# Patient Record
Sex: Female | Born: 1970 | Race: White | Hispanic: No | Smoking: Never smoker
Health system: Southern US, Community
[De-identification: ages and names within clinical notes are randomized; demographics above are authoritative.]

## PROBLEM LIST (undated history)

## (undated) DIAGNOSIS — F32A Depression, unspecified: Secondary | ICD-10-CM

## (undated) DIAGNOSIS — M1990 Unspecified osteoarthritis, unspecified site: Secondary | ICD-10-CM

## (undated) DIAGNOSIS — M797 Fibromyalgia: Secondary | ICD-10-CM

## (undated) DIAGNOSIS — K219 Gastro-esophageal reflux disease without esophagitis: Secondary | ICD-10-CM

## (undated) DIAGNOSIS — K589 Irritable bowel syndrome without diarrhea: Secondary | ICD-10-CM

## (undated) DIAGNOSIS — C50919 Malignant neoplasm of unspecified site of unspecified female breast: Secondary | ICD-10-CM

## (undated) DIAGNOSIS — R51 Headache: Secondary | ICD-10-CM

## (undated) DIAGNOSIS — R519 Headache, unspecified: Secondary | ICD-10-CM

## (undated) DIAGNOSIS — T7840XA Allergy, unspecified, initial encounter: Secondary | ICD-10-CM

## (undated) DIAGNOSIS — F329 Major depressive disorder, single episode, unspecified: Secondary | ICD-10-CM

## (undated) DIAGNOSIS — N942 Vaginismus: Secondary | ICD-10-CM

## (undated) DIAGNOSIS — F419 Anxiety disorder, unspecified: Secondary | ICD-10-CM

## (undated) DIAGNOSIS — G8929 Other chronic pain: Secondary | ICD-10-CM

## (undated) HISTORY — PX: ENDOMETRIAL ABLATION: SHX621

## (undated) HISTORY — DX: Irritable bowel syndrome, unspecified: K58.9

## (undated) HISTORY — DX: Depression, unspecified: F32.A

## (undated) HISTORY — DX: Major depressive disorder, single episode, unspecified: F32.9

## (undated) HISTORY — DX: Headache, unspecified: R51.9

## (undated) HISTORY — PX: TONSILLECTOMY: SHX5217

## (undated) HISTORY — DX: Fibromyalgia: M79.7

## (undated) HISTORY — DX: Unspecified osteoarthritis, unspecified site: M19.90

## (undated) HISTORY — PX: TUBAL LIGATION: SHX77

## (undated) HISTORY — PX: UPPER GASTROINTESTINAL ENDOSCOPY: SHX188

## (undated) HISTORY — PX: TONSILLECTOMY: SUR1361

## (undated) HISTORY — DX: Vaginismus: N94.2

## (undated) HISTORY — PX: ESOPHAGOGASTRODUODENOSCOPY: SHX1529

## (undated) HISTORY — DX: Anxiety disorder, unspecified: F41.9

## (undated) HISTORY — DX: Gastro-esophageal reflux disease without esophagitis: K21.9

## (undated) HISTORY — DX: Allergy, unspecified, initial encounter: T78.40XA

## (undated) HISTORY — DX: Headache: R51

## (undated) HISTORY — PX: ANKLE SURGERY: SHX546

## (undated) HISTORY — DX: Malignant neoplasm of unspecified site of unspecified female breast: C50.919

## (undated) HISTORY — DX: Other chronic pain: G89.29

---

## 2004-07-25 DIAGNOSIS — M797 Fibromyalgia: Secondary | ICD-10-CM

## 2004-07-25 HISTORY — DX: Fibromyalgia: M79.7

## 2005-11-01 ENCOUNTER — Ambulatory Visit (HOSPITAL_COMMUNITY): Admission: RE | Admit: 2005-11-01 | Discharge: 2005-11-01 | Payer: Self-pay | Admitting: Orthopedic Surgery

## 2009-04-03 ENCOUNTER — Encounter: Payer: Self-pay | Admitting: Family Medicine

## 2009-10-05 ENCOUNTER — Encounter: Payer: Self-pay | Admitting: Family Medicine

## 2009-12-11 ENCOUNTER — Encounter: Payer: Self-pay | Admitting: Family Medicine

## 2010-01-13 ENCOUNTER — Ambulatory Visit: Payer: Self-pay | Admitting: Family Medicine

## 2010-01-13 DIAGNOSIS — M797 Fibromyalgia: Secondary | ICD-10-CM

## 2010-01-13 DIAGNOSIS — K219 Gastro-esophageal reflux disease without esophagitis: Secondary | ICD-10-CM

## 2010-01-20 ENCOUNTER — Telehealth: Payer: Self-pay | Admitting: Family Medicine

## 2010-01-20 DIAGNOSIS — K3189 Other diseases of stomach and duodenum: Secondary | ICD-10-CM

## 2010-01-20 DIAGNOSIS — R1013 Epigastric pain: Secondary | ICD-10-CM

## 2010-01-21 ENCOUNTER — Encounter: Payer: Self-pay | Admitting: Family Medicine

## 2010-01-22 ENCOUNTER — Telehealth: Payer: Self-pay | Admitting: Family Medicine

## 2010-01-22 LAB — CONVERTED CEMR LAB
AST: 16 units/L (ref 0–37)
Alkaline Phosphatase: 41 units/L (ref 39–117)
BUN: 10 mg/dL (ref 6–23)
Basophils Absolute: 0 10*3/uL (ref 0.0–0.1)
Basophils Relative: 1 % (ref 0–1)
CO2: 25 meq/L (ref 19–32)
Chloride: 103 meq/L (ref 96–112)
Eosinophils Absolute: 0.1 10*3/uL (ref 0.0–0.7)
HCT: 41 % (ref 36.0–46.0)
Lipase: 32 units/L (ref 0–75)
Lymphocytes Relative: 29 % (ref 12–46)
Lymphs Abs: 1.4 10*3/uL (ref 0.7–4.0)
Monocytes Absolute: 0.6 10*3/uL (ref 0.1–1.0)
Monocytes Relative: 11 % (ref 3–12)
Sodium: 138 meq/L (ref 135–145)
Total Bilirubin: 0.4 mg/dL (ref 0.3–1.2)
WBC: 4.9 10*3/uL (ref 4.0–10.5)

## 2010-02-03 ENCOUNTER — Encounter: Payer: Self-pay | Admitting: Family Medicine

## 2010-02-16 ENCOUNTER — Telehealth: Payer: Self-pay | Admitting: Family Medicine

## 2010-04-12 ENCOUNTER — Ambulatory Visit: Payer: Self-pay | Admitting: Family Medicine

## 2010-04-28 ENCOUNTER — Telehealth (INDEPENDENT_AMBULATORY_CARE_PROVIDER_SITE_OTHER): Payer: Self-pay | Admitting: *Deleted

## 2010-05-21 ENCOUNTER — Ambulatory Visit: Payer: Self-pay | Admitting: Family Medicine

## 2010-05-21 DIAGNOSIS — J45909 Unspecified asthma, uncomplicated: Secondary | ICD-10-CM | POA: Insufficient documentation

## 2010-05-21 DIAGNOSIS — J209 Acute bronchitis, unspecified: Secondary | ICD-10-CM

## 2010-05-28 ENCOUNTER — Ambulatory Visit: Payer: Self-pay | Admitting: Family Medicine

## 2010-05-28 DIAGNOSIS — J0100 Acute maxillary sinusitis, unspecified: Secondary | ICD-10-CM

## 2010-06-29 ENCOUNTER — Ambulatory Visit: Payer: Self-pay | Admitting: Family Medicine

## 2010-06-29 DIAGNOSIS — J45909 Unspecified asthma, uncomplicated: Secondary | ICD-10-CM | POA: Insufficient documentation

## 2010-08-23 ENCOUNTER — Telehealth (INDEPENDENT_AMBULATORY_CARE_PROVIDER_SITE_OTHER): Payer: Self-pay | Admitting: *Deleted

## 2010-08-24 NOTE — Letter (Signed)
Summary: Page 3 & 4 of 5/High [facility]  Page 3 & 4 of 5/High [facility]   Imported By: Lanelle Bal 01/27/2010 10:23:46  _____________________________________________________________________  External Attachment:    Type:   Image     Comment:   External Document

## 2010-08-24 NOTE — Assessment & Plan Note (Signed)
Summary: NOV fibromyalgia   Vital Signs:  Patient profile:   40 year old female Height:      64 inches Weight:      127 pounds BMI:     21.88 O2 Sat:      100 % on Room air Pulse rate:   91 / minute BP sitting:   102 / 65  (left arm) Cuff size:   regular  Vitals Entered By: Payton Spark CMA (January 13, 2010 1:48 PM)  O2 Flow:  Room air CC: New to est. Discuss fibromyalgia and acid reflux.    Primary Care Provider:  Seymour Bars DO  CC:  New to est. Discuss fibromyalgia and acid reflux. Marland Kitchen  History of Present Illness: 40 yo WF presents for NOV.  She has a hx of fibromyalga since 2006.  She is G3P2-- sees Dr Rito Ehrlich.  She had an enodmetrial ablation and a BTL.    She is still having muscle pain and fatigue.  Cymbalta 90 mg has helped her depression.  She has muscle pain from head to toe.  She is getting some sleep disruption with young kids.  She has tried Tylenol and Ibuprofen but it does not help.  In the past, she has seen a chiropractor, it has helped.  She tries to walk but has no time due to kids's schedules.    Current Medications (verified): 1)  Trazodone Hcl 100 Mg Tabs (Trazodone Hcl) .... Take 1 Tab By Mouth Once Daily 2)  Clonazepam 1 Mg Tabs (Clonazepam) .... Take 1/2 To 1 Tab By Mouth Once Daily 3)  Zyrtec-D Allergy & Congestion 5-120 Mg Xr12h-Tab (Cetirizine-Pseudoephedrine) .... Take 1 Tab By Mouth Once Daily 4)  Lansoprazole 15 Mg Cpdr (Lansoprazole) .... Take 2 Tabs By Mouth Once Daily 5)  Cymbalta 30 Mg Cpep (Duloxetine Hcl) .... Take 3 Caps By Mouth Once Daily 6)  Excedrin Tension Headache 500-65 Mg Tabs (Acetaminophen-Caffeine) .... Take 2 Tabs By Mouth Once Daily 7)  Vitamin D3 5000 Unit/ml Liqd (Cholecalciferol) .... Take 1 Tab By Mouth Once Daily 8)  Calcium 600 Mg Tabs (Calcium) .... Take 3 Tab By Mouth Once Daily 9)  Pulmicort Flexhaler 180 Mcg/act Aepb (Budesonide) .... Take 4 Puffs Daily 10)  Xopenex 0.63 Mg/60ml Nebu (Levalbuterol Hcl) 11)   Fluticasone Propionate 50 Mcg/act Susp (Fluticasone Propionate) .Marland Kitchen.. 1 Spray Per Nostril Two Times A Day 12)  Vitamin B-12 100 Mcg Tabs (Cyanocobalamin)  Allergies (verified): 1)  ! Zithromax 2)  ! Ambien (Zolpidem Tartrate)  Past History:  Past Medical History: fibromyalgia G3P2  Past Surgical History: BTL endometrial ablation Dr Val EagleNathaneil Canary tonsillectomy LTCS x 2  Family History: mother depression, HTN father healthy 1 brother, 3 sisters healthy  Social History: Married.  2 daughters. [employer]- education. Never smoked. Exercises some. Denies ETOH.  Review of Systems       no fevers/sweats/weakness, unexplained wt loss/gain, no change in vision, no difficulty hearing, ringing in ears, + hay fever/allergies, no CP/discomfort, no palpitations, no breast lump/nipple discharge, no cough/wheeze, no blood in stool, no N/V/D, no nocturia, no leaking urine, no unusual vag bleeding, no vaginal/penile discharge, + muscle/joint pain, no rash, no new/changing mole, no HA, no memory loss, no anxiety, no sleep problem, no depression, no unexplained lumps, no easy bruising/bleeding, no concern with sexual function   Physical Exam  General:  alert, well-developed, well-nourished, and well-hydrated.   Head:  normocephalic and atraumatic.   Mouth:  good dentition and pharynx pink and moist.  Neck:  no masses.   Lungs:  Normal respiratory effort, chest expands symmetrically. Lungs are clear to auscultation, no crackles or wheezes. Heart:  Normal rate and regular rhythm. S1 and S2 normal without gallop, murmur, click, rub or other extra sounds. Msk:  no joint tenderness, no joint swelling, no joint warmth, and no redness over joints.   Extremities:  no E/C/C Skin:  color normal.   Psych:  good eye contact, not anxious appearing, and flat affect.     Impression & Recommendations:  Problem # 1:  FIBROMYALGIA (ICD-729.1) Continue Cymbalta 90 mg/ day.  Work on improving regular exercise.   Continue PT/chiropractic treatments as needed.  Add Skelaxin to Tylenol as needed for myalgias.  Discussed healthy diet and improving sleep as part of the treatment.  RTC in 3 mos for f/u Her updated medication list for this problem includes:    Excedrin Tension Headache 500-65 Mg Tabs (Acetaminophen-caffeine) .Marland Kitchen... Take 2 tabs by mouth once daily    Skelaxin 800 Mg Tabs (Metaxalone) .Marland Kitchen... 1 tab by mouth three times a day as needed muscle pain  Problem # 2:  GERD (ICD-530.81)  Her updated medication list for this problem includes:    Dexilant 60 Mg Cpdr (Dexlansoprazole) .Marland Kitchen... 1 capsule by mouth daily  Complete Medication List: 1)  Trazodone Hcl 100 Mg Tabs (Trazodone hcl) .... Take 1 tab by mouth once daily 2)  Clonazepam 1 Mg Tabs (Clonazepam) .... Take 1/2 to 1 tab by mouth once daily 3)  Zyrtec-d Allergy & Congestion 5-120 Mg Xr12h-tab (Cetirizine-pseudoephedrine) .... Take 1 tab by mouth once daily 4)  Dexilant 60 Mg Cpdr (Dexlansoprazole) .Marland Kitchen.. 1 capsule by mouth daily 5)  Cymbalta 30 Mg Cpep (Duloxetine hcl) .... Take 3 caps by mouth once daily 6)  Excedrin Tension Headache 500-65 Mg Tabs (Acetaminophen-caffeine) .... Take 2 tabs by mouth once daily 7)  Vitamin D3 5000 Unit/ml Liqd (Cholecalciferol) .... Take 1 tab by mouth once daily 8)  Calcium 600 Mg Tabs (Calcium) .... Take 3 tab by mouth once daily 9)  Pulmicort Flexhaler 180 Mcg/act Aepb (Budesonide) .... Take 4 puffs daily 10)  Xopenex 0.63 Mg/38ml Nebu (Levalbuterol hcl) 11)  Fluticasone Propionate 50 Mcg/act Susp (Fluticasone propionate) .Marland Kitchen.. 1 spray per nostril two times a day 12)  Vitamin B-12 100 Mcg Tabs (Cyanocobalamin) 13)  Skelaxin 800 Mg Tabs (Metaxalone) .Marland Kitchen.. 1 tab by mouth three times a day as needed muscle pain  Patient Instructions: 1)  For fibromyalgia, 2)  Stay on 90 mg of Cymbalta daily. 3)  Add Skelaxin ( non - sedating Muscle relaxer) up to 3 x a day for muscle pain. 4)  Use Tylenol Extra Strength up to  1000 mg 3 x a day as needed. 5)  Increase regular exercise to 30+ min most days/ wk. 6)  Change Lansoprazole to Dexilant for acid reflux. 7)  Return for f/u fibromyalgia in 3 mos. Prescriptions: DEXILANT 60 MG CPDR (DEXLANSOPRAZOLE) 1 capsule by mouth daily  #30 x 3   Entered and Authorized by:   Seymour Bars DO   Signed by:   Seymour Bars DO on 01/13/2010   Method used:   Electronically to        [facility] [phone]* (retail)       [address] [place], [place]  [address]       Ph: [phone] or [phone]       Fax: [phone]   RxID:  [id number] SKELAXIN 800 MG TABS (METAXALONE) 1 tab by mouth three times a day as needed muscle pain  #90 x 2   Entered and Authorized by:   Seymour Bars DO   Signed by:   Seymour Bars DO on 01/13/2010   Method used:   Electronically to        [facility] [phone]* (retail)       [address] [place], [place]  [address]       Ph: [phone] or [phone]       Fax: [phone]   RxID:   [id number]

## 2010-08-24 NOTE — Letter (Signed)
Summary: [facility]   Imported By: Lanelle Bal 01/27/2010 10:22:37  _____________________________________________________________________  External Attachment:    Type:   Image     Comment:   External Document

## 2010-08-24 NOTE — Assessment & Plan Note (Signed)
Summary: allergic asthma   Vital Signs:  Patient profile:   40 year old female Height:      64 inches Weight:      126 pounds BMI:     21.71 O2 Sat:      100 % on Room air Temp:     98.4 degrees F oral Pulse rate:   79 / minute BP sitting:   109 / 71  (left arm) Cuff size:   regular  Vitals Entered By: Payton Spark CMA (June 29, 2010 3:47 PM)  O2 Flow:  Room air  Serial Vital Signs/Assessments:                                PEF    PreRx  PostRx Time      O2 Sat  O2 Type     L/min  L/min  L/min   By 4:04 PM                       600    590    600     Payton Spark CMA  Comments: 4:04 PM Pt in green zone By: Payton Spark CMA   CC: ? congestion and cough.    Primary Care Provider:  Seymour Bars DO  CC:  ? congestion and cough. .  History of Present Illness: 40 yo WF presents for cough that has lingered after she was seen and treated for bronchitis a month ago.  She finished a full 10 day course of antibiotics and felt like she improved but her cough never did resolve.  She has a long hx of allergies - on Allegra D daily and asthma - on Pulmicort and using Albuterol HFA 4 x day with short term relief.  She is a Manufacturing systems engineer.  Her cough is mostly dry and not keeping her up at night.  Denies chest tightness, wheezing or SOB.  Denies any more rhinorrhea or sinus congestion.  She sees an allergist in [place].      Current Medications (verified): 1)  Trazodone Hcl 100 Mg Tabs (Trazodone Hcl) .... 2 Tabs By Mouth Qhs 2)  Allegra-D Allergy & Congestion 60-120 Mg Xr12h-Tab (Fexofenadine-Pseudoephedrine) .... Take One Tablet By Mouth Once A Day 3)  Lansoprazole 30 Mg Cpdr (Lansoprazole) .Marland Kitchen.. 1 Tab By Mouth Bid 4)  Cymbalta 30 Mg Cpep (Duloxetine Hcl) .... Take 3 Caps By Mouth Once Daily 5)  Vitamin D3 5000 Unit/ml Liqd (Cholecalciferol) .... Take 1 Tab By Mouth Once Daily 6)  Calcium 600 Mg Tabs (Calcium) .... Take 3 Tab By Mouth Once Daily 7)  Pulmicort Flexhaler 180  Mcg/act Aepb (Budesonide) .... Take 4 Puffs Daily 8)  Xopenex 0.63 Mg/44ml Nebu (Levalbuterol Hcl) 9)  Fluticasone Propionate 50 Mcg/act Susp (Fluticasone Propionate) .Marland Kitchen.. 1 Spray Per Nostril Two Times A Day 10)  Vitamin B-12 100 Mcg Tabs (Cyanocobalamin) 11)  Miralax  Powd (Polyethylene Glycol 3350) .... Use As Directed Per Bottle 12)  Promethazine-Codeine 6.25-10 Mg/106ml Syrp (Promethazine-Codeine) .... 5 Ml By Mouth Q 6 Hrs As Needed Cough 13)  Amoxicillin 875 Mg Tabs (Amoxicillin) .Marland Kitchen.. 1 Tab By Mouth Two Times A Day X 10 Days; Take With Food  Allergies (verified): 1)  ! Zithromax 2)  ! Ambien (Zolpidem Tartrate)  Past History:  Past Medical History: fibromyalgia G3P2 allergies/ asthma  allergist in [place]  Past Surgical History: Reviewed history from 01/13/2010 and no  changes required. BTL endometrial ablation Dr Val Eagle' Nathaneil Canary tonsillectomy LTCS x 2  Social History: Reviewed history from 04/12/2010 and no changes required. Married.  2 daughters. [employer]- education- teaching.  Never smoked. Exercises some. Denies ETOH.  Review of Systems      See HPI  Physical Exam  General:  alert, well-developed, well-nourished, and well-hydrated.   Head:  normocephalic and atraumatic.  sinuses NTTP Eyes:  conjunctiva clear, slightly watery Ears:  EACs patent; TMs translucent and gray with good cone of light and bony landmarks.  Nose:  scant clear rhinorrhea Mouth:  o/p clear with postnasal drainage Neck:  no masses.   Lungs:  Normal respiratory effort, chest expands symmetrically. Lungs are clear to auscultation, no crackles or wheezes.  dry cough Heart:  Normal rate and regular rhythm. S1 and S2 normal without gallop, murmur, click, rub or other extra sounds. Skin:  color normal.   Cervical Nodes:  No lymphadenopathy noted   Impression & Recommendations:  Problem # 1:  ALLERGIC ASTHMA (ICD-493.00)  Continued cough after roiund of abx last month along with URI/ bronchitis.  She is  DUE to go back to her allergist -- she will call to schedule this.  Her lung exam is clear and her PFs are normal but she has a lot of postnasal drip even with use of Allegra D.  She is to stay on her current asthma meds but will add 5 days of Prednsione burst to see if this helps w/ her cough.  She can use her RX cough syrup at night. Her updated medication list for this problem includes:    Pulmicort Flexhaler 180 Mcg/act Aepb (Budesonide) .Marland Kitchen... Take 4 puffs daily    Xopenex 0.63 Mg/20ml Nebu (Levalbuterol hcl)    Prednisone 20 Mg Tabs (Prednisone) .Marland Kitchen... 2 tabs by mouth once daily x 5 days  Orders: Peak Flow Rate (94150)  Complete Medication List: 1)  Trazodone Hcl 100 Mg Tabs (Trazodone hcl) .... 2 tabs by mouth qhs 2)  Allegra-d Allergy & Congestion 60-120 Mg Xr12h-tab (Fexofenadine-pseudoephedrine) .... Take one tablet by mouth once a day 3)  Lansoprazole 30 Mg Cpdr (Lansoprazole) .Marland Kitchen.. 1 tab by mouth bid 4)  Cymbalta 30 Mg Cpep (Duloxetine hcl) .... Take 3 caps by mouth once daily 5)  Vitamin D3 5000 Unit/ml Liqd (Cholecalciferol) .... Take 1 tab by mouth once daily 6)  Calcium 600 Mg Tabs (Calcium) .... Take 3 tab by mouth once daily 7)  Pulmicort Flexhaler 180 Mcg/act Aepb (Budesonide) .... Take 4 puffs daily 8)  Xopenex 0.63 Mg/40ml Nebu (Levalbuterol hcl) 9)  Fluticasone Propionate 50 Mcg/act Susp (Fluticasone propionate) .Marland Kitchen.. 1 spray per nostril two times a day 10)  Vitamin B-12 100 Mcg Tabs (Cyanocobalamin) 11)  Miralax Powd (Polyethylene glycol 3350) .... Use as directed per bottle 12)  Promethazine-codeine 6.25-10 Mg/59ml Syrp (Promethazine-codeine) .... 5 ml by mouth q 6 hrs as needed cough 13)  Prednisone 20 Mg Tabs (Prednisone) .... 2 tabs by mouth once daily x 5 days  Patient Instructions: 1)  Will treat allergic asthma with 5 days of Prednisone 2 tabs once daily. 2)  Stay on Pulmicort + rescue inhaler. 3)  Will get you back in with your allergist in the next  week. Prescriptions: PREDNISONE 20 MG TABS (PREDNISONE) 2 tabs by mouth once daily x 5 days  #10 x 0   Entered and Authorized by:   Seymour Bars DO   Signed by:   Seymour Bars DO on 06/29/2010   Method  used:   Electronically to        [facility]* (retail)       [address]       [place], [place]  [address]       Ph: [phone]       Fax: [phone]   RxID:   [id number] PROMETHAZINE-CODEINE 6.25-10 MG/5ML SYRP (PROMETHAZINE-CODEINE) 5 ml by mouth q 6 hrs as needed cough  #120 ml x 0   Entered and Authorized by:   Seymour Bars DO   Signed by:   Seymour Bars DO on 06/29/2010   Method used:   Printed then faxed to ...       [facility]* (retail)       [address]       [place], [place]  [address]       Ph: [phone]       Fax: [phone]   RxID:   [id number]    Orders Added: 1)  Est. Patient Level III [32355] 2)  Peak Flow Rate [94150]

## 2010-08-24 NOTE — Assessment & Plan Note (Signed)
Summary: BRONCHITIS   Vital Signs:  Patient profile:   40 year old female Height:      64 inches Weight:      126 pounds Temp:     98.2 degrees F oral Pulse rate:   75 / minute BP sitting:   110 / 74  (right arm) Cuff size:   regular  Vitals Entered By: Avon Gully CMA, Duncan Dull) (May 21, 2010 10:47 AM)  Serial Vital Signs/Assessments:  Comments: 10:50 AM pre med peak flows 500, 450, 480 By: Avon Gully CMA, (AAMA)  10:50 AM pt is in the green By: Avon Gully CMA, (AAMA)   CC: chest feels tight,coughing until ribs hurt, getting worse over the past week   Primary Care Provider:  Seymour Bars DO  CC:  chest feels tight, coughing until ribs hurt, and getting worse over the past week.  History of Present Illness: chest feels tight,coughing until ribs hurt, getting worse over the past week. . Started a week a fever.  Started with ST.  No fever. Nasal drainage only for 2 days.  Hurts to breath. Chest feels itchy and sore.  No sig nasal congestion.  Using mucinex and robitussin for cough supresssion. Hx of asthma. Using her rescue inhaler 2-3 x a day.    Current Medications (verified): 1)  Trazodone Hcl 100 Mg Tabs (Trazodone Hcl) .... 2 Tabs By Mouth Qhs 2)  Allegra-D Allergy & Congestion 60-120 Mg Xr12h-Tab (Fexofenadine-Pseudoephedrine) .... Take One Tablet By Mouth Once A Day 3)  Lansoprazole 30 Mg Cpdr (Lansoprazole) .Marland Kitchen.. 1 Tab By Mouth Bid 4)  Cymbalta 30 Mg Cpep (Duloxetine Hcl) .... Take 3 Caps By Mouth Once Daily 5)  Vitamin D3 5000 Unit/ml Liqd (Cholecalciferol) .... Take 1 Tab By Mouth Once Daily 6)  Calcium 600 Mg Tabs (Calcium) .... Take 3 Tab By Mouth Once Daily 7)  Pulmicort Flexhaler 180 Mcg/act Aepb (Budesonide) .... Take 4 Puffs Daily 8)  Xopenex 0.63 Mg/83ml Nebu (Levalbuterol Hcl) 9)  Fluticasone Propionate 50 Mcg/act Susp (Fluticasone Propionate) .Marland Kitchen.. 1 Spray Per Nostril Two Times A Day 10)  Vitamin B-12 100 Mcg Tabs (Cyanocobalamin) 11)   Miralax  Powd (Polyethylene Glycol 3350) .... Use As Directed Per Bottle  Allergies (verified): 1)  ! Zithromax 2)  ! Ambien (Zolpidem Tartrate)  Comments:  Nurse/Medical Assistant: The patient's medications and allergies were reviewed with the patient and were updated in the Medication and Allergy Lists. Avon Gully CMA, Duncan Dull) (May 21, 2010 10:49 AM)  Physical Exam  General:  Well-developed,well-nourished,in no acute distress; alert,appropriate and cooperative throughout examination Head:  Normocephalic and atraumatic without obvious abnormalities. No apparent alopecia or balding. Eyes:  No corneal or conjunctival inflammation noted. EOMI. Perrla.  Ears:  External ear exam shows no significant lesions or deformities.  Otoscopic examination reveals clear canals, tympanic membranes are intact bilaterally without bulging, retraction, inflammation or discharge. Hearing is grossly normal bilaterally. Nose:  External nasal examination shows no deformity or inflammation. Nasal mucosa are pink and moist without lesions or exudates. Mouth:  Oral mucosa and oropharynx without lesions or exudates.  Teeth in good repair. Neck:  No deformities, masses, or tenderness noted.  Lungs:  Normal respiratory effort, chest expands symmetrically. Lungs are clear to auscultation, no crackles or wheezes. Cough is dry.   Heart:  Normal rate and regular rhythm. S1 and S2 normal without gallop, murmur, click, rub or other extra sounds. Pulses:  Radial 2+  Neurologic:  alert & oriented X3.   Skin:  no  rashes.   Cervical Nodes:  No lymphadenopathy noted   Impression & Recommendations:  Problem # 1:  ACUTE BRONCHITIS (ICD-466.0)  Her updated medication list for this problem includes:    Allegra-d Allergy & Congestion 60-120 Mg Xr12h-tab (Fexofenadine-pseudoephedrine) .Marland Kitchen... Take one tablet by mouth once a day    Pulmicort Flexhaler 180 Mcg/act Aepb (Budesonide) .Marland Kitchen... Take 4 puffs daily    Xopenex 0.63  Mg/78ml Nebu (Levalbuterol hcl)    Hydrocodone-homatropine 5-1.5 Mg/70ml Syrp (Hydrocodone-homatropine) .Marland KitchenMarland KitchenMarland KitchenMarland Kitchen 5ml by mouth at bedtime as needed cough  Discussed likely viral.  Tx is inhalers and time. call if gtting worse. Will add the steroids.  Encouraged to push clear liquids, get enough rest, and take acetaminophen as needed. To be seen in 5-7 days if no improvement, sooner if worse. Cough med to treat the cough at bedtime.   Problem # 2:  ASTHMA (ICD-493.90)  Trial of the dulera. If makes her too shakey like the Advair and symbicort do then can restart he pulmicort and start with the xopenex qid or three times a day and wean down as feeling better.  Her updated medication list for this problem includes:    Pulmicort Flexhaler 180 Mcg/act Aepb (Budesonide) .Marland Kitchen... Take 4 puffs daily    Xopenex 0.63 Mg/29ml Nebu (Levalbuterol hcl)    Prednisone 20 Mg Tabs (Prednisone) .Marland Kitchen... 2 tabs by mouth daily for 5 days then once daily for 5 days.  Orders: Peak Flow Rate (94150) Peak Flow Meter (Z6109)  Complete Medication List: 1)  Trazodone Hcl 100 Mg Tabs (Trazodone hcl) .... 2 tabs by mouth qhs 2)  Allegra-d Allergy & Congestion 60-120 Mg Xr12h-tab (Fexofenadine-pseudoephedrine) .... Take one tablet by mouth once a day 3)  Lansoprazole 30 Mg Cpdr (Lansoprazole) .Marland Kitchen.. 1 tab by mouth bid 4)  Cymbalta 30 Mg Cpep (Duloxetine hcl) .... Take 3 caps by mouth once daily 5)  Vitamin D3 5000 Unit/ml Liqd (Cholecalciferol) .... Take 1 tab by mouth once daily 6)  Calcium 600 Mg Tabs (Calcium) .... Take 3 tab by mouth once daily 7)  Pulmicort Flexhaler 180 Mcg/act Aepb (Budesonide) .... Take 4 puffs daily 8)  Xopenex 0.63 Mg/70ml Nebu (Levalbuterol hcl) 9)  Fluticasone Propionate 50 Mcg/act Susp (Fluticasone propionate) .Marland Kitchen.. 1 spray per nostril two times a day 10)  Vitamin B-12 100 Mcg Tabs (Cyanocobalamin) 11)  Miralax Powd (Polyethylene glycol 3350) .... Use as directed per bottle 12)  Hydrocodone-homatropine  5-1.5 Mg/35ml Syrp (Hydrocodone-homatropine) .... 5ml by mouth at bedtime as needed cough 13)  Prednisone 20 Mg Tabs (Prednisone) .... 2 tabs by mouth daily for 5 days then once daily for 5 days.  Patient Instructions: 1)  Increase albutreol inhaler to three times a day with your pulmicort or try the dulera. Will add steroids as well 2)  Call if not getting better in 5-7 days.  Prescriptions: PREDNISONE 20 MG TABS (PREDNISONE) 2 tabs by mouth daily for 5 days then once daily for 5 days.  #15 x 0   Entered and Authorized by:   Nani Gasser MD   Signed by:   Nani Gasser MD on 05/21/2010   Method used:   Printed then faxed to ...       [facility]* (retail)       [address]       [place], [place]  [address]       Ph: [phone]       Fax: [phone]   RxID:   [id number] HYDROCODONE-HOMATROPINE 5-1.5  MG/5ML SYRP (HYDROCODONE-HOMATROPINE) 5ml by mouth at bedtime as needed cough  #175ml x 0   Entered and Authorized by:   Nani Gasser MD   Signed by:   Nani Gasser MD on 05/21/2010   Method used:   Printed then faxed to ...       [facility]* (retail)       [address]       [place], [place]  [address]       Ph: [phone]       Fax: [phone]   RxID:   [id number]    Orders Added: 1)  Est. Patient Level IV [84132] 2)  Peak Flow Rate [94150] 3)  Peak Flow Meter [A4614]  Appended Document: BRONCHITIS PE: mild anterior tender cerv LN.

## 2010-08-24 NOTE — Assessment & Plan Note (Signed)
Summary: sinusitis/ asthma   Vital Signs:  Patient profile:   40 year old female Height:      64 inches Weight:      126 pounds BMI:     21.71 O2 Sat:      99 % on Room air Temp:     98.5 degrees F oral Pulse rate:   83 / minute BP sitting:   116 / 66  (left arm) Cuff size:   regular  Vitals Entered By: Payton Spark CMA (May 28, 2010 1:56 PM)  O2 Flow:  Room air CC: Bronchitis and sinusitis.    Primary Care Provider:  Seymour Bars DO  CC:  Bronchitis and sinusitis. Marland Kitchen  History of Present Illness: 40 yo WF presents for post viral cough with hx of asthma.  She completed her steroids and was too shakey on Dulera so is back on Pulmicort with use of Xopenex 3 x a day.  She has yearlong allergies, on Allegra D everyday, year- long.  Her cough is still dry and hacking.  Has chest pain with cough.  Has more head congestion.  No fevers or chills.  energy level is improving.  She has used robitussin during the day and Hycodan at night -- not really working well for her es/p during the day.    She denies chest tightness or SOB other than with cough.  Has started to feel a little better but has sinus pressure esp when bending over and has increased PND.   Current Medications (verified): 1)  Trazodone Hcl 100 Mg Tabs (Trazodone Hcl) .... 2 Tabs By Mouth Qhs 2)  Allegra-D Allergy & Congestion 60-120 Mg Xr12h-Tab (Fexofenadine-Pseudoephedrine) .... Take One Tablet By Mouth Once A Day 3)  Lansoprazole 30 Mg Cpdr (Lansoprazole) .Marland Kitchen.. 1 Tab By Mouth Bid 4)  Cymbalta 30 Mg Cpep (Duloxetine Hcl) .... Take 3 Caps By Mouth Once Daily 5)  Vitamin D3 5000 Unit/ml Liqd (Cholecalciferol) .... Take 1 Tab By Mouth Once Daily 6)  Calcium 600 Mg Tabs (Calcium) .... Take 3 Tab By Mouth Once Daily 7)  Pulmicort Flexhaler 180 Mcg/act Aepb (Budesonide) .... Take 4 Puffs Daily 8)  Xopenex 0.63 Mg/32ml Nebu (Levalbuterol Hcl) 9)  Fluticasone Propionate 50 Mcg/act Susp (Fluticasone Propionate) .Marland Kitchen.. 1 Spray  Per Nostril Two Times A Day 10)  Vitamin B-12 100 Mcg Tabs (Cyanocobalamin) 11)  Miralax  Powd (Polyethylene Glycol 3350) .... Use As Directed Per Bottle 12)  Hydrocodone-Homatropine 5-1.5 Mg/20ml Syrp (Hydrocodone-Homatropine) .... 5ml By Mouth At Bedtime As Needed Cough 13)  Prednisone 20 Mg Tabs (Prednisone) .... 2 Tabs By Mouth Daily For 5 Days Then Once Daily For 5 Days.  Allergies (verified): 1)  ! Zithromax 2)  ! Ambien (Zolpidem Tartrate)  Past History:  Past Medical History: Reviewed history from 01/13/2010 and no changes required. fibromyalgia G3P2  Past Surgical History: Reviewed history from 01/13/2010 and no changes required. BTL endometrial ablation Dr Val Eagle' Nathaneil Canary tonsillectomy LTCS x 2  Social History: Reviewed history from 04/12/2010 and no changes required. Married.  2 daughters. [employer]- education- teaching.  Never smoked. Exercises some. Denies ETOH.  Review of Systems      See HPI  Physical Exam  General:  alert, well-developed, well-nourished, and well-hydrated.   Head:  normocephalic and atraumatic.  maxillary sinuses TTP Eyes:  conjunctiva clear Ears:  EACs patent; TMs translucent and gray with good cone of light and bony landmarks.  Nose:  nasal congestion with boggy turbinates clear  Mouth:  o/p  mildly injected Neck:  no masses.   Lungs:  Normal respiratory effort, chest expands symmetrically. Lungs are clear to auscultation, no crackles or wheezes.  dry hacking cough Heart:  Normal rate and regular rhythm. S1 and S2 normal without gallop, murmur, click, rub or other extra sounds. Skin:  color normal.   Cervical Nodes:  shotty tender submandibular LA   Impression & Recommendations:  Problem # 1:  ACUTE MAXILLARY SINUSITIS (ICD-461.0) Treat with 10 days of Amoxicillin + supportve care.  The following medications were removed from the medication list:    Hydrocodone-homatropine 5-1.5 Mg/35ml Syrp (Hydrocodone-homatropine) .Marland KitchenMarland KitchenMarland KitchenMarland Kitchen 5ml by mouth at  bedtime as needed cough Her updated medication list for this problem includes:    Allegra-d Allergy & Congestion 60-120 Mg Xr12h-tab (Fexofenadine-pseudoephedrine) .Marland Kitchen... Take one tablet by mouth once a day    Fluticasone Propionate 50 Mcg/act Susp (Fluticasone propionate) .Marland Kitchen... 1 spray per nostril two times a day    Promethazine-codeine 6.25-10 Mg/82ml Syrp (Promethazine-codeine) .Marland KitchenMarland KitchenMarland KitchenMarland Kitchen 5 ml by mouth q 6 hrs as needed cough    Amoxicillin 875 Mg Tabs (Amoxicillin) .Marland Kitchen... 1 tab by mouth two times a day x 10 days; take with food  Problem # 2:  ASTHMA (ICD-493.90) Still having dry hacking cough secondary to inital viral infection. Will continue her on her routine asthma meds, adding RX cough syrup day and night, cautioned about sedation. Call if not improving/ or getting worse. The following medications were removed from the medication list:    Prednisone 20 Mg Tabs (Prednisone) .Marland Kitchen... 2 tabs by mouth daily for 5 days then once daily for 5 days. Her updated medication list for this problem includes:    Pulmicort Flexhaler 180 Mcg/act Aepb (Budesonide) .Marland Kitchen... Take 4 puffs daily    Xopenex 0.63 Mg/74ml Nebu (Levalbuterol hcl)  Complete Medication List: 1)  Trazodone Hcl 100 Mg Tabs (Trazodone hcl) .... 2 tabs by mouth qhs 2)  Allegra-d Allergy & Congestion 60-120 Mg Xr12h-tab (Fexofenadine-pseudoephedrine) .... Take one tablet by mouth once a day 3)  Lansoprazole 30 Mg Cpdr (Lansoprazole) .Marland Kitchen.. 1 tab by mouth bid 4)  Cymbalta 30 Mg Cpep (Duloxetine hcl) .... Take 3 caps by mouth once daily 5)  Vitamin D3 5000 Unit/ml Liqd (Cholecalciferol) .... Take 1 tab by mouth once daily 6)  Calcium 600 Mg Tabs (Calcium) .... Take 3 tab by mouth once daily 7)  Pulmicort Flexhaler 180 Mcg/act Aepb (Budesonide) .... Take 4 puffs daily 8)  Xopenex 0.63 Mg/20ml Nebu (Levalbuterol hcl) 9)  Fluticasone Propionate 50 Mcg/act Susp (Fluticasone propionate) .Marland Kitchen.. 1 spray per nostril two times a day 10)  Vitamin B-12 100 Mcg  Tabs (Cyanocobalamin) 11)  Miralax Powd (Polyethylene glycol 3350) .... Use as directed per bottle 12)  Promethazine-codeine 6.25-10 Mg/62ml Syrp (Promethazine-codeine) .... 5 ml by mouth q 6 hrs as needed cough 13)  Amoxicillin 875 Mg Tabs (Amoxicillin) .Marland Kitchen.. 1 tab by mouth two times a day x 10 days; take with food  Patient Instructions: 1)  Take Amoxicillin x 10 days for sinusitis. 2)  Stay on asthma meds and allergy meds. 3)  Use new cough syrup day and night but caution about sedation. 4)  Call if not resolved after 10 days. Prescriptions: AMOXICILLIN 875 MG TABS (AMOXICILLIN) 1 tab by mouth two times a day x 10 days; take with food  #20 x 0   Entered and Authorized by:   Seymour Bars DO   Signed by:   Seymour Bars DO on 05/28/2010   Method used:  Electronically to        [facility]* (retail)       [address]       [place], [place]  [address]       Ph: [phone]       Fax: [phone]   RxID:   [id number] AMOXICILLIN 875 MG TABS (AMOXICILLIN) 1 tab by mouth two times a day x 10 days; take with food  #20 x 0   Entered and Authorized by:   Seymour Bars DO   Signed by:   Seymour Bars DO on 05/28/2010   Method used:   [facility]ronically to        [facility] [phone]* (retail)       [address] [place], [place]  [address]       Ph: [phone] or [phone]       Fax: [phone]   RxID:   [id number] PROMETHAZINE-CODEINE 6.25-10 MG/5ML SYRP (PROMETHAZINE-CODEINE) 5 ml by mouth q 6 hrs as needed cough  #120 ml x 0   Entered and Authorized by:   Seymour Bars DO   Signed by:   Seymour Bars DO on 05/28/2010   Method used:   Printed then faxed to ...       [facility]  [place] [phone]* (retail)       [address] [place], [place]  [address]       Ph: [phone] or [phone]       Fax: [phone]   RxID:   [id number]    Orders Added: 1)  Est. Patient Level III [69678]

## 2010-08-24 NOTE — Progress Notes (Signed)
Summary: pt wants test results  Phone Note Call from Patient   Caller: Patient Summary of Call: pt. called and wants to know her test results and would like a call back...  Thanks,.Jennifer Rich  January 22, 2010 11:56 AM  Initial call taken by: Michaelle Copas,  January 22, 2010 11:56 AM  Follow-up for Phone Call        Pt notified of lab results Follow-up by: Kathlene November,  January 22, 2010 12:01 PM

## 2010-08-24 NOTE — Assessment & Plan Note (Signed)
Summary: f/u fibromyalgia   Vital Signs:  Patient profile:   40 year old female Height:      64 inches Weight:      129 pounds BMI:     22.22 O2 Sat:      100 % on Room air Pulse rate:   89 / minute BP sitting:   108 / 68  (left arm) Cuff size:   regular  Vitals Entered By: Payton Spark CMA (April 12, 2010 3:52 PM)  O2 Flow:  Room air CC: F/U fibromyalgia. Med refills   Primary Care Provider:  Seymour Bars DO  CC:  F/U fibromyalgia. Med refills.  History of Present Illness: 40 yo WF presents for f/u fibromyalgia.  She is back to work and now has a free [employer] so plans to start exercise.  She is still on Cymbalta 90 mg/ day.  Her mood has been pretty normal.  Her sleep has improved with trazadone.  She plans to start going back to the chiropractor for the pain in her back.  She is currently off NSAIDS and skelaxin.    Current Medications (verified): 1)  Trazodone Hcl 100 Mg Tabs (Trazodone Hcl) .... Take 1 Tab By Mouth Once Daily 2)  Zyrtec-D Allergy & Congestion 5-120 Mg Xr12h-Tab (Cetirizine-Pseudoephedrine) .... Take 1 Tab By Mouth Once Daily 3)  Lansoprazole 30 Mg Cpdr (Lansoprazole) .Marland Kitchen.. 1 Tab By Mouth Bid 4)  Cymbalta 30 Mg Cpep (Duloxetine Hcl) .... Take 3 Caps By Mouth Once Daily 5)  Vitamin D3 5000 Unit/ml Liqd (Cholecalciferol) .... Take 1 Tab By Mouth Once Daily 6)  Calcium 600 Mg Tabs (Calcium) .... Take 3 Tab By Mouth Once Daily 7)  Pulmicort Flexhaler 180 Mcg/act Aepb (Budesonide) .... Take 4 Puffs Daily 8)  Xopenex 0.63 Mg/84ml Nebu (Levalbuterol Hcl) 9)  Fluticasone Propionate 50 Mcg/act Susp (Fluticasone Propionate) .Marland Kitchen.. 1 Spray Per Nostril Two Times A Day 10)  Vitamin B-12 100 Mcg Tabs (Cyanocobalamin)  Allergies (verified): 1)  ! Zithromax 2)  ! Ambien (Zolpidem Tartrate)  Past History:  Past Medical History: Reviewed history from 01/13/2010 and no changes required. fibromyalgia G3P2  Past Surgical History: Reviewed history from  01/13/2010 and no changes required. BTL endometrial ablation Dr Val EagleNathaneil Canary tonsillectomy LTCS x 2  Family History: Reviewed history from 01/13/2010 and no changes required. mother depression, HTN father healthy 1 brother, 3 sisters healthy  Social History: Reviewed history from 01/13/2010 and no changes required. Married.  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  Never smoked. Exercises some. Denies ETOH.  Review of Systems      See HPI  Physical Exam  General:  alert, well-developed, well-nourished, and well-hydrated.   Head:  normocephalic and atraumatic.   Mouth:  pharynx pink and moist.   Neck:  no masses.   Lungs:  Normal respiratory effort, chest expands symmetrically. Lungs are clear to auscultation, no crackles or wheezes. Msk:  tender across posterior traps Extremities:  no LE edema Skin:  color normal.   Psych:  good eye contact, not anxious appearing, and not depressed appearing.     Impression & Recommendations:  Problem # 1:  FIBROMYALGIA (ICD-729.1) Assessment Improved Improved without need for as needed meds.  Stable with cymbalta day and trazadone at night to improve sleep which has helped. Plan to add more exercise and add chiropractic manipulation for back pain.  RTC in 6 mos. The following medications were removed from the medication list:    Excedrin Tension Headache 500-65 Mg Tabs (Acetaminophen-caffeine) .Marland KitchenMarland KitchenMarland KitchenMarland Kitchen  Take 2 tabs by mouth once daily    Skelaxin 800 Mg Tabs (Metaxalone) .Marland Kitchen... 1 tab by mouth three times a day as needed muscle pain  Complete Medication List: 1)  Trazodone Hcl 100 Mg Tabs (Trazodone hcl) .... 2 tabs by mouth qhs 2)  Zyrtec-d Allergy & Congestion 5-120 Mg Xr12h-tab (Cetirizine-pseudoephedrine) .... Take 1 tab by mouth once daily 3)  Lansoprazole 30 Mg Cpdr (Lansoprazole) .Marland Kitchen.. 1 tab by mouth bid 4)  Cymbalta 30 Mg Cpep (Duloxetine hcl) .... Take 3 caps by mouth once daily 5)  Vitamin D3 5000 Unit/ml Liqd (Cholecalciferol)  .... Take 1 tab by mouth once daily 6)  Calcium 600 Mg Tabs (Calcium) .... Take 3 tab by mouth once daily 7)  Pulmicort Flexhaler 180 Mcg/act Aepb (Budesonide) .... Take 4 puffs daily 8)  Xopenex 0.63 Mg/58ml Nebu (Levalbuterol hcl) 9)  Fluticasone Propionate 50 Mcg/act Susp (Fluticasone propionate) .Marland Kitchen.. 1 spray per nostril two times a day 10)  Vitamin B-12 100 Mcg Tabs (Cyanocobalamin)  Other Orders: Admin 1st Vaccine (62130) Flu Vaccine 59yrs + (86578)  Patient Instructions: 1)  Keep up the good work. 2)  Add regular exercise and chiropractic manipulation. 3)  Stay on current meds. 4)  Call if any problems. 5)  F/U in 6 mos. Prescriptions: FLUTICASONE PROPIONATE 50 MCG/ACT SUSP (FLUTICASONE PROPIONATE) 1 spray per nostril two times a day  #1 bottle x 6   Entered and Authorized by:   Seymour Bars DO   Signed by:   Seymour Bars DO on 04/12/2010   Method used:   [facility]        [facility] [phone]* (retail)       [address] [place], [place]  [address]       Ph: [phone] or [phone]       Fax: [phone]   RxID:   [id number] TRAZODONE HCL 100 MG TABS (TRAZODONE HCL) 2 tabs by mouth qhs  #60 x 6   Entered and Authorized by:   Seymour Bars DO   Signed by:   Seymour Bars DO on 04/12/2010   Method used:   [facility]ron[facility] to        [facility] [phone]* (retail)       [address] [place], [place]  [address]       Ph: [phone] or [phone]       Fax: [phone]   RxID:   [id number]  Flu Vaccine Consent Questions     Do you have a history of severe allergic reactions to this vaccine? no    Any prior history of allergic reactions to egg and/or gelatin? no    Do you have a sensitivity to the preservative Thimersol? no    Do you have a past history of Guillan-Barre Syndrome? no    Do you currently have an acute febrile illness? no    Have you ever had a severe reaction to latex? no    Vaccine information given and explained to patient?  yes    Are you currently pregnant? no    Lot Number:[id number]   Exp Date:01/22/2011   Site Given  Left Deltoid [place], [place]  [address]       Ph: [phone] or [phone]       Fax: [phone]   RxID:   [id number]    .lbflu

## 2010-08-24 NOTE — Progress Notes (Signed)
Summary: changed PPI  Phone Note Call from Patient   Caller: Patient Summary of Call: Pt would like to know if she can have a RX for lansoprazole 30mg  two times a day since dexilant is not working. Please advise. Initial call taken by: Payton Spark CMA,  February 16, 2010 1:25 PM    New/Updated Medications: LANSOPRAZOLE 30 MG CPDR (LANSOPRAZOLE) 1 tab by mouth bid Prescriptions: LANSOPRAZOLE 30 MG CPDR (LANSOPRAZOLE) 1 tab by mouth bid  #60 x 3   Entered and Authorized by:   Seymour Bars DO   Signed by:   Seymour Bars DO on 02/16/2010   Method used:   Electronically to        [facility] [phone]* (retail)       [address] [place], [place]  [address]       Ph: [phone] or [phone]       Fax: [phone]   RxID:   [id number]   Appended Document: changed PPI Pt aware of the above

## 2010-08-24 NOTE — Progress Notes (Signed)
       New/Updated Medications: MIRALAX  POWD (POLYETHYLENE GLYCOL 3350) Use as directed per bottle Prescriptions: MIRALAX  POWD (POLYETHYLENE GLYCOL 3350) Use as directed per bottle  #1 bottle x 6   Entered by:   Payton Spark CMA   Authorized by:   Seymour Bars DO   Signed by:   Payton Spark CMA on 04/28/2010   Method used:   [facility]ically to        [facility] [phone]* (retail)       [address] [place], [place]  [address]       Ph: [phone] or [phone]       Fax: [phone]   RxID:   [id number]

## 2010-08-24 NOTE — Letter (Signed)
Summary: Patient Cancellation/[facility]  Patient Cancellation/[facility]   Imported By: Lanelle Bal 02/15/2010 08:21:55  _____________________________________________________________________  External Attachment:    Type:   Image     Comment:   External Document

## 2010-08-24 NOTE — Progress Notes (Signed)
Summary: Dexilant ?  Phone Note Call from Patient   Caller: Patient Summary of Call: Pt states she has been taking Dexilant for 5 days and has not gotten any relief from it. Pt states she was previously taking lansoprazole 30mg  two times a day. Pt would like to know what else she can try that will be stronger and offer more relief.  Initial call taken by: Payton Spark CMA,  January 20, 2010 1:38 PM  Follow-up for Phone Call        if not responding to high potency Dexilant, let's get labs to check gall bladder -- other causes for dyspepsia.    Pls print out lab order. Follow-up by: Seymour Bars DO,  January 20, 2010 4:26 PM  New Problems: DYSPEPSIA (ICD-536.8)   New Problems: DYSPEPSIA (ICD-536.8)  Appended Document: Dexilant ? Pt aware of the above

## 2010-09-01 NOTE — Progress Notes (Signed)
  Phone Note Call from Patient Call back at [phone]   Caller: Patient Call For: Tara Bars DO Reason for Call: Referral Summary of Call: pt was unable to go to last GI appt.  Needs new referral. Initial call taken by: Francee Piccolo CMA Duncan Dull),  August 23, 2010 2:26 PM  Follow-up for Phone Call        [facility] stating Pt can call and reschedule [facility] apt [facility].  Follow-up by: Payton Spark CMA,  August 23, 2010 2:33 PM

## 2010-09-08 ENCOUNTER — Encounter: Payer: Self-pay | Admitting: Family Medicine

## 2010-10-01 ENCOUNTER — Ambulatory Visit (INDEPENDENT_AMBULATORY_CARE_PROVIDER_SITE_OTHER): Payer: BC Managed Care – PPO | Admitting: Family Medicine

## 2010-10-01 ENCOUNTER — Encounter: Payer: Self-pay | Admitting: Family Medicine

## 2010-10-01 DIAGNOSIS — K589 Irritable bowel syndrome without diarrhea: Secondary | ICD-10-CM | POA: Insufficient documentation

## 2010-10-01 DIAGNOSIS — IMO0001 Reserved for inherently not codable concepts without codable children: Secondary | ICD-10-CM

## 2010-10-12 ENCOUNTER — Ambulatory Visit: Payer: Self-pay | Admitting: Family Medicine

## 2010-10-12 NOTE — Assessment & Plan Note (Signed)
Summary: fibromyalgia/ IBS   Vital Signs:  Patient profile:   40 year old female Height:      64 inches Weight:      124.25 pounds BMI:     21.40 O2 Sat:      100 % on Room air Pulse rate:   92 / minute BP sitting:   114 / 68  (right arm) Cuff size:   regular  Vitals Entered By: Francee Piccolo CMA Duncan Dull) (October 01, 2010 1:32 PM)  O2 Flow:  Room air CC: 6 month med follow up, pt is also having excessive gas, also states Miralax is not working as well as it used to....SP Is Patient Diabetic? No   Primary Care Provider:  Seymour Bars DO  CC:  6 month med follow up, pt is also having excessive gas, and also states Miralax is not working as well as it used to....SP.  History of Present Illness: 40 yo WF presents for f/u visit.  She reports that her fibromyalgia and IBS have worsened.  She tried a gluten free diet but it did not help her and she thinks this worsened her IBS with bloating and constipation with alternating diarrhea and gas.  She is back to eating a regular diet.  She is using Miralax 1-2 x a day but it does not seem to be helping.  She has been taking a stool softener on and off.  Not on a fiber supplement.    She is on 90 mg of Cymbalta daily and Trazadone but they do not seem to be helping.  She is not sleeping as well.  She is still seeing the chiropractor weekly which is helping some but she is needing to take Tylenol for pain.    No acute stressors.  She was doing pilates before her had sinusitis, diagnosed 3 wks ago by [facility].  She is on Amoxicillin and it ishelping.       Current Medications (verified): 1)  Trazodone Hcl 100 Mg Tabs (Trazodone Hcl) .... 2 Tabs By Mouth Qhs 2)  Allegra-D Allergy & Congestion 60-120 Mg Xr12h-Tab (Fexofenadine-Pseudoephedrine) .... Take One Tablet By Mouth Once A Day 3)  Lansoprazole 30 Mg Cpdr (Lansoprazole) .Marland Kitchen.. 1 Tab By Mouth Bid 4)  Cymbalta 30 Mg Cpep (Duloxetine Hcl) .... Take 3 Caps By Mouth Once Daily 5)   Vitamin D3 5000 Unit/ml Liqd (Cholecalciferol) .... Take 1 Tab By Mouth Once Daily 6)  Calcium 600 Mg Tabs (Calcium) .... Take 3 Tab By Mouth Once Daily 7)  Asmanex 60 Metered Doses 220 Mcg/inh Aepb (Mometasone Furoate) .... Take 2 Puffs Two Times A Day 8)  Xopenex 0.63 Mg/38ml Nebu (Levalbuterol Hcl) 9)  Fluticasone Propionate 50 Mcg/act Susp (Fluticasone Propionate) .Marland Kitchen.. 1 Spray Per Nostril Two Times A Day 10)  Vitamin B-12 100 Mcg Tabs (Cyanocobalamin) 11)  Miralax  Powd (Polyethylene Glycol 3350) .... Use As Directed Per Bottle 12)  Amoxicillin 875 Mg Tabs (Amoxicillin) .... Take 1 Tablet By Mouth Two Times A Day For 10 Days 13)  Stress and Adrenal Support .... Take 1 Tablet By Mouth Once A Day 14)  Malic B6 700-50 Mg Caps (Nutritional Supplements) .... Take 3 Tablets Daily 15)  Vitamin C 500 Mg Tabs (Ascorbic Acid) .... Take 1 Tablet By Mouth Once A Day  Allergies (verified): 1)  ! Zithromax 2)  ! Ambien (Zolpidem Tartrate)  Past History:  Past Medical History: fibromyalgia G3P2 allergies/ asthma IBS allergist in [place]  Past Surgical History: Reviewed  history from 01/13/2010 and no changes required. BTL endometrial ablation Dr Val Eagle' Nathaneil Canary tonsillectomy LTCS x 2  Social History: Reviewed history from 04/12/2010 and no changes required. Married.  2 daughters. [employer]- education- teaching.  Never smoked. Exercises some. Denies ETOH.  Review of Systems      See HPI  Physical Exam  General:  alert, well-developed, well-nourished, and well-hydrated.   Head:  normocephalic and atraumatic.   Mouth:  pharynx pink and moist.   Neck:  no masses.   Lungs:  Normal respiratory effort, chest expands symmetrically. Lungs are clear to auscultation, no crackles or wheezes. Heart:  Normal rate and regular rhythm. S1 and S2 normal without gallop, murmur, click, rub or other extra sounds. Abdomen:  soft, non-tender, normal bowel sounds, no distention, no masses, no guarding, no rigidity, no  hepatomegaly, and no splenomegaly.   Msk:  no joint tenderness, no joint swelling, no joint warmth, and no redness over joints.  tight trapezious muscles with diffuse tenderness over thoracic region.  gait normal  Extremities:  no UE or LE edema Neurologic:  strength normal in all extremities and gait normal.   Skin:  color normal.   Cervical Nodes:  No lymphadenopathy noted Psych:  good eye contact, not anxious appearing, and flat affect.     Impression & Recommendations:  Problem # 1:  FIBROMYALGIA (ICD-729.1) Assessment Deteriorated Recent minor illnesses thru the Winter seems to have flared her fibromyalgia.  Her sleep has worseneded even with Trazadone.  will trade out for Gabapentin.  Stay on Cymbalta, using Mobic once daily for pain and inflammation.  Work on regular exercise and stress reduction.  OK to continue chiropractic treatments.   Her updated medication list for this problem includes:    Meloxicam 7.5 Mg Tabs (Meloxicam) .Marland Kitchen... 1-2 tabs by mouth once daily as needed for fibromyalgia pain  Problem # 2:  IRRITABLE BOWEL SYNDROME (ICD-564.1) Assessment: Deteriorated Worsened thru Winter with added strssors of mild illness, work stressors and dietary changes. She is back on a regular healthy diet and agrees to work on stress reduction and regular exercise.  Add a fiber supplement once daily for both constipation and diarrhea and OK to stay on Miralax for the constipation.  Can add [facility] probiotics also.    Complete Medication List: 1)  Allegra-d Allergy & Congestion 60-120 Mg Xr12h-tab (Fexofenadine-pseudoephedrine) .... Take one tablet by mouth once a day 2)  Lansoprazole 30 Mg Cpdr (Lansoprazole) .Marland Kitchen.. 1 tab by mouth bid 3)  Cymbalta 30 Mg Cpep (Duloxetine hcl) .... Take 3 caps by mouth once daily 4)  Vitamin D3 5000 Unit/ml Liqd (Cholecalciferol) .... Take 1 tab by mouth once daily 5)  Calcium 600 Mg Tabs (Calcium) .... Take 3 tab by mouth once daily 6)   Asmanex 60 Metered Doses 220 Mcg/inh Aepb (Mometasone furoate) .... Take 2 puffs two times a day 7)  Xopenex 0.63 Mg/47ml Nebu (Levalbuterol hcl) 8)  Fluticasone Propionate 50 Mcg/act Susp (Fluticasone propionate) .Marland Kitchen.. 1 spray per nostril two times a day 9)  Vitamin B-12 100 Mcg Tabs (Cyanocobalamin) 10)  Miralax Powd (Polyethylene glycol 3350) .... Use as directed per bottle 11)  Stress and Adrenal Support  .... Take 1 tablet by mouth once a day 12)  Malic B6 700-50 Mg Caps (Nutritional supplements) .... Take 3 tablets daily 13)  Vitamin C 500 Mg Tabs (Ascorbic acid) .... Take 1 tablet by mouth once a day 14)  Meloxicam 7.5 Mg Tabs (Meloxicam) .Marland Kitchen.. 1-2 tabs by  mouth once daily as needed for fibromyalgia pain 15)  Gabapentin 300 Mg Caps (Gabapentin) .Marland Kitchen.. 1 capsule by mouth qhs  Patient Instructions: 1)  For Fibromyalgia: 2)  Add once daily Meloxicam for pain and inflammation as needed. 3)  Trade Trazadone for Gabapentin at night. 4)  Stay on Cymbalta.   5)  Continue regular exercise and chiropractic treatments. 6)  For IBS: 7)  Stay on Miralax once daily. 8)  Add Benefiber once daily. 9)  Stay on high fiber diet.  Call if not improved in 3 wks. 10)  Finish out Amoxicillin. 11)  REturn for f/u in 2 mos. Prescriptions: MIRALAX  POWD (POLYETHYLENE GLYCOL 3350) Use as directed per bottle  #1 bottle x 6   Entered and Authorized by:   Seymour Bars DO   Signed by:   Seymour Bars DO on 10/01/2010   Method used:   Electronically to        [facility]* (retail)       [address]       [place], [place]  [address]       Ph: [phone]       Fax: [phone]   RxID:   [id number] CYMBALTA 30 MG CPEP (DULOXETINE HCL) Take 3 caps by mouth once daily  #90 x 6   Entered and Authorized by:   Seymour Bars DO   Signed by:   Seymour Bars DO on 10/01/2010   Method used:   Electronically to        [facility]* (retail)       [address]        [place], [place]  [address]       Ph: [phone]       Fax: [phone]   RxID:   [id number] LANSOPRAZOLE 30 MG CPDR (LANSOPRAZOLE) 1 tab by mouth bid  #60 Capsule x 6   Entered and Authorized by:   Seymour Bars DO   Signed by:   Seymour Bars DO on 10/01/2010   Method used:   Electronically to        [facility]* (retail)       [address]       [place], [place]  [address]       Ph: [phone]       Fax: [phone]   RxID:   [id number] GABAPENTIN 300 MG CAPS (GABAPENTIN) 1 capsule by mouth qhs  #30 x 2   Entered and Authorized by:   Seymour Bars DO   Signed by:   Seymour Bars DO on 10/01/2010   Method used:   Electronically to        [facility]* (retail)       [address]       [place], [place]  [address]       Ph: [phone]       Fax: [phone]   RxID:   [id number] MELOXICAM 7.5 MG TABS (MELOXICAM) 1-2 tabs by mouth once daily as needed for fibromyalgia pain  #40 x 2   Entered and Authorized by:   Seymour Bars DO   Signed by:   Seymour Bars DO on 10/01/2010   Method used:   Electronically to        [facility]* (retail)       [address]       [place], [place]  [address]       Ph: [phone]  Fax: [phone]   RxID:   [id number]    Orders Added: 1)  Est. Patient Level IV [[address]]

## 2010-10-17 ENCOUNTER — Encounter: Payer: Self-pay | Admitting: Family Medicine

## 2010-10-18 ENCOUNTER — Ambulatory Visit: Payer: BC Managed Care – PPO | Admitting: Family Medicine

## 2010-10-20 ENCOUNTER — Other Ambulatory Visit: Payer: Self-pay | Admitting: Family Medicine

## 2010-10-20 ENCOUNTER — Encounter: Payer: Self-pay | Admitting: Family Medicine

## 2010-10-20 ENCOUNTER — Ambulatory Visit (INDEPENDENT_AMBULATORY_CARE_PROVIDER_SITE_OTHER): Payer: BC Managed Care – PPO | Admitting: Family Medicine

## 2010-10-20 DIAGNOSIS — K2970 Gastritis, unspecified, without bleeding: Secondary | ICD-10-CM

## 2010-10-20 DIAGNOSIS — R5381 Other malaise: Secondary | ICD-10-CM

## 2010-10-20 DIAGNOSIS — R5383 Other fatigue: Secondary | ICD-10-CM

## 2010-10-20 NOTE — Progress Notes (Signed)
  Subjective:    Patient ID: Tara Howard, female    DOB: Oct 04, 1970, 40 y.o.   MRN: [id number]  HPI 40 yo WF presents for ED f/u for gastritis.  She was seen there on 3-22 and her labs were normal other than a K+ of 3.2.  An abd u/s was normal.  She went to GI in [place] and was diagnosed with gastritis on EGD.  Meloxicam was removed from her list.  She was added Protonix and Carafate and her Miralax was increased to bid  And she is taking the fiber supplement.  She is starting to feel better.  She was still constipated on the Miralax once daily.  She still has nausea but her epigastric pain is improving.  Denies melena or hematochezia.  She would like to get off some of her meds esp for sleep.  Review of Systems  Constitutional: Positive for unexpected weight change (weight loss). Negative for fatigue.  Respiratory: Negative for chest tightness and shortness of breath.   Cardiovascular: Negative for chest pain.       Objective:   Physical Exam  Constitutional: She appears well-developed.  HENT:  Head: Normocephalic and atraumatic.  Eyes:       + conjunctival pallor  Neck: Normal range of motion. Neck supple. No thyromegaly present.  Cardiovascular: Normal rate and normal heart sounds.   Pulmonary/Chest: She is in respiratory distress. She has no wheezes.  Abdominal: Soft. There is tenderness (epigastric).  Skin: Skin is warm and dry. There is pallor.  Psychiatric: She has a normal mood and affect.          Assessment & Plan:

## 2010-10-20 NOTE — Patient Instructions (Signed)
Labs downstairs today. Will call you w/ results tomorrow.  F/U with GI for gastritis.  F/U for fibromyalgia in 2 months.

## 2010-10-20 NOTE — Assessment & Plan Note (Signed)
Will try to get results from her recent EGD which apparently showed gastritis.  She is now off Meloxicam, likely a cause and her path is pending for H. Pylori.  She is to stay OFF NSAIDs and void ETOH.  She is to stay on Pantoprazole and Carafate and has f/u with GI in June.

## 2010-10-20 NOTE — Assessment & Plan Note (Signed)
She is c/o fatigue but she was not anemic in the ED.  She appears to be iron def on exam.  Will screen for this, B12 def and Vit D def today and f/u results tomorrow.

## 2010-10-21 LAB — VITAMIN B12: Vitamin B-12: >2000 pg/mL — ABNORMAL HIGH (ref 211–911)

## 2010-10-21 LAB — IRON AND TIBC
Iron: 83 ug/dL (ref 42–145)
TIBC: 282 ug/dL (ref 250–470)
UIBC: 199 ug/dL

## 2010-10-21 LAB — FERRITIN: Ferritin: 75 ng/mL (ref 10–291)

## 2010-11-01 ENCOUNTER — Other Ambulatory Visit: Payer: Self-pay | Admitting: *Deleted

## 2010-11-01 MED ORDER — SUCRALFATE 1 G PO TABS: 1.0000 g | ORAL_TABLET | Freq: Four times a day (QID) | ORAL | Status: DC

## 2010-11-04 ENCOUNTER — Telehealth: Payer: Self-pay | Admitting: Family Medicine

## 2010-11-04 MED ORDER — HYDROCODONE-ACETAMINOPHEN 5-500 MG PO TABS: 1.0000 | ORAL_TABLET | Freq: Three times a day (TID) | ORAL | Status: DC | PRN

## 2010-11-04 NOTE — Telephone Encounter (Signed)
I received RX request for Vicodin.  I will fill this for her for severe pain only, use sparingly with limited quantities only.

## 2010-12-07 ENCOUNTER — Encounter: Payer: Self-pay | Admitting: Family Medicine

## 2010-12-07 ENCOUNTER — Ambulatory Visit (INDEPENDENT_AMBULATORY_CARE_PROVIDER_SITE_OTHER): Payer: BC Managed Care – PPO | Admitting: Family Medicine

## 2010-12-07 DIAGNOSIS — IMO0001 Reserved for inherently not codable concepts without codable children: Secondary | ICD-10-CM

## 2010-12-07 DIAGNOSIS — K589 Irritable bowel syndrome without diarrhea: Secondary | ICD-10-CM

## 2010-12-07 DIAGNOSIS — G43909 Migraine, unspecified, not intractable, without status migrainosus: Secondary | ICD-10-CM

## 2010-12-07 MED ORDER — ACETAMINOPHEN-CODEINE 300-30 MG PO TABS: 1.0000 | ORAL_TABLET | Freq: Four times a day (QID) | ORAL | Status: DC | PRN

## 2010-12-07 NOTE — Assessment & Plan Note (Signed)
Stable.  On Cymbalta 90 mg/ day, Trazadone at night and Tylenol prn aches and pains.  We discussed more regular exercise and to continue chiropractor/ massage on a regular basis.

## 2010-12-07 NOTE — Patient Instructions (Signed)
For asthma, use Xopenex 2 puffs 15 min before start of exercise. Use during and after if needed.  For migraines/ neck pain - continue chiropractic treatments and massage. Try to get 45 min of exercise 4-5 days/ wk for fibromyalgia and migraine prevention.  Use Tylenol or Tylenol #3 (for more severe headaches) up to 3 x a wk for migraines.  REturn for f/u in 4 mos.

## 2010-12-07 NOTE — Assessment & Plan Note (Signed)
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  Unable to take triptans given SNRI use.  Will add Tylenol #3 sparingly for severe pain PRN.  Continue regular exercise, good sleep hygeine, healthy diet, chiropractic therapy and massage therapy.

## 2010-12-07 NOTE — Progress Notes (Signed)
  Subjective:    Patient ID: Tara Howard, female    DOB: 07/29/1970, 40 y.o.   MRN: [id number]  HPI 40 yo WF presents for f/u visit.  She has IBS, Fibromyalgia and migraines.  She has been having more migraines.  She had a massage which helped her neck and shoulder pain.  She was taking a lot of tylenol and is off meloxicam due to gastritits.  She is weaning off trazadone but is still on 1/2 tab.  We tried her on gabapentin but she didn't notice a difference.   She is still taking Miralax bid and fiber daily which has really helped.  She is eating healthy and exercising.  She is still seeing the chiropractor and cannot take triptans due to use of high dose Cymbalta and trazodone.  She plans to start exercising more.  BP 117/78  Pulse 75  Ht 5\' 4"  (1.626 m)  Wt 120 lb (54.432 kg)  BMI 20.60 kg/m2  SpO2 100%  Patient Active Problem List  Diagnoses  . ALLERGIC ASTHMA  . ASTHMA  . GERD  . DYSPEPSIA  . FIBROMYALGIA  . IRRITABLE BOWEL SYNDROME  . Gastritis without bleeding  . Fatigue      Review of Systems  Constitutional: Negative for fever, appetite change, fatigue and unexpected weight change.  Respiratory: Negative for shortness of breath.   Cardiovascular: Negative for chest pain, palpitations and leg swelling.  Gastrointestinal: Negative for nausea, diarrhea and constipation.  Musculoskeletal: Positive for myalgias.  Neurological: Positive for headaches. Negative for weakness.  Psychiatric/Behavioral: Positive for sleep disturbance. Negative for dysphoric mood. The patient is not nervous/anxious.        Objective:   Physical Exam  Constitutional: She appears well-developed and well-nourished. No distress.  HENT:  Head: Normocephalic and atraumatic.  Eyes: Conjunctivae are normal.  Neck: Neck supple. No thyromegaly present.  Cardiovascular: Normal rate, regular rhythm and normal heart sounds.   No murmur heard. Pulmonary/Chest: Effort normal and breath sounds normal.  No respiratory distress.  Abdominal: Soft. She exhibits no distension. There is no tenderness. There is no guarding.  Musculoskeletal: She exhibits no edema.       Tender R trap muscles with full c spine ROM  Lymphadenopathy:    She has no cervical adenopathy.  Skin: Skin is warm and dry.  Psychiatric: She has a normal mood and affect.          Assessment & Plan:

## 2010-12-07 NOTE — Assessment & Plan Note (Signed)
IBS - c.  Much improved on miralax, fiber supplement and probiotic.  Continue.

## 2010-12-21 ENCOUNTER — Ambulatory Visit: Payer: BC Managed Care – PPO | Admitting: Family Medicine

## 2010-12-27 ENCOUNTER — Telehealth: Payer: Self-pay | Admitting: Family Medicine

## 2010-12-27 ENCOUNTER — Ambulatory Visit (INDEPENDENT_AMBULATORY_CARE_PROVIDER_SITE_OTHER): Payer: BC Managed Care – PPO | Admitting: Family Medicine

## 2010-12-27 ENCOUNTER — Encounter: Payer: Self-pay | Admitting: Family Medicine

## 2010-12-27 VITALS — BP 116/77 | HR 85 | Temp 98.7°F | Ht 64.0 in | Wt 120.0 lb

## 2010-12-27 DIAGNOSIS — J45909 Unspecified asthma, uncomplicated: Secondary | ICD-10-CM

## 2010-12-27 DIAGNOSIS — J45901 Unspecified asthma with (acute) exacerbation: Secondary | ICD-10-CM

## 2010-12-27 MED ORDER — METHYLPREDNISOLONE ACETATE 40 MG/ML IJ SUSP
40.0000 mg | Freq: Once | INTRAMUSCULAR | Status: AC
  Administered 2010-12-27: 40 mg via INTRAMUSCULAR

## 2010-12-27 NOTE — Progress Notes (Signed)
  Subjective:    Patient ID: Tara Howard, female    DOB: 1971/02/03, 40 y.o.   MRN: [id number]  HPI 40 yo WF presents for a dry chest cough x 2 wks.  Has a hx of allergic asthma- on Asmanex, Xopenex HFA and allegra.  Denies sore throat, rhinorrhea.  No fevers or chills.  Her cough is worse at night when she lays down.  She is taking Mucinex OTC which helped a little bit.  She has chest tightness and some SOB.    BP 116/77  Pulse 85  Temp(Src) 98.7 F (37.1 C) (Oral)  Ht 5\' 4"  (1.626 m)  Wt 120 lb (54.432 kg)  BMI 20.60 kg/m2  SpO2 100%  PF 580 L/min     Review of Systems  Constitutional: Negative for fever, chills, appetite change and fatigue.  HENT: Positive for sneezing and postnasal drip. Negative for ear pain, congestion and rhinorrhea.   Eyes: Negative for itching.  Respiratory: Positive for cough, chest tightness, shortness of breath and wheezing.   Cardiovascular: Negative for chest pain, palpitations and leg swelling.  Gastrointestinal: Negative for nausea, abdominal pain and diarrhea.  Skin: Negative for rash.  Neurological: Negative for headaches.       Objective:   Physical Exam  Constitutional: She appears well-developed and well-nourished. No distress.  HENT:  Head: Normocephalic and atraumatic.  Right Ear: External ear normal.  Left Ear: External ear normal.  Nose: Nose normal.  Mouth/Throat: Oropharynx is clear and moist.       Clear postnasal drip with cobblestoning  Eyes: Conjunctivae are normal.  Neck: Neck supple.  Cardiovascular: Normal rate, regular rhythm and normal heart sounds.   Pulmonary/Chest: Effort normal and breath sounds normal. No respiratory distress. She has no wheezes.       Dry hacking cough  Lymphadenopathy:    She has no cervical adenopathy.  Skin: Skin is warm and dry.  Psychiatric: She has a normal mood and affect.          Assessment & Plan:

## 2010-12-27 NOTE — Telephone Encounter (Signed)
Pt called and going out of town tomorrow and her asthma has flared and her allergies are bothering her.  Pt C/O  Productive cough with thick, green mucus,  Constantly clearing throat, chest hurts with itchy feeling.  Using inhaler the max in 24 hour period 5-6 times. Plan:  Appt scheduled today with Dr. Cathey Endow for 3:30pm.  Pt told to arrive at 3:20pm to check in front desk.  Told to do symptomatic treatment and take it easy and increase the fluids.  Cont the use of asthma/allergy medications as prescribed. Jarvis Newcomer, LPN [facility]

## 2010-12-27 NOTE — Patient Instructions (Signed)
Depo Medrol injection given today.  Hold Asmanex and change to [facility] sample 2 puffs twice a day for the next 2 wks. Rinse mouth out after each use.  Use OTC Delsym for cough and stay on Allegra D for allergies/ postnasal drip.  Use Xopenex as needed.  Call if any problems.  Return for f/u in 1 month.

## 2010-12-27 NOTE — Assessment & Plan Note (Signed)
Flare of allergic asthma.  resssured by excellent pulse ox and lack of rhonchi or wheezing on lung exam.  No fevers or constitutional symptoms to suggest a bacterial processs.  Will treat with changing her asmanex to dulera 100 sample given to use 2 puffs bid x 2-3 wks and use Xopenex prn.  Added Depo Medrol 40 mg IM injection today for asthma flare.  PFs in green zone today.  If not improving or symptoms worsen, please call.

## 2010-12-29 ENCOUNTER — Telehealth: Payer: Self-pay | Admitting: Family Medicine

## 2010-12-29 NOTE — Telephone Encounter (Signed)
Pt called and is out of town and was seen this past Monday for flare of her asthma.  She was given an MDi adn steroid meds and the last two days feels worse.  Cough is worse.  Pt is requesting an Ab Tx to be called for her since [place]. Plan:  Routed to Dr. Arlice Colt, LPN [facility]

## 2010-12-29 NOTE — Telephone Encounter (Signed)
OK to call her in Amoxicillin 500 mg 1 capsule po tid x 10 days #30 [place].

## 2010-12-30 ENCOUNTER — Telehealth: Payer: Self-pay | Admitting: Family Medicine

## 2010-12-30 NOTE — Telephone Encounter (Signed)
Pt  Called and is waiting on a call and she said someone returned her call today and she missed it.  Pt is worse today than before. Jarvis Newcomer, LPN [facility]

## 2010-12-30 NOTE — Telephone Encounter (Signed)
LMOM requesting Pt to CB w/ pharm info

## 2010-12-30 NOTE — Telephone Encounter (Signed)
Called Rx in 

## 2010-12-31 NOTE — Telephone Encounter (Signed)
I think Tara Howard took care of this yesterday.  I will send the message back to both of you,.

## 2010-12-31 NOTE — Telephone Encounter (Signed)
Rx called in 

## 2011-02-21 ENCOUNTER — Other Ambulatory Visit: Payer: Self-pay | Admitting: Family Medicine

## 2011-02-21 MED ORDER — MOMETASONE FURO-FORMOTEROL FUM 100-5 MCG/ACT IN AERO: 2.0000 | INHALATION_SPRAY | Freq: Two times a day (BID) | RESPIRATORY_TRACT | Status: DC

## 2011-02-21 NOTE — Telephone Encounter (Signed)
Pt called and said she had been given a sample med for dulera MDI.  Medication worked well and she was told to call back to get a RX sent if done well on med.  Would like a Rx. Plan:  Reviewed pt chart and the dulera 100 instructions read to use BID X 2-3 weeks.  Do you want this medication refilled??? Routed to Dr. Arlice Colt, LPN [facility]

## 2011-02-21 NOTE — Telephone Encounter (Signed)
RX sent

## 2011-02-21 NOTE — Telephone Encounter (Signed)
Pt informed that the script for dulera was sent to her pharm. Jarvis Newcomer, LPN [facility]

## 2011-03-04 ENCOUNTER — Other Ambulatory Visit: Payer: Self-pay | Admitting: Family Medicine

## 2011-03-04 ENCOUNTER — Telehealth: Payer: Self-pay | Admitting: Family Medicine

## 2011-03-04 ENCOUNTER — Encounter: Payer: Self-pay | Admitting: Family Medicine

## 2011-03-04 ENCOUNTER — Ambulatory Visit
Admission: RE | Admit: 2011-03-04 | Discharge: 2011-03-04 | Disposition: A | Discharge status: Home / Self Care | Payer: BC Managed Care – PPO | Source: Ambulatory Visit | Attending: Family Medicine | Admitting: Family Medicine

## 2011-03-04 ENCOUNTER — Ambulatory Visit (INDEPENDENT_AMBULATORY_CARE_PROVIDER_SITE_OTHER): Payer: BC Managed Care – PPO | Admitting: Family Medicine

## 2011-03-04 VITALS — BP 115/74 | HR 99 | Ht 64.0 in | Wt 123.0 lb

## 2011-03-04 DIAGNOSIS — M25569 Pain in unspecified knee: Secondary | ICD-10-CM

## 2011-03-04 DIAGNOSIS — M25562 Pain in left knee: Secondary | ICD-10-CM

## 2011-03-04 MED ORDER — TRAZODONE HCL 100 MG PO TABS: ORAL_TABLET | ORAL | Status: DC

## 2011-03-04 MED ORDER — MONTELUKAST SODIUM 10 MG PO TABS: 10.0000 mg | ORAL_TABLET | Freq: Every day | ORAL | Status: DC

## 2011-03-04 MED ORDER — ACETAMINOPHEN-CODEINE 300-30 MG PO TABS: 1.0000 | ORAL_TABLET | Freq: Four times a day (QID) | ORAL | Status: DC | PRN

## 2011-03-04 MED ORDER — PANTOPRAZOLE SODIUM 40 MG PO TBEC: 40.0000 mg | DELAYED_RELEASE_TABLET | Freq: Every day | ORAL | Status: DC

## 2011-03-04 NOTE — Patient Instructions (Signed)
Xray L knee today. Will call you w/ results Monday.  meds RFd.  Ortho referral made.

## 2011-03-04 NOTE — Assessment & Plan Note (Signed)
L knee pain w/o trauma.  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  Xray is normal today.  She has used a brace, ice and pain meds and cannot take NSAIDs due to recent gastritis.  Will get her in with ortho for further eval.

## 2011-03-04 NOTE — Telephone Encounter (Signed)
Pls let pt know that her knee xray came back normal (as expected).  Fax copy to ortho for upcoming appt.

## 2011-03-04 NOTE — Progress Notes (Signed)
  Subjective:    Patient ID: Tara Howard, female    DOB: [birth year], 40 y.o.   MRN: [id number]  HPI  39 yo WF presents for knee pain that started from an injury in college.  She saw an orthopedist years ago and did PT and wore a knee brace.  She now hsa L knee pain but did not have any trauma.  Has pain with squatting.  Hurts with standing.  Icing it and wearing brace.  She is taking tylenol #3 for pain.  No giving way.  No swelling.  BP 115/74  Pulse 99  Ht 5\' 4"  (1.626 m)  Wt 123 lb (55.792 kg)  BMI 21.11 kg/m2  SpO2 100%   Review of Systems  Musculoskeletal: Negative for joint swelling and gait problem.       Objective:   Physical Exam  Constitutional: She appears well-developed and well-nourished.  Musculoskeletal:       Left knee: She exhibits normal range of motion, no swelling, no effusion, no ecchymosis, no deformity, no erythema, normal alignment, no LCL laxity and normal patellar mobility. no tenderness found. No medial joint line, no lateral joint line and no patellar tendon tenderness noted.          Assessment & Plan:

## 2011-03-07 NOTE — Telephone Encounter (Signed)
Pt aware.

## 2011-03-08 ENCOUNTER — Telehealth: Payer: Self-pay | Admitting: Family Medicine

## 2011-03-08 NOTE — Telephone Encounter (Signed)
Pt called and wants to inquire about her ortho surgeon referral.  She states she has not heard anything about her referral appt.  Wants our office to sched by the end of today and let her know when appt is for. Plan:  Looked at the referral notes.  Referral coordinator not available to talk to today.  Called the specialty clinics downstairs and spoke with [facility]  They had not set up the appt yet.  Inocencio Homes will call the pt today at the pt request and get that scheduled before the end of today.  Simonne Martinet the current number to reach the pt.  Also, called myself and LMOM for the pt telling her someone would call her by the end of the day. Jarvis Newcomer, LPN [facility]

## 2011-03-24 ENCOUNTER — Encounter: Payer: Self-pay | Admitting: Family Medicine

## 2011-04-05 ENCOUNTER — Ambulatory Visit: Payer: BC Managed Care – PPO | Admitting: Family Medicine

## 2011-06-03 ENCOUNTER — Other Ambulatory Visit: Payer: Self-pay | Admitting: Family Medicine

## 2011-06-03 ENCOUNTER — Other Ambulatory Visit: Payer: Self-pay | Admitting: *Deleted

## 2011-06-03 MED ORDER — POLYETHYLENE GLYCOL 3350 17 GM/SCOOP PO POWD: 17.0000 g | ORAL | Status: DC

## 2011-06-03 MED ORDER — ACETAMINOPHEN-CODEINE 300-30 MG PO TABS: 1.0000 | ORAL_TABLET | Freq: Four times a day (QID) | ORAL | Status: DC | PRN

## 2011-06-06 ENCOUNTER — Other Ambulatory Visit: Payer: Self-pay | Admitting: Family Medicine

## 2011-06-08 ENCOUNTER — Other Ambulatory Visit: Payer: Self-pay | Admitting: Family Medicine

## 2011-06-24 ENCOUNTER — Encounter: Payer: Self-pay | Admitting: Family Medicine

## 2011-06-24 ENCOUNTER — Ambulatory Visit (INDEPENDENT_AMBULATORY_CARE_PROVIDER_SITE_OTHER): Payer: BC Managed Care – PPO | Admitting: Family Medicine

## 2011-06-24 VITALS — BP 91/54 | HR 56 | Wt 127.0 lb

## 2011-06-24 DIAGNOSIS — R202 Paresthesia of skin: Secondary | ICD-10-CM

## 2011-06-24 DIAGNOSIS — G43909 Migraine, unspecified, not intractable, without status migrainosus: Secondary | ICD-10-CM

## 2011-06-24 DIAGNOSIS — F419 Anxiety disorder, unspecified: Secondary | ICD-10-CM

## 2011-06-24 DIAGNOSIS — F411 Generalized anxiety disorder: Secondary | ICD-10-CM

## 2011-06-24 DIAGNOSIS — R209 Unspecified disturbances of skin sensation: Secondary | ICD-10-CM

## 2011-06-24 MED ORDER — SUMATRIPTAN SUCCINATE 100 MG PO TABS: 100.0000 mg | ORAL_TABLET | ORAL | Status: DC | PRN

## 2011-06-24 MED ORDER — PAROXETINE HCL 20 MG PO TABS: 20.0000 mg | ORAL_TABLET | ORAL | Status: DC

## 2011-06-24 NOTE — Progress Notes (Signed)
Subjective:    Patient ID: Tara Howard, female    DOB: 1970/12/03, 39 y.o.   MRN: [id number]  HPI Long hx of depression or anxiety- Says has been on the cymblata for years. Takes 90 mg daily. Feels really stressed and tense.  Using trazodone every night. No hx of bipolar. Sleep is ok if takes her trazodone.  She feels the med doesn't her her pain as well. Thinks has taken paxil in the past and didn't well. Has had more frequent migraines.    Thinks has RLS and hands and feet get numb and tingling. Don't feel cold at the same  Time.  Right arm is the worse. Does have a history of carpel tunnel. She is right handed. Hx of neck and low back pain. Usually neck and shoulder are worse. She is going to massage therapy regularly. WAs working out at one time and helped some.  Says she hasn't taken muscle relaxer because of her reflux.   Review of Systems BP 91/54  Pulse 56  Wt 127 lb (57.607 kg)    Allergies  Allergen Reactions  . Azithromycin   . Zolpidem Tartrate     Past Medical History  Diagnosis Date  . Fibromyalgia   . Asthma   . Allergy     allergist in [place]  . IBS (irritable bowel syndrome)     Past Surgical History  Procedure Date  . Btl   . Endometrial ablation   . Tonsillectomy   . Ltcs     X 2    History   Social History  . Marital Status: Married    Spouse Name: N/A    Number of Children: N/A  . Years of Education: N/A   Occupational History  . Not on file.   Social History Main Topics  . Smoking status: Never Smoker   . Smokeless tobacco: Not on file  . Alcohol Use: No  . Drug Use:   . Sexually Active:    Other Topics Concern  . Not on file   Social History Narrative  . No narrative on file    Family History  Problem Relation Age of Onset  . Depression Mother   . Hypertension Mother       Objective:   Physical Exam  Constitutional: She appears well-developed and well-nourished.  HENT:  Head: Normocephalic and atraumatic.    Musculoskeletal:       Normal flexion of the neck.  Dec extension. Normal rotation right and left. Normal side bending. He does have a knot over the mid cervical spine. She also has a lot of tension in her right trapezius. Shoulders and lower extremities with normal range of motion strength 5 out of 5. Patellar reflexes and adduction reflexes 2+ bilaterally. Negative straight leg raise. Nontender over the lumbar spine.          Assessment & Plan:  Anxiety/depression - discussed different options. will wean the cymbalta and start paxil.  Fu in 5 weeks. PHQ 9 score 16.  Migraines -she wanted to know what she can take besides Tylenol #3. I explained we really try to avoid using narcotics for migraines and rely on other medications. She wanted something stronger than Tylenol #3 that she could use for her migraines. She has never really he tryptans. We will start with Imitrex, since this comes generic. I explained to watch out for potential side effects and we will see how effective it is for her migraines. There is a limited quantity that  can be used in a month time period and no more than 2 tablets in one day. Followup in 5 weeks. Call if any concerns.  Paresthesias in the hands and feet. Unclear etiology. Certainly this could be related to her anxiety but could be related from her neck and back pain. She is young to have spinal stenosis but certainly that a consideration. Also consider that she could have a deficiency such as B12. She does report she takes over-the-counter B12 daily. She does have chronic neck and back pain. Her paresthesias in the right seem to be worse. She is right-handed and has a history of carpal tunnel syndrome. We discussed starting to wear her wrist brace at night over the next couple weeks and see if this helps resolve her symptoms. We will also check a B12, folate and CBC and thyroid level. We also discussed that she does carry a lot of tension in her neck and that physical  therapy may be very helpful for her. The massage therapy does help her we could certainly add to this by starting her in PT.

## 2011-06-24 NOTE — Patient Instructions (Addendum)
Decrease your cymbalta to 60mg  daily for one week, then decrease to 30 mg daily for one week. Then start your paxil once a day. Then we will adjust your dose when you return.

## 2011-06-25 LAB — CBC
HCT: 39.7 % (ref 36.0–46.0)
MCHC: 32.2 g/dL (ref 30.0–36.0)
Platelets: 237 10*3/uL (ref 150–400)
RDW: 13.5 % (ref 11.5–15.5)
WBC: 5.3 10*3/uL (ref 4.0–10.5)

## 2011-06-25 LAB — TSH: TSH: 0.723 u[IU]/mL (ref 0.350–4.500)

## 2011-06-25 LAB — VITAMIN B12: Vitamin B-12: 1590 pg/mL — ABNORMAL HIGH (ref 211–911)

## 2011-06-25 LAB — FOLATE: Folate: 19 ng/mL

## 2011-06-30 ENCOUNTER — Other Ambulatory Visit: Payer: Self-pay | Admitting: Family Medicine

## 2011-06-30 MED ORDER — RIZATRIPTAN BENZOATE 10 MG PO TBDP: 10.0000 mg | ORAL_TABLET | ORAL | Status: DC | PRN

## 2011-07-05 ENCOUNTER — Ambulatory Visit: Payer: BC Managed Care – PPO | Admitting: Family Medicine

## 2011-07-05 DIAGNOSIS — Z0289 Encounter for other administrative examinations: Secondary | ICD-10-CM

## 2011-07-29 ENCOUNTER — Ambulatory Visit (INDEPENDENT_AMBULATORY_CARE_PROVIDER_SITE_OTHER): Payer: BC Managed Care – PPO | Admitting: Family Medicine

## 2011-07-29 ENCOUNTER — Encounter: Payer: Self-pay | Admitting: Family Medicine

## 2011-07-29 VITALS — BP 102/59 | HR 88 | Wt 131.0 lb

## 2011-07-29 DIAGNOSIS — Z23 Encounter for immunization: Secondary | ICD-10-CM

## 2011-07-29 DIAGNOSIS — M542 Cervicalgia: Secondary | ICD-10-CM

## 2011-07-29 DIAGNOSIS — F411 Generalized anxiety disorder: Secondary | ICD-10-CM

## 2011-07-29 DIAGNOSIS — F419 Anxiety disorder, unspecified: Secondary | ICD-10-CM

## 2011-07-29 DIAGNOSIS — F329 Major depressive disorder, single episode, unspecified: Secondary | ICD-10-CM

## 2011-07-29 DIAGNOSIS — G43909 Migraine, unspecified, not intractable, without status migrainosus: Secondary | ICD-10-CM

## 2011-07-29 MED ORDER — ELETRIPTAN HYDROBROMIDE 40 MG PO TABS: 40.0000 mg | ORAL_TABLET | ORAL | Status: DC | PRN

## 2011-07-29 MED ORDER — PAROXETINE HCL 40 MG PO TABS: 40.0000 mg | ORAL_TABLET | ORAL | Status: DC

## 2011-07-29 NOTE — Progress Notes (Signed)
  Subjective:    Patient ID: Tara Howard, female    DOB: Mar 30, 1971, 41 y.o.   MRN: [id number]  HPI Here to f/u mood. Doing well. Some impreovment. No side effects. Weight up a couple of pounds.  Still feels a little irritable. Feels it has helped her be more calm.   She has decided she would like to try PT for her neck.    Migraines - Tired the maxalt and it wasn't helpful. Says the Tylenol #3 is what works for [employer]. She also says imitrex didn't work.  Ok with trying another tryptan.  Also hopes PT will help her.    Review of Systems     Objective:   Physical Exam  Constitutional: She is oriented to person, place, and time. She appears well-developed and well-nourished.  Neurological: She is alert and oriented to person, place, and time.  Psychiatric: She has a normal mood and affect. Her behavior is normal.          Assessment & Plan:  Depressoin/Anxiety - GAD- 7 score of 6, PHQ-9 score of 13,  Improved. Inc paxil to 40mg . F.U in 6 weeks.   Neck Pain - Wants referral to PT to see if also helps her migraines  Migraines- Will try relpax. Failed maxalat and imitrex. Also gave sampls of orbivan and Bupap. If not improving with PT or responding to meds besides narcotics then refer HA specialist.   Given Tdap today.

## 2011-08-04 ENCOUNTER — Ambulatory Visit: Payer: BC Managed Care – PPO | Admitting: Physical Therapy

## 2011-08-26 ENCOUNTER — Encounter: Payer: Self-pay | Admitting: Family Medicine

## 2011-08-26 ENCOUNTER — Ambulatory Visit (INDEPENDENT_AMBULATORY_CARE_PROVIDER_SITE_OTHER): Payer: BC Managed Care – PPO | Admitting: Family Medicine

## 2011-08-26 VITALS — BP 113/70 | HR 94 | Temp 97.7°F | Wt 130.0 lb

## 2011-08-26 DIAGNOSIS — N6489 Other specified disorders of breast: Secondary | ICD-10-CM

## 2011-08-26 DIAGNOSIS — Z0000 Encounter for general adult medical examination without abnormal findings: Secondary | ICD-10-CM

## 2011-08-26 DIAGNOSIS — Z021 Encounter for pre-employment examination: Secondary | ICD-10-CM

## 2011-08-26 DIAGNOSIS — Z111 Encounter for screening for respiratory tuberculosis: Secondary | ICD-10-CM

## 2011-08-26 DIAGNOSIS — F329 Major depressive disorder, single episode, unspecified: Secondary | ICD-10-CM

## 2011-08-26 DIAGNOSIS — N644 Mastodynia: Secondary | ICD-10-CM

## 2011-08-26 NOTE — Patient Instructions (Signed)
Start a regular exercise program and make sure you are eating a healthy diet Try to eat 4 servings of dairy a day or take a calcium supplement (500mg twice a day). Your vaccines are up to date.   

## 2011-08-26 NOTE — Progress Notes (Signed)
Subjective:    Patient ID: Tara Howard, female    DOB: [birth year], 41 y.o.   MRN: [id number]  HPI Has been feeling sick for a couple of weeks.  Says still really nauseated and lightheaded.  Has been using miralax TID and Benefiber at night.  Has been working with her GI on a bowel obstruciton ( constipation).  Some cramping.  No BM today.  She needs a TB skin test for a new job she starts next week.    Review of Systems     Objective:   Physical Exam        Assessment & Plan:   Subjective:     Tara Howard is a 41 y.o. female and is here for a comprehensive physical exam. The patient reports no problems. She has had bilat breast tenderness for over a month, maybe 2 months. She avoid caffeine and soda.    She also stopped her antidepressant. She wants to see how dose off of it. Didn't feel well on the higher dose so weaned herself off. Has been off for 5 days. She is concerned because she says her sister felt that she, the patient, is normally a very high strung person and that the medication makes her much more relaxed. Thus she thinks it is making her different than her she really is. She did do well on the lower dosage as was a completely therapeutic.  History   Social History  . Marital Status: Married    Spouse Name: N/A    Number of Children: N/A  . Years of Education: N/A   Occupational History  . Not on file.   Social History Main Topics  . Smoking status: Never Smoker   . Smokeless tobacco: Not on file  . Alcohol Use: No  . Drug Use:   . Sexually Active:    Other Topics Concern  . Not on file   Social History Narrative  . No narrative on file   Health Maintenance  Topic Date Due  . Pap Smear  07/26/2011  . Influenza Vaccine  04/24/2012  . Tetanus/tdap  07/28/2021    The following portions of the patient's history were reviewed and updated as appropriate: allergies, current medications, past family history, past medical history, past social history,  past surgical history and problem list.  Review of Systems A comprehensive review of systems was negative.   Objective:    BP 113/70  Pulse 94  Temp(Src) 97.7 F (36.5 C) (Oral)  Wt 130 lb (58.968 kg)  SpO2 98% General appearance: alert, cooperative and appears stated age Head: Normocephalic, without obvious abnormality, atraumatic Eyes: conj clear, EOMi, PEERLA Ears: normal TM's and external ear canals both ears Nose: Nares normal. Septum midline. Mucosa normal. No drainage or sinus tenderness. Throat: lips, mucosa, and tongue normal; teeth and gums normal Neck: no adenopathy, no carotid bruit, no JVD, supple, symmetrical, trachea midline and thyroid not enlarged, symmetric, no tenderness/mass/nodules Back: symmetric, no curvature. ROM normal. No CVA tenderness. Lungs: clear to auscultation bilaterally Breasts: normal appearance, no masses or tenderness. This is some ridge of tissues to the outer edge of the areola tha is symmetric bilaterally but is also very tender.  Heart: regular rate and rhythm, S1, S2 normal, no murmur, click, rub or gallop Abdomen: soft, non-tender; bowel sounds normal; no masses,  no organomegaly Extremities: extremities normal, atraumatic, no cyanosis or edema Pulses: 2+ and symmetric Skin: Skin color, texture, turgor normal. No rashes or lesions Lymph nodes: Cervical, supraclavicular, and  axillary nodes normal. Neurologic: Grossly normal    Assessment:    Healthy female exam.      Plan:     See After Visit Summary for Counseling Recommendations  Start a regular exercise program and make sure you are eating a healthy diet Try to eat 4 servings of dairy a day or take a calcium supplement (500mg  twice a day). Your vaccines are up to date.   Breast tenderness - likely benign cystic breast tissue but symptoms have persisted for 2 months I'll order a diagnostic mammogram. Continue to avoid caffeine products.  Depression - Off meds. She will monitor.  Plans on starting counseling. hink counseling would be fantastic. I encouraged her to do this. Asked to please let me know she would like to go back on medication or try something different at some point in time. That is completely up to her. But I still think that she would benefit from medication.

## 2011-08-29 LAB — TB SKIN TEST: Induration: 0

## 2011-09-01 ENCOUNTER — Telehealth: Payer: Self-pay | Admitting: *Deleted

## 2011-09-01 NOTE — Telephone Encounter (Signed)
Pt.notified

## 2011-09-01 NOTE — Telephone Encounter (Signed)
Pt had a bowel obstruction before christmas. Feeling better, and working on getting more regular with BM. Pt sttaes that her vision if off, some dizziness, nausea. ? If lacking electrolytes and has been drinking gatorade. Any suggestions. Pt has appt in the morning at 1:00pm

## 2011-09-01 NOTE — Telephone Encounter (Signed)
Needs to be re-evaluated with exam and vitals check. I don't have any  further advice over the phone.

## 2011-09-02 ENCOUNTER — Encounter: Payer: Self-pay | Admitting: Family Medicine

## 2011-09-02 ENCOUNTER — Ambulatory Visit (INDEPENDENT_AMBULATORY_CARE_PROVIDER_SITE_OTHER): Payer: BC Managed Care – PPO | Admitting: Family Medicine

## 2011-09-02 VITALS — BP 114/72 | HR 89 | Wt 126.0 lb

## 2011-09-02 DIAGNOSIS — F329 Major depressive disorder, single episode, unspecified: Secondary | ICD-10-CM

## 2011-09-02 DIAGNOSIS — R42 Dizziness and giddiness: Secondary | ICD-10-CM

## 2011-09-02 DIAGNOSIS — R110 Nausea: Secondary | ICD-10-CM

## 2011-09-02 DIAGNOSIS — R1012 Left upper quadrant pain: Secondary | ICD-10-CM

## 2011-09-02 MED ORDER — FLUOXETINE HCL 10 MG PO CAPS: ORAL_CAPSULE | ORAL | Status: DC

## 2011-09-02 NOTE — Patient Instructions (Signed)
We will call you with your lab results. If you don't here from us in about a week then please give us a call at [phone].  

## 2011-09-02 NOTE — Progress Notes (Signed)
  Subjective:    Patient ID: Tara Howard, female    DOB: 1971/02/02, 41 y.o.   MRN: [id number]  HPI  Depression - has been more quiet lately. Feels more depressed.  Says has been off the paxil for 2 weeks. Would like to restart something but maybe wants to try fluoxetine instead. Has been on in the past.   Feeling nauseated and dizzy. Had a bowel obstruction last month but says she has not followed up with Gi, She is still having nauea and dizziness is worse.  Occ reflux.  Using ranitidine prn.  Has been trying to drink gatorade.  Had had tubal ligation.  No cramping or vaginla bleeding. She says her bowels are getting back to normal.  No bloodin teh stool. No fever or HA.     Review of Systems     Objective:   Physical Exam  Constitutional: She is oriented to person, place, and time. She appears well-developed and well-nourished.  HENT:  Head: Normocephalic and atraumatic.  Right Ear: External ear normal.  Left Ear: External ear normal.  Nose: Nose normal.  Mouth/Throat: Oropharynx is clear and moist.       TMs and canals are clear.   Eyes: Conjunctivae and EOM are normal. Pupils are equal, round, and reactive to light.  Neck: Neck supple. No thyromegaly present.  Cardiovascular: Normal rate, regular rhythm and normal heart sounds.   Pulmonary/Chest: Effort normal and breath sounds normal. She has no wheezes.  Abdominal: Soft. Bowel sounds are normal. She exhibits no distension and no mass. There is tenderness. There is no rebound and no guarding.       LLQ and LUQ tenderness.   Lymphadenopathy:    She has no cervical adenopathy.  Neurological: She is alert and oriented to person, place, and time.  Skin: Skin is warm and dry.  Psychiatric: She has a normal mood and affect.          Assessment & Plan:  Depression - PHQ-9 score of 14. Will start fluoxetine. F/U in 3 weeks.    Nausea/Dizzness - BP looks great. Check CMp to rule out electrolyte abnormality and cCBC to rule  out anemia and infection. Hasn't followed up yet with gi for her bowel obstructoin. Tender in the LUQ and LLQ. Will rule out pancreatitis. She needs to follow up with GI.  Continue bland diet.

## 2011-09-03 LAB — CBC WITH DIFFERENTIAL/PLATELET
Basophils Absolute: 0 10*3/uL (ref 0.0–0.1)
Basophils Relative: 1 % (ref 0–1)
Eosinophils Absolute: 0.1 10*3/uL (ref 0.0–0.7)
Lymphs Abs: 1.7 10*3/uL (ref 0.7–4.0)
MCH: 32.9 pg (ref 26.0–34.0)
Neutrophils Relative %: 57 % (ref 43–77)
Platelets: 216 10*3/uL (ref 150–400)
RBC: 4.25 MIL/uL (ref 3.87–5.11)

## 2011-09-03 LAB — COMPLETE METABOLIC PANEL WITH GFR
ALT: 15 U/L (ref 0–35)
BUN: 12 mg/dL (ref 6–23)
CO2: 26 mEq/L (ref 19–32)
Calcium: 9.5 mg/dL (ref 8.4–10.5)
Chloride: 102 mEq/L (ref 96–112)
Creat: 0.77 mg/dL (ref 0.50–1.10)
GFR, Est African American: >89 mL/min

## 2011-09-03 LAB — AMYLASE: Amylase: 64 U/L (ref 0–105)

## 2011-09-03 LAB — LIPASE: Lipase: 34 U/L (ref 0–75)

## 2011-09-05 ENCOUNTER — Ambulatory Visit
Admission: RE | Admit: 2011-09-05 | Discharge: 2011-09-05 | Disposition: A | Discharge status: Home / Self Care | Payer: BC Managed Care – PPO | Source: Ambulatory Visit | Attending: Family Medicine | Admitting: Family Medicine

## 2011-09-05 ENCOUNTER — Telehealth: Payer: Self-pay | Admitting: *Deleted

## 2011-09-05 NOTE — Telephone Encounter (Signed)
Pt is requesting to have xrays done just to make sure there no other issues going on. States that she has had bowel obstructions in the past. States she has noticed some symptoms of bowel obstruction in December. Symptoms are nausea and dizziness. Please advise.

## 2011-09-05 NOTE — Telephone Encounter (Signed)
Pt informed

## 2011-09-05 NOTE — Telephone Encounter (Signed)
Will order KUB. She can go today. But if normal then needs to see GI.

## 2011-09-07 ENCOUNTER — Encounter: Payer: Self-pay | Admitting: *Deleted

## 2011-09-12 ENCOUNTER — Ambulatory Visit: Payer: BC Managed Care – PPO | Admitting: Family Medicine

## 2011-09-19 ENCOUNTER — Telehealth: Payer: Self-pay | Admitting: Family Medicine

## 2011-09-19 NOTE — Telephone Encounter (Signed)
No malignancy seen on mammogram. No other explanation for her breast tenderness that. Next avoiding caffeine and wearing a very supportive bra. Patient is status change she might want to consider being refitted.

## 2011-09-20 NOTE — Telephone Encounter (Signed)
Spoke with spouse; pt wasn't there,will call again

## 2011-09-20 NOTE — Telephone Encounter (Signed)
Pt notified of results and MD instructions. KJ LPN 

## 2011-09-27 ENCOUNTER — Other Ambulatory Visit: Payer: Self-pay | Admitting: *Deleted

## 2011-10-03 ENCOUNTER — Telehealth: Payer: Self-pay | Admitting: *Deleted

## 2011-10-03 ENCOUNTER — Other Ambulatory Visit: Payer: Self-pay | Admitting: Family Medicine

## 2011-10-03 MED ORDER — TRAZODONE HCL 100 MG PO TABS: ORAL_TABLET | ORAL | Status: DC

## 2011-10-03 MED ORDER — FLUOXETINE HCL 20 MG PO CAPS: 20.0000 mg | ORAL_CAPSULE | Freq: Every day | ORAL | Status: DC

## 2011-10-03 NOTE — Telephone Encounter (Signed)
Pt states that she needs letter for Health insurance coverage. States she was denied for insurance due to her fibromyalgia and is appealing it. States she need a letter from you stating that things are better with her fibromyalgia and that you don't foresee as many visits pertaining to the fibro. Please advise.

## 2011-10-03 NOTE — Telephone Encounter (Signed)
Letter printed. She can pick up when she likes.

## 2011-10-04 NOTE — Telephone Encounter (Signed)
Pt informed. Will send her husband to pickup letter.

## 2011-10-14 ENCOUNTER — Telehealth: Payer: Self-pay | Admitting: *Deleted

## 2011-10-14 NOTE — Telephone Encounter (Signed)
PT states that she has been out of work all week with a stomach bug and wants a doctors note to go back to work on Monday. Please advise.

## 2011-10-14 NOTE — Telephone Encounter (Signed)
No documentation for [place] to give her a work note.

## 2011-10-14 NOTE — Telephone Encounter (Signed)
Pt informed

## 2011-10-19 ENCOUNTER — Encounter: Payer: Self-pay | Admitting: Family Medicine

## 2011-10-29 ENCOUNTER — Other Ambulatory Visit: Payer: Self-pay | Admitting: Family Medicine

## 2011-12-09 ENCOUNTER — Encounter: Payer: Self-pay | Admitting: Family Medicine

## 2011-12-09 ENCOUNTER — Ambulatory Visit (INDEPENDENT_AMBULATORY_CARE_PROVIDER_SITE_OTHER): Payer: BC Managed Care – PPO | Admitting: Family Medicine

## 2011-12-09 VITALS — BP 110/72 | HR 94 | Ht 64.0 in | Wt 130.0 lb

## 2011-12-09 DIAGNOSIS — M549 Dorsalgia, unspecified: Secondary | ICD-10-CM

## 2011-12-09 MED ORDER — KETOROLAC TROMETHAMINE 60 MG/2ML IM SOLN
60.0000 mg | Freq: Once | INTRAMUSCULAR | Status: AC
  Administered 2011-12-09: 60 mg via INTRAMUSCULAR

## 2011-12-09 MED ORDER — CYCLOBENZAPRINE HCL 10 MG PO TABS: 10.0000 mg | ORAL_TABLET | Freq: Every evening | ORAL | Status: DC | PRN

## 2011-12-09 NOTE — Progress Notes (Signed)
  Subjective:    Patient ID: Tara Howard, female    DOB: 1971-03-30, 41 y.o.   MRN: [id number]  HPI Mid back pain x 2 weeks.  Says went to see massage therapist and that helped.  Then went to work out on the elliptical and got worse.  Taking EX Tylenol. Then tried naproxen. Helps some.  Started out as muscle spasms. It has gradually eased.   Worse in the AM. Has been using heat at night.  No radiation of pain.  Hx of GERD. So far naproxen not upsetting her stomach.   Review of Systems     Objective:   Physical Exam  Constitutional: She appears well-developed and well-nourished.  Musculoskeletal:       Normal lumbar flexion and extension. Normal rotation right and left. She is nontender over the thoracic spine but she is tender over the thoracic paraspinous muscles. Lower extremity reflexes 2+ bilaterally. The muscle still felt very tight and spasms in the thoracic area.          Assessment & Plan:  Mid Back Pain - givne H.O on stretches. Will add muscle relaxer at bedtime.  Would like to try the tramadol injection.  She will try to see her massage therapist as well. If she is not continuing to get better in the next week then please give me a call and consider further workup with x-ray. We also discussed possibility of physical therapy. I do not feel patient is interested at this time. Continue heat as needed. I did give her samples of Vimovo to take twice a day for the next few days to avoid any GI irritation with her history of reflux.

## 2011-12-09 NOTE — Patient Instructions (Signed)
Mid-Back Strain with Rehab   A strain is an injury in which a tendon or muscle is torn. The muscles and tendons of the mid-back are vulnverable to strains. However, these muscles and tendons are very strong and require a great force to be injured. The muscles of the mid-back are responsible for stabilizing the spinal column, as well as spinal twisting (rotation). Strains are classified into three categories. Grade 1 strains cause pain, but the tendon is not lengthened. Grade 2 strains include a lengthened ligament, due to the ligament being stretched or partially ruptured. With grade 2 strains there is still function, although the function may be decreased. Grade 3 strains involve a complete tear of the tendon or muscle, and function is usually impaired. SYMPTOMS    Pain in the middle of the back.   Pain that may affect only one side, and is worse with movement.   Muscle spasms, and often swelling in the back.   Loss of strength of the back muscles.   Crackling sound (crepitation) when the muscles are touched.  CAUSES   Mid-back strains occur when a force is placed on the muscles or tendons that is greater than they can handle. Common causes of injury include:  Ongoing overuse of the muscle-tendon units in the middle back, usually from incorrect body posture.   A single violent injury or force applied to the back.  RISK INCREASES WITH:  Sports that involve twisting forces on the spine or a lot of bending at the waist (football, rugby, weightlifting, bowling, golf, tennis, speed skating, racquetball, swimming, running, gymnastics, diving).   Poor strength and flexibility.   Failure to warm up properly before activity.   Family history of low back pain or disk disorders.   Previous back injury or surgery (especially fusion).  PREVENTION  Learn and use proper sports technique.   Warm up and stretch properly before activity.   Allow for adequate recovery between workouts.    Maintain physical fitness:   Strength, flexibility, and endurance.   Cardiovascular fitness.  PROGNOSIS   If treated properly, mid-back strains usually heal within 6 weeks. RELATED COMPLICATIONS    Frequently recurring symptoms, resulting in a chronic problem. Properly treating the problem the first time decreases frequency of recurrence.   Chronic inflammation, scarring, and partial muscle-tendon tear.   Delayed healing or resolution of symptoms, especially if activity is resumed too soon.   Prolonged disability.  TREATMENT Treatment first involves the use of ice and medicine, to reduce pain and inflammation. As the pain begins to subside, you may begin strengthening and stretching exercises to improve body posture and sport technique. These exercises may be performed at home or with a therapist. Severe injuries may require referral to a therapist for further evaluation and treatment, such as ultrasound. Corticosteroid injections may be given to help reduce inflammation. Biofeedback (watching monitors of your body processes) and psychotherapy may also be prescribed. Prolonged bed rest is felt to do more harm than good. Massage may help break the muscle spasms. Sometimes, an injection of cortisone, with or without local anesthetics, may be given to help relieve the pain and spasms. MEDICATION    If pain medicine is needed, nonsteroidal anti-inflammatory medicines (aspirin and ibuprofen), or other minor pain relievers (acetaminophen), are often advised.   Do not take pain medicine for 7 days before surgery.   Prescription pain relievers may be given, if your caregiver thinks they are needed. Use only as directed and only as much as you   need.   Ointments applied to the skin may be helpful.   Corticosteroid injections may be given by your caregiver. These injections should be reserved for the most serious cases, because they may only be given a certain number of times.  HEAT AND COLD:     Cold treatment (icing) should be applied for 10 to 15 minutes every 2 to 3 hours for inflammation and pain, and immediately after activity that aggravates your symptoms. Use ice packs or an ice massage.   Heat treatment may be used before performing stretching and strengthening activities prescribed by your caregiver, physical therapist, or athletic trainer. Use a heat pack or a warm water soak.  SEEK IMMEDIATE MEDICAL CARE IF:  Symptoms get worse or do not improve in 2 to 4 weeks, despite treatment.   You develop numbness, weakness, or loss of bowel or bladder function.   New, unexplained symptoms develop. (Drugs used in treatment may produce side effects.)  EXERCISES RANGE OF MOTION (ROM) AND STRETCHING EXERCISES - Mid-Back Strain These exercises may help you when beginning to rehabilitate your injury. In order to successfully resolve your symptoms, you must improve your posture. These exercises are designed to help reduce the forward-head and rounded-shoulder posture which contributes to this condition. Your symptoms may resolve with or without further involvement from your physician, physical therapist or athletic trainer. While completing these exercises, remember:    Restoring tissue flexibility helps normal motion to return to the joints. This allows healthier, less painful movement and activity.   An effective stretch should be held for at least 30 seconds.   A stretch should never be painful. You should only feel a gentle lengthening or release in the stretched tissue.  STRETECH - Axial Extension  Stand or sit on a firm surface. Assume a good posture: chest up, shoulders drawn back, stomach muscles slightly tense, knees unlocked (if standing) and feet hip width apart.   Slowly retract your chin, so your head slides back and your chin slightly lowers. Continue to look straight ahead.   You should feel a gentle stretch in the back of your head. Be certain not to feel an aggressive  stretch since this can cause headaches later.   Hold for __________ seconds.  Repeat __________ times. Complete this exercise __________ times per day. RANGE OF MOTION- Upper Thoracic Extension  Sit on a firm chair with a high back. Assume a good posture: chest up, shoulders drawn back, abdominal muscles slightly tense, and feet hip width apart. Place a small pillow or folded towel in the curve of your lower back, if you are having difficulty maintaining good posture.   Gently brace your neck with your hands, allowing your arms to rest on your chest.   Continue to support your neck and slowly extend your back over the chair. You will feel a stretch across your upper back.   Hold __________ seconds. Slowly return to the starting position.  Repeat __________ times. Complete this exercise __________ times per day. RANGE OF MOTION- Mid-Thoracic Extension  Roll a towel so that it is about 4 inches in diameter.   Position the towel lengthwise. Lay on the towel so that your spine, but not your shoulder blades, are supported.   You should feel your mid-back arching toward the floor. To increase the stretch, extend your arms away from your body.   Hold for __________ seconds.  Repeat exercise __________ times, __________ times per day. STRENGTHENING EXERCISES - Mid-Back Strain These exercises may help   you when beginning to rehabilitate your injury. They may resolve your symptoms with or without further involvement from your physician, physical therapist or athletic trainer. While completing these exercises, remember:    Muscles can gain both the endurance and the strength needed for everyday activities through controlled exercises.   Complete these exercises as instructed by your physician, physical therapist or athletic trainer. Increase the resistance and repetitions only as guided by your caregiver.   You may experience muscle soreness or fatigue, but the pain or discomfort you are trying to  eliminate should never worsen during these exercises. If this pain does worsen, stop and make certain you are following the directions exactly. If the pain is still present after adjustments, discontinue the exercise until you can discuss the trouble with your caregiver.  STRENGTHENING - Quadruped, Opposite UE/LE Lift  Assume a hands and knees position on a firm surface. Keep your hands under your shoulders and your knees under your hips. You may place padding under your knees for comfort.   Find your neutral spine and gently tense your abdominal muscles so that you can maintain this position. Your shoulders and hips should form a rectangle that is parallel with the floor and is not twisted.   Keeping your trunk steady, lift your right hand no higher than your shoulder and then your left leg no higher than your hip. Make sure you are not holding your breath. Hold this position __________ seconds.   Continuing to keep your abdominal muscles tense and your back steady, slowly return to your starting position. Repeat with the opposite arm and leg.  Repeat __________ times. Complete this exercise __________ times per day.   STRENGTH - Shoulder Extensors  Secure a rubber exercise band or tubing to a fixed object (table, pole) so that it is at the height of your shoulders when you are either standing, or sitting on a firm armless chair.   With a thumbs-up grip, grasp an end of the band in each hand. Straighten your elbows and lift your hands straight in front of you at shoulder height. Step back away from the secured end of band, until it becomes tense.   Squeezing your shoulder blades together, pull your hands down to the sides of your thighs. Do not allow your hands to go behind you.   Hold for __________ seconds. Slowly ease the tension on the band, as you reverse the directions and return to the starting position.  Repeat __________ times. Complete this exercise __________ times per day.     STRENGTH - Horizontal Abductors Choose one of the two positions to complete this exercise. Prone: lying on stomach:  Lie on your stomach on a firm surface so that your right / left arm overhangs the edge. Rest your forehead on your opposite forearm. With your palm facing the floor and your elbow straight, hold a __________ weight in your hand.   Squeeze your right / left shoulder blade to your mid-back spine and then slowly raise your arm to the height of the bed.   Hold for __________ seconds. Slowly reverse the directions and return to the starting position, controlling the weight as you lower your arm.  Repeat __________ times. Complete this exercise __________ times per day. Standing:   Secure a rubber exercise band or tubing, so that it is at the height of your shoulders when you are either standing, or sitting on a firm armless chair.   Grasp an end of the band in each   hand and have your palms face each other. Straighten your elbows and lift your hands straight in front of you at shoulder height. Step back away from the secured end of band, until it becomes tense.   Squeeze your shoulder blades together. Keeping your elbows locked and your hands at shoulder height, spread your arms apart, forming a "T" shape with your body. Hold __________ seconds. Slowly ease the tension on the band, as you reverse the directions and return to the starting position.  Repeat __________ times. Complete this exercise __________ times per day. STRENGTH - Scapular Retractors and External Rotators, Rowing  Secure a rubber exercise band or tubing, so that it is at the height of your shoulders when you are either standing, or sitting on a firm armless chair.   With a palm-down grip, grasp an end of the band in each hand. Straighten your elbows and lift your hands straight in front of you at shoulder height. Step back away from the secured end of band, until it becomes tense.   Step 1: Squeeze your shoulder  blades together. Bending your elbows, draw your hands to your chest as if you are rowing a boat. At the end of this motion, your hands and elbow should be at shoulder height and your elbows should be out to your sides.   Step 2: Rotate your shoulder to raise your hands above your head. Your forearms should be vertical and your upper arms should be horizontal.   Hold for __________ seconds. Slowly ease the tension on the band, as you reverse the directions and return to the starting position.  Repeat __________ times. Complete this exercise __________ times per day.   POSTURE AND BODY MECHANICS CONSIDERATIONS - Mid-Back Strain Keeping correct posture when sitting, standing or completing your activities will reduce the stress put on different body tissues, allowing injured tissues a chance to heal and limiting painful experiences. The following are general guidelines for improved posture. Your physician or physical therapist will provide you with any instructions specific to your needs. While reading these guidelines, remember:  The exercises prescribed by your provider will help you have the flexibility and strength to maintain correct postures.   The correct posture provides the best environment for your joints to work. All of your joints have less wear and tear when properly supported by a spine with good posture. This means you will experience a healthier, less painful body.   Correct posture must be practiced with all of your activities, especially prolonged sitting and standing. Correct posture is as important when doing repetitive low-stress activities (typing) as it is when doing a single heavy-load activity (lifting).  PROPER SITTING POSTURE In order to minimize stress and discomfort on your spine, you must sit with correct posture. Sitting with good posture should be effortless for a healthy body. Returning to good posture is a gradual process. Many people can work toward this most comfortably  by using various supports until they have the flexibility and strength to maintain this posture on their own. When sitting with proper posture, your ears will fall over your shoulders and your shoulders will fall over your hips. You should use the back of the chair to support your upper back. Your lower back will be in a neutral position, just slightly arched. You may place a small pillow or folded towel at the base of your low back for   support.   When working at a desk, create an environment that supports good, upright   posture. Without extra support, muscles fatigue and lead to excessive strain on joints and other tissues. Keep these recommendations in mind: CHAIR:  A chair should be able to slide under your desk when your back makes contact with the back of the chair. This allows you to work closely.   The chair's height should allow your eyes to be level with the upper part of your monitor and your hands to be slightly lower than your elbows.  BODY POSITION  Your feet should make contact with the floor. If this is not possible, use a foot rest.   Keep your ears over your shoulders. This will reduce stress on your neck and lower back.  INCORRECT SITTING POSTURES If you are feeling tired and unable to assume a healthy sitting posture, do not slouch or slump. This puts excessive strain on your back tissues, causing more damage and pain. Healthier options include:  Using more support, like a lumbar pillow.   Switching tasks to something that requires you to be upright or walking.   Talking a brief walk.   Lying down to rest in a neutral-spine position.  CORRECT STANDING POSTURES Proper standing posture should be assumed with all daily activities, even if they only take a few moments, like when brushing your teeth. As in sitting, your ears should fall over your shoulders and your shoulders should fall over your hips. You should keep a slight tension in your abdominal muscles to brace your  spine. Your tailbone should point down to the ground, not behind your body, resulting in an over-extended swayback posture.   INCORRECT STANDING POSTURES Common incorrect standing postures include a forward head, locked knees, and an excessive swayback. WALKING Walk with an upright posture. Your ears, shoulders and hips should all line-up. CORRECT LIFTING TECHNIQUES DO :   Assume a wide stance. This will provide you more stability and the opportunity to get as close as possible to the object which you are lifting.   Tense your abdominals to brace your spine. Bend at the knees and hips. Keeping your back locked in a neutral-spine position, lift using your leg muscles. Lift with your legs, keeping your back straight.   Test the weight of unknown objects before attempting to lift them.   Try to keep your elbows locked down at your sides in order get the best strength from your shoulders when carrying an object.   Always ask for help when lifting heavy or awkward objects.  INCORRECT LIFTING TECHNIQUES DO NOT:   Lock your knees when lifting, even if it is a small object.   Bend and twist. Pivot at your feet or move your feet when needing to change directions.   Assume that you can safely pick up even a paperclip without proper posture.  Document Released: 07/11/2005 Document Revised: 06/30/2011 Document Reviewed: 10/23/2008 [employer] Patient Information 2012 [employer]. 

## 2011-12-29 ENCOUNTER — Other Ambulatory Visit: Payer: Self-pay | Admitting: Family Medicine

## 2012-01-28 ENCOUNTER — Other Ambulatory Visit: Payer: Self-pay | Admitting: Family Medicine

## 2012-02-28 ENCOUNTER — Other Ambulatory Visit: Payer: Self-pay | Admitting: Family Medicine

## 2012-03-07 ENCOUNTER — Other Ambulatory Visit: Payer: Self-pay | Admitting: Family Medicine

## 2012-03-08 ENCOUNTER — Ambulatory Visit: Payer: BC Managed Care – PPO | Admitting: Family Medicine

## 2012-04-26 ENCOUNTER — Other Ambulatory Visit: Payer: Self-pay | Admitting: Family Medicine

## 2012-05-07 ENCOUNTER — Ambulatory Visit: Payer: BC Managed Care – PPO | Admitting: Family Medicine

## 2012-05-07 DIAGNOSIS — Z0289 Encounter for other administrative examinations: Secondary | ICD-10-CM

## 2012-05-25 ENCOUNTER — Ambulatory Visit (INDEPENDENT_AMBULATORY_CARE_PROVIDER_SITE_OTHER): Payer: BC Managed Care – PPO | Admitting: Family Medicine

## 2012-05-25 ENCOUNTER — Encounter: Payer: Self-pay | Admitting: Family Medicine

## 2012-05-25 VITALS — BP 111/69 | HR 70 | Wt 133.0 lb

## 2012-05-25 DIAGNOSIS — M546 Pain in thoracic spine: Secondary | ICD-10-CM

## 2012-05-25 DIAGNOSIS — F39 Unspecified mood [affective] disorder: Secondary | ICD-10-CM

## 2012-05-25 DIAGNOSIS — M549 Dorsalgia, unspecified: Secondary | ICD-10-CM

## 2012-05-25 DIAGNOSIS — Z23 Encounter for immunization: Secondary | ICD-10-CM

## 2012-05-25 DIAGNOSIS — J309 Allergic rhinitis, unspecified: Secondary | ICD-10-CM

## 2012-05-25 DIAGNOSIS — Z298 Encounter for other specified prophylactic measures: Secondary | ICD-10-CM

## 2012-05-25 DIAGNOSIS — J45909 Unspecified asthma, uncomplicated: Secondary | ICD-10-CM

## 2012-05-25 MED ORDER — CYCLOBENZAPRINE HCL 10 MG PO TABS: 5.0000 mg | ORAL_TABLET | Freq: Every evening | ORAL | Status: DC | PRN

## 2012-05-25 MED ORDER — BECLOMETHASONE DIPROPIONATE 40 MCG/ACT IN AERS: 2.0000 | INHALATION_SPRAY | Freq: Two times a day (BID) | RESPIRATORY_TRACT | Status: DC

## 2012-05-25 MED ORDER — METAXALONE 800 MG PO TABS: 800.0000 mg | ORAL_TABLET | Freq: Every day | ORAL | Status: DC | PRN

## 2012-05-25 MED ORDER — FLUOXETINE HCL 20 MG PO CAPS: 20.0000 mg | ORAL_CAPSULE | Freq: Every day | ORAL | Status: DC

## 2012-05-25 NOTE — Progress Notes (Signed)
  Subjective:    Patient ID: Tara Howard, female    DOB: 25-May-1971, 41 y.o.   MRN: [id number]  HPI Asthma/Allergies - doing well overall . Had a cold recently and had to use her rescue inhaler once.  Trying to get back into running.   Mood disorder-overall she's doing well on the fluoxetine. She did try to come off of it about a year ago and that did not do well. She's not interested in stopping it. She's happy with her current dose. She does need refills.  She's had some upper back pain over the last couple weeks. She quit working out about a month or so ago after she was on vacation. She says she just hasn't started running again. She also has a new job is very stressful. She's had a lot of tension in her upper back. She has been applying warm moist heat. She can go for massage once but was not helpful. She tries not to take NSAIDs because it really bothers her stomach. She wants and if she can have a refill on the Flexeril. She also wants and if there's a different muscle relaxer that she can take during the daytime.   Review of Systems     Objective:   Physical Exam  Constitutional: She is oriented to person, place, and time. She appears well-developed and well-nourished.  HENT:  Head: Normocephalic and atraumatic.  Neck: Neck supple. No thyromegaly present.  Cardiovascular: Normal rate, regular rhythm and normal heart sounds.   Pulmonary/Chest: Effort normal and breath sounds normal.  Lymphadenopathy:    She has no cervical adenopathy.  Neurological: She is alert and oriented to person, place, and time.  Skin: Skin is warm and dry.  Psychiatric: She has a normal mood and affect. Her behavior is normal.          Assessment & Plan:  Asthma/Allergies - overall she's been doing well. She only recently had a flare about 2 weeks ago when she had a cold. Of depression she had to use her rescue inhaler in about 2 months. We will try dropping her down to just an inhaled  corticosteroid. Will change to Qvar. If she starts to get more flares especially if she starts to exercise please let me know and we can always put her back up to [facility]. Continue her allergy medications through the fall. Followup in 6 months.  Mood disorder-she wants to stay on the fluoxetine. Continue current regimen. Followup in 6 months.  Upper back pain-most likely muscle spasm based on her description. Continue with warm moist heat. Since NSAIDs 2 bothersome to recommend that we avoid these. We could consider Celebrex though likely her insurance will cover it. Will refill the Flexeril for bedtime. I did give her prescription for Skelaxin which she can use during the daytime. Still warned about potential for sedation. If after couple weeks with gentle stretches and warm moist heat and a muscle relaxer she's not significantly better then please call the office back and let us know. Could consider physical therapy. The she says she checked into it and it will likely be cost prohibitive with her insurance.

## 2012-07-17 ENCOUNTER — Other Ambulatory Visit: Payer: Self-pay | Admitting: Family Medicine

## 2012-08-07 IMAGING — CR DG ABDOMEN 1V
1 series · 1 of 1 positions shown · non-contrast
Comparison: None.

CLINICAL DATA: Abdominal pain, nausea, constipation

ABDOMEN - 1 VIEW

[view not recorded]
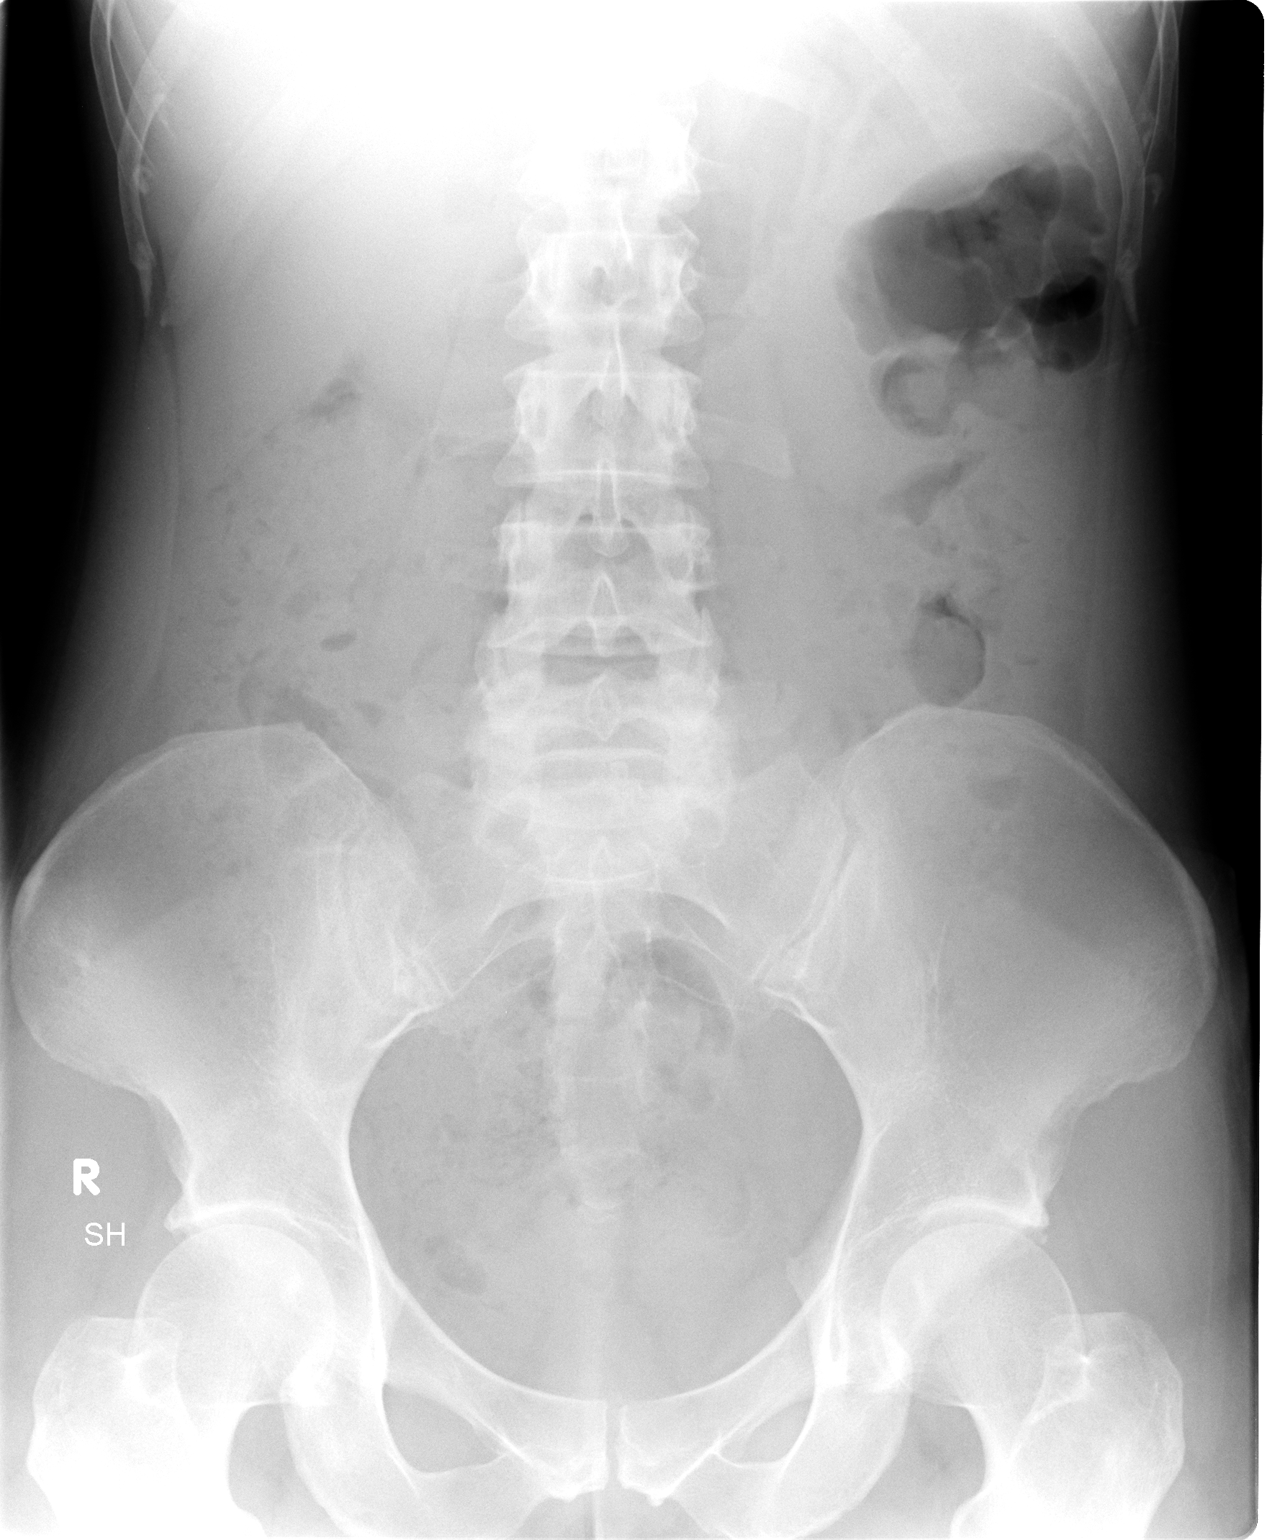

[1 of 1 positions shown; findings below may reference images not displayed]

FINDINGS: A supine film of the abdomen shows only a moderate amount
of feces throughout the colon.  No bowel obstruction is seen.  No
opaque calculi are noted.  The bones are unremarkable.
IMPRESSION: Nonspecific bowel gas pattern.

## 2012-08-28 ENCOUNTER — Other Ambulatory Visit: Payer: Self-pay | Admitting: Family Medicine

## 2012-09-13 ENCOUNTER — Other Ambulatory Visit: Payer: Self-pay | Admitting: Family Medicine

## 2012-10-14 ENCOUNTER — Other Ambulatory Visit: Payer: Self-pay | Admitting: Family Medicine

## 2012-11-27 ENCOUNTER — Other Ambulatory Visit: Payer: Self-pay | Admitting: *Deleted

## 2012-11-27 MED ORDER — TRAZODONE HCL 100 MG PO TABS: ORAL_TABLET | ORAL | Status: DC

## 2012-11-27 NOTE — Progress Notes (Signed)
Pt called in wanting refill of trazadone given 10 tablets until she comes in on 11-30-2012 .Loralee Pacas Earling

## 2012-11-30 ENCOUNTER — Encounter: Payer: Self-pay | Admitting: Family Medicine

## 2012-11-30 ENCOUNTER — Ambulatory Visit (INDEPENDENT_AMBULATORY_CARE_PROVIDER_SITE_OTHER): Payer: BC Managed Care – PPO | Admitting: Family Medicine

## 2012-11-30 VITALS — BP 118/77 | HR 95 | Wt 140.0 lb

## 2012-11-30 DIAGNOSIS — K219 Gastro-esophageal reflux disease without esophagitis: Secondary | ICD-10-CM

## 2012-11-30 DIAGNOSIS — J45909 Unspecified asthma, uncomplicated: Secondary | ICD-10-CM

## 2012-11-30 DIAGNOSIS — G4700 Insomnia, unspecified: Secondary | ICD-10-CM | POA: Insufficient documentation

## 2012-11-30 DIAGNOSIS — IMO0001 Reserved for inherently not codable concepts without codable children: Secondary | ICD-10-CM

## 2012-11-30 MED ORDER — LEVALBUTEROL TARTRATE 45 MCG/ACT IN AERO: 2.0000 | INHALATION_SPRAY | Freq: Four times a day (QID) | RESPIRATORY_TRACT | Status: DC | PRN

## 2012-11-30 MED ORDER — BECLOMETHASONE DIPROPIONATE 40 MCG/ACT IN AERS: 1.0000 | INHALATION_SPRAY | Freq: Two times a day (BID) | RESPIRATORY_TRACT | Status: DC

## 2012-11-30 MED ORDER — TRAZODONE HCL 100 MG PO TABS: 150.0000 mg | ORAL_TABLET | Freq: Every day | ORAL | Status: DC

## 2012-11-30 MED ORDER — FLUOXETINE HCL 20 MG PO CAPS: 20.0000 mg | ORAL_CAPSULE | Freq: Every day | ORAL | Status: DC

## 2012-11-30 NOTE — Progress Notes (Signed)
  Subjective:    Patient ID: Tara Howard, female    DOB: 11-28-1970, 42 y.o.   MRN: [id number]  HPI Fibromyalgia-says overall dong well. 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  Using her flexeril at bedtime prn. Sometime keeps her awake so tries not to use it.   GERD - She is using Nexium BID and has been watching her diet. Still occ flares for a day or sore. She see Lindley Magnus, PA at the [facility] office.   Asthma - She is using QVAR instead of [place].  She has not had any recent asthma flares.  She rarely has to use her Xopenex. She has not tolerated regular albuterol the past because of tachycardia, lightheadedness and dizziness. She says spring is usually a triple sometime in the year but so far she's doing well. She is taking her Zyrtec.  Still on fluoxetine. Says doesn't do well when she tries to wean off.  She would like to continue it for now.  Review of Systems     Objective:   Physical Exam  Constitutional: She is oriented to person, place, and time. She appears well-developed and well-nourished.  HENT:  Head: Normocephalic and atraumatic.  Cardiovascular: Normal rate, regular rhythm and normal heart sounds.   Pulmonary/Chest: Effort normal and breath sounds normal.  Neurological: She is alert and oriented to person, place, and time.  Skin: Skin is warm and dry.  Psychiatric: She has a normal mood and affect. Her behavior is normal.          Assessment & Plan:  Fibromyalgia-Doing well overall.  She's using her Flexeril sparingly and not nightly which is fantastic.  GERD - Discussed decreasing dose to once a dya. Says increased risk of fractures with long-term use of PPIs. Continue to work on reflux measures and try to wean her PPI down to once a day. She is currently being followed at [facility].  Insomnia - she's actually decreased her dose of trazodone to one half tabs which is fantastic. I encouraged her to work on continuing to wean the dose as she tolerates it. Also continue  work on good sleep hygiene. It sounds like she overall does a good job with this. She says at one point, physician had suggested her getting a sleep study. I'm not sure that this will really prove fruitful but certainly we could do that. She could have something going on such as periodic limb movement which could be contributing to poor sleep quality. I encouraged her to ask her husband if she has a lot of limb movements at night. She does have restless leg syndrome.  Asthma - overall she's really done fantastic on the Qvar. She was previously on [place]. I explained her that at some point time while she is well regulated we could try weaning her completely off the Qvar and just use her rescue inhaler. This spring is a time where her symptoms typically get exacerbated. So I do not recommend weaning off at this time. I did refill her prescriptions today. Doesn't like her second axis no longer covered on her insurance. She says she tends to get tachycardia and lightheadedness and dizziness with prior air, Ventolin and Proventil.

## 2012-11-30 NOTE — Patient Instructions (Signed)
Sleep Apnea  Sleep apnea is a sleep disorder characterized by abnormal pauses in breathing while you sleep. When your breathing pauses, the level of oxygen in your blood decreases. This causes you to move out of deep sleep and into light sleep. As a result, your quality of sleep is poor, and the system that carries your blood throughout your body (cardiovascular system) experiences stress. If sleep apnea remains untreated, the following conditions can develop:  High blood pressure (hypertension).  Coronary artery disease.  Inability to achieve or maintain an erection (impotence).  Impairment of your thought process (cognitive dysfunction). There are three types of sleep apnea: 1. Obstructive sleep apnea Pauses in breathing during sleep because of a blocked airway. 2. Central sleep apnea Pauses in breathing during sleep because the area of the brain that controls your breathing does not send the correct signals to the muscles that control breathing. 3. Mixed sleep apnea A combination of both obstructive and central sleep apnea. RISK FACTORS The following risk factors can increase your risk of developing sleep apnea:  Being overweight.  Smoking.  Having narrow passages in your nose and throat.  Being of older age.  Being female.  Alcohol use.  Sedative and tranquilizer use.  Ethnicity. Among individuals younger than 35 years, African Americans are at increased risk of sleep apnea. SYMPTOMS   Difficulty staying asleep.  Daytime sleepiness and fatigue.  Loss of energy.  Irritability.  Loud, heavy snoring.  Morning headaches.  Trouble concentrating.  Forgetfulness.  Decreased interest in sex. DIAGNOSIS  In order to diagnose sleep apnea, your caregiver will perform a physical examination. Your caregiver may suggest that you take a home sleep test. Your caregiver may also recommend that you spend the night in a sleep lab. In the sleep lab, several monitors record  information about your heart, lungs, and brain while you sleep. Your leg and arm movements and blood oxygen level are also recorded. TREATMENT The following actions may help to resolve mild sleep apnea:  Sleeping on your side.   Using a decongestant if you have nasal congestion.   Avoiding the use of depressants, including alcohol, sedatives, and narcotics.   Losing weight and modifying your diet if you are overweight. There also are devices and treatments to help open your airway:  Oral appliances. These are custom-made mouthpieces that shift your lower jaw forward and slightly open your bite. This opens your airway.  Devices that create positive airway pressure. This positive pressure "splints" your airway open to help you breathe better during sleep. The following devices create positive airway pressure:  Continuous positive airway pressure (CPAP) device. The CPAP device creates a continuous level of air pressure with an air pump. The air is delivered to your airway through a mask while you sleep. This continuous pressure keeps your airway open.  Nasal expiratory positive airway pressure (EPAP) device. The EPAP device creates positive air pressure as you exhale. The device consists of single-use valves, which are inserted into each nostril and held in place by adhesive. The valves create very little resistance when you inhale but create much more resistance when you exhale. That increased resistance creates the positive airway pressure. This positive pressure while you exhale keeps your airway open, making it easier to breath when you inhale again.  Bilevel positive airway pressure (BPAP) device. The BPAP device is used mainly in patients with central sleep apnea. This device is similar to the CPAP device because it also uses an air pump to deliver   continuous air pressure through a mask. However, with the BPAP machine, the pressure is set at two different levels. The pressure when you  exhale is lower than the pressure when you inhale.  Surgery. Typically, surgery is only done if you cannot comply with less invasive treatments or if the less invasive treatments do not improve your condition. Surgery involves removing excess tissue in your airway to create a wider passage way. Document Released: 07/01/2002 Document Revised: 01/10/2012 Document Reviewed: 11/17/2011 [employer] Patient Information 2013 [employer].  

## 2013-01-21 ENCOUNTER — Encounter: Payer: Self-pay | Admitting: Family Medicine

## 2013-01-21 ENCOUNTER — Ambulatory Visit (INDEPENDENT_AMBULATORY_CARE_PROVIDER_SITE_OTHER): Payer: BC Managed Care – PPO | Admitting: Family Medicine

## 2013-01-21 VITALS — BP 113/71 | HR 74 | Temp 98.3°F | Wt 137.0 lb

## 2013-01-21 DIAGNOSIS — J019 Acute sinusitis, unspecified: Secondary | ICD-10-CM

## 2013-01-21 DIAGNOSIS — M545 Low back pain: Secondary | ICD-10-CM

## 2013-01-21 MED ORDER — AMOXICILLIN-POT CLAVULANATE 875-125 MG PO TABS: 1.0000 | ORAL_TABLET | Freq: Two times a day (BID) | ORAL | Status: DC

## 2013-01-21 MED ORDER — CYCLOBENZAPRINE HCL 10 MG PO TABS: ORAL_TABLET | ORAL | Status: DC

## 2013-01-21 NOTE — Progress Notes (Signed)
  Subjective:    Patient ID: Tara Howard, female    DOB: Jun 10, 1971, 42 y.o.   MRN: [id number]  HPI Bilat sinus pain x 3 weeks. Ear pain and pressure.  ST initially and again last couple fo days.  No fever.  + HA, bilat.  Mild cough.  Tried otc nasal spray, zicam and tylenol sinus. Was taking Dayquail.  No recent ABX.  Mild nausea. Mild post nasal drip. Sinuses feel swollen. Has noticed some mild Low back pain as well over the same time frame.  No dysuria or hematuria but does have inc in urinary frequency.    Review of Systems     Objective:   Physical Exam  Constitutional: She is oriented to person, place, and time. She appears well-developed and well-nourished.  HENT:  Head: Normocephalic and atraumatic.  Right Ear: External ear normal.  Left Ear: External ear normal.  Nose: Nose normal.  Mouth/Throat: Oropharynx is clear and moist.  TMs and canals are clear.   Eyes: Conjunctivae and EOM are normal. Pupils are equal, round, and reactive to light.  Neck: Neck supple. No thyromegaly present.  bilat ant cerv LN  Cardiovascular: Normal rate, regular rhythm and normal heart sounds.   Pulmonary/Chest: Effort normal and breath sounds normal. She has no wheezes.  Lymphadenopathy:    She has cervical adenopathy.  Neurological: She is alert and oriented to person, place, and time.  Skin: Skin is warm and dry.  Psychiatric: She has a normal mood and affect.          Assessment & Plan:  Acute sinusitis - Will tx with agumentin. Call if not significantly better in one week. Can continue symptomatic care as needed. Make sure drinking plan fluids.  Low back pain-she was unable to give a urine sample here so I gave her a urine cup as well as in order to collect at home especially if it's not improving in the next couple of days. If urinalysis is negative for possible UTI and her back pain is not improving as her cough improves then consider further evaluation for musculoskeletal  disorder.

## 2013-01-21 NOTE — Patient Instructions (Signed)

## 2013-01-22 ENCOUNTER — Telehealth: Payer: Self-pay | Admitting: *Deleted

## 2013-01-22 MED ORDER — SULFAMETHOXAZOLE-TMP DS 800-160 MG PO TABS: 1.0000 | ORAL_TABLET | Freq: Two times a day (BID) | ORAL | Status: DC

## 2013-01-22 NOTE — Telephone Encounter (Signed)
Pt calls today & states that the abx she was given caused her to have "the worst cramps of her life", diarrhea & vomiting.  She would like something dif sent in.  Please advise

## 2013-01-22 NOTE — Telephone Encounter (Signed)
I added this medication to her intolerance list and then over a new prescription for Bactrim.

## 2013-01-22 NOTE — Telephone Encounter (Signed)
Pt.notified

## 2013-01-28 ENCOUNTER — Other Ambulatory Visit: Payer: Self-pay | Admitting: *Deleted

## 2013-01-28 ENCOUNTER — Telehealth: Payer: Self-pay | Admitting: *Deleted

## 2013-01-28 MED ORDER — DOXYCYCLINE HYCLATE 100 MG PO TABS: 100.0000 mg | ORAL_TABLET | Freq: Two times a day (BID) | ORAL | Status: DC

## 2013-01-28 MED ORDER — FLUTICASONE PROPIONATE 50 MCG/ACT NA SUSP: 1.0000 | Freq: Two times a day (BID) | NASAL | Status: DC

## 2013-01-28 NOTE — Telephone Encounter (Signed)
Pt calls and states that the antibiotic you put her on is not working and feels actually worse and wants to know if you could send in another med to her pharmacy. Barry Dienes, LPN

## 2013-01-28 NOTE — Telephone Encounter (Signed)
Ok, new rx sent 

## 2013-03-14 ENCOUNTER — Telehealth: Payer: Self-pay | Admitting: *Deleted

## 2013-03-14 MED ORDER — BECLOMETHASONE DIPROPIONATE 40 MCG/ACT IN AERS: 1.0000 | INHALATION_SPRAY | Freq: Two times a day (BID) | RESPIRATORY_TRACT | Status: DC

## 2013-03-14 NOTE — Telephone Encounter (Signed)
No definitely want her back on for ragweed season. Ok to send refills.

## 2013-03-14 NOTE — Telephone Encounter (Signed)
Pt notified via VM that refills sent to pharmacy and to stay on the Qvar. Barry Dienes, LPN

## 2013-03-14 NOTE — Telephone Encounter (Signed)
Pt calls and states back in May you all had discussed weaning her off the Qvar once Spring was over.  Pt states now its ragweed and she needs a refill on the Qvar and wanted to know if this was ok or does she need to still wean off?

## 2013-05-24 ENCOUNTER — Other Ambulatory Visit: Payer: Self-pay | Admitting: Family Medicine

## 2013-07-07 ENCOUNTER — Other Ambulatory Visit: Payer: Self-pay | Admitting: Family Medicine

## 2013-08-20 ENCOUNTER — Ambulatory Visit: Payer: BC Managed Care – PPO | Admitting: Family Medicine

## 2013-08-23 ENCOUNTER — Encounter: Payer: Self-pay | Admitting: Family Medicine

## 2013-08-23 ENCOUNTER — Ambulatory Visit (INDEPENDENT_AMBULATORY_CARE_PROVIDER_SITE_OTHER): Payer: BC Managed Care – PPO | Admitting: Family Medicine

## 2013-08-23 VITALS — BP 109/69 | HR 83 | Resp 16 | Wt 138.0 lb

## 2013-08-23 DIAGNOSIS — K589 Irritable bowel syndrome without diarrhea: Secondary | ICD-10-CM

## 2013-08-23 DIAGNOSIS — G4700 Insomnia, unspecified: Secondary | ICD-10-CM

## 2013-08-23 DIAGNOSIS — J45909 Unspecified asthma, uncomplicated: Secondary | ICD-10-CM

## 2013-08-23 DIAGNOSIS — IMO0001 Reserved for inherently not codable concepts without codable children: Secondary | ICD-10-CM

## 2013-08-23 MED ORDER — BECLOMETHASONE DIPROPIONATE 40 MCG/ACT IN AERS: 1.0000 | INHALATION_SPRAY | Freq: Two times a day (BID) | RESPIRATORY_TRACT | Status: DC

## 2013-08-23 MED ORDER — CYCLOBENZAPRINE HCL 10 MG PO TABS: ORAL_TABLET | ORAL | Status: DC

## 2013-08-23 MED ORDER — ESOMEPRAZOLE MAGNESIUM 40 MG PO CPDR: 40.0000 mg | DELAYED_RELEASE_CAPSULE | Freq: Every day | ORAL | Status: DC

## 2013-08-23 MED ORDER — TRAZODONE HCL 100 MG PO TABS: 150.0000 mg | ORAL_TABLET | Freq: Every day | ORAL | Status: DC

## 2013-08-23 MED ORDER — FLUTICASONE PROPIONATE 50 MCG/ACT NA SUSP: NASAL | Status: DC

## 2013-08-23 MED ORDER — FLUOXETINE HCL 20 MG PO CAPS: 20.0000 mg | ORAL_CAPSULE | Freq: Every day | ORAL | Status: DC

## 2013-08-23 MED ORDER — LEVALBUTEROL TARTRATE 45 MCG/ACT IN AERO: 2.0000 | INHALATION_SPRAY | Freq: Four times a day (QID) | RESPIRATORY_TRACT | Status: DC | PRN

## 2013-08-23 NOTE — Progress Notes (Signed)
   Subjective:    Patient ID: Tara Howard, female    DOB: 24-Feb-1971, 43 y.o.   MRN: [id number]  HPI Asthma - A few days of using her rescue inhaler. Using albuterol ess than twice a week most of the time that she has had a few weeks over the holidays where she didn't have to use it 3 or 4 times during the week..  She is on her QVAR daily.   Insomnia- using her trazodone at night.  occ takes 1.5.  Still has some difficulty falling asleep the medication definitely helps. She would like a refill on that today.  Mood d/O she is feeling and she is a Surveyor, [employer].  She is happy with her new job.  She is still working on sleep. Still having hard time falling sleep.  Says still with low energy. Occ will increase dose to 1.5 pills on the trazodone.  She enjoys walking but says really hates working out. Wants to continue her prozac.  Has come off in the past and did not do well so she would like to continue it for now.  IBS-she's actually off of additional medications. She is mostly doing Benefiber and seems to be well regulated. She's been released to see GI only once a year at this point time. They have requested that I refill her Nexium in the interim period is perfectly fine. Review of Systems     Objective:   Physical Exam  Constitutional: She is oriented to person, place, and time. She appears well-developed and well-nourished.  HENT:  Head: Normocephalic and atraumatic.  Cardiovascular: Normal rate, regular rhythm and normal heart sounds.   Pulmonary/Chest: Effort normal and breath sounds normal.  Neurological: She is alert and oriented to person, place, and time.  Skin: Skin is warm and dry.  Psychiatric: She has a normal mood and affect. Her behavior is normal.          Assessment & Plan:  Asthma - overall well controlled with Qvar and rescue inhaler. If she has more than 2 weeks at [place] she's having to use her albuterol inhaler more than twice a week and please let me know as we may  need to adjust her Qvar.  Insomnia - Mood d/o. Stable. Continue trazodone for now since it does seem to work well for her. Continue to work on sleep hygiene and recommend starting a regular exercise program. Explained her that studies do show improved quality of sleep and patient to exercise regularly. Refill sent to pharmacy. Followup in 6 months.  Fibromyalgia - Has felt better this week. Continue trazodone at night to help with sleep. Again reminded her about studies showing that regular exercise to help pain scores in patients with fibromyalgia. 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 Also try to find something that she enjoys affecting her treadmill which she says she hates doing.  IBS-she's actually off of additional medications. She is mostly doing Benefiber and seems to be well regulated. She's also on fluoxetine.  Mood disorder-happy with her fluoxetine. PHQ 9 score of 4 today. The symptoms that she marked worse sleep and feeling tired. No depressive symptoms whatsoever. Continue current regimen. Follow up in 6 months.

## 2013-08-28 ENCOUNTER — Telehealth: Payer: Self-pay | Admitting: *Deleted

## 2013-08-28 MED ORDER — OSELTAMIVIR PHOSPHATE 75 MG PO CAPS: 75.0000 mg | ORAL_CAPSULE | Freq: Two times a day (BID) | ORAL | Status: DC

## 2013-08-28 NOTE — Telephone Encounter (Signed)
Pt is having symptom of the flu and her daughter tested positive. Tamiflu sent to pharmacy per [facility]. Oscar La, LPN

## 2013-10-08 ENCOUNTER — Other Ambulatory Visit: Payer: Self-pay | Admitting: Family Medicine

## 2013-10-09 ENCOUNTER — Telehealth: Payer: Self-pay | Admitting: *Deleted

## 2013-10-09 MED ORDER — ESOMEPRAZOLE MAGNESIUM 40 MG PO CPDR: 40.0000 mg | DELAYED_RELEASE_CAPSULE | Freq: Every day | ORAL | Status: DC

## 2013-10-09 MED ORDER — ESOMEPRAZOLE MAGNESIUM 40 MG PO CPDR: 40.0000 mg | DELAYED_RELEASE_CAPSULE | Freq: Two times a day (BID) | ORAL | Status: DC | PRN

## 2013-10-09 NOTE — Telephone Encounter (Signed)
Pt states that the GI doctor originally filled it but states that they said if you wanted to do the refills then that is fine. She states that they told her that she can take daily and BID if needed. I have sent over 1 rx for a month and informed pt it was up to you if you continued to refill.  Oscar La, LPN

## 2013-10-11 ENCOUNTER — Encounter: Payer: Self-pay | Admitting: Physician Assistant

## 2013-10-11 ENCOUNTER — Ambulatory Visit (INDEPENDENT_AMBULATORY_CARE_PROVIDER_SITE_OTHER): Payer: BC Managed Care – PPO | Admitting: Physician Assistant

## 2013-10-11 VITALS — BP 115/63 | HR 96 | Temp 98.2°F | Ht 64.0 in | Wt 142.0 lb

## 2013-10-11 DIAGNOSIS — H6980 Other specified disorders of Eustachian tube, unspecified ear: Secondary | ICD-10-CM

## 2013-10-11 DIAGNOSIS — H9209 Otalgia, unspecified ear: Secondary | ICD-10-CM

## 2013-10-11 DIAGNOSIS — H9203 Otalgia, bilateral: Secondary | ICD-10-CM

## 2013-10-11 MED ORDER — METHYLPREDNISOLONE 4 MG PO KIT: PACK | ORAL | Status: DC

## 2013-10-11 NOTE — Progress Notes (Signed)
   Subjective:    Patient ID: Tara Howard, female    DOB: 04-08-1971, 43 y.o.   MRN: [id number]  HPI Pt is a 43 yo female who presents to the clinic with bilateral ear pain with left being worse greater than  Right. Patient denies any ear drainage. She's not had any fever, chills, cough, shortness of breath, wheezing, sore throat. She did have the flu at the beginning of February and then developed a sinus infection in the middle of February. She feels like she has not recovered  of infection. She still has off and on sinus pressure in her maxillary sinuses. She's currently not doing anything to make better with over-the-counter medications. She does occasionally have some headaches behind her eyes. She reports. Buzzing sound in her ears. She's been nothing to make better and nothing seems to make worse. She has been using Flonase daily.   Review of Systems     Objective:   Physical Exam  Constitutional: She is oriented to person, place, and time. She appears well-developed and well-nourished.  HENT:  Head: Normocephalic and atraumatic.  Nose: Nose normal.  Mouth/Throat: Oropharynx is clear and moist.  TMs appear retracted around ossicles bilaterally. There is signifcant scarring from past infections. No blood, erythema, pus. Appear very dry.   Nasal turbinates are red and swollen.  There was some maxillary and frontal sinus tenderness to palpation bilaterally.   Eyes: Conjunctivae are normal.  Neck: Normal range of motion. Neck supple.  Cardiovascular: Normal rate, regular rhythm and normal heart sounds.   Pulmonary/Chest: Effort normal and breath sounds normal. She has no wheezes.  Lymphadenopathy:    She has no cervical adenopathy.  Neurological: She is alert and oriented to person, place, and time.  Skin: Skin is dry.  Psychiatric: She has a normal mood and affect. Her behavior is normal.          Assessment & Plan:  Eustachian tube dysfunction-discuss with patient that  I do not see any signs of infection on exam today. Her TMs do look dry and retracted. To light there is some type of pressure occurring and causing the ear pain. Patient has been taking Flonase with no relief. I did give her samples of Q. nasal to start. If she likes this they can give her a prescription. I did give patient a Medrol Dosepak. Call office if not improving or if symptoms worsen. I did discuss certain techniques to help fully pressure near such as chewing gum and holding nose and mouth shot while blowing out.

## 2013-10-11 NOTE — Patient Instructions (Signed)
Start medrol dose pak. Try qnasl daily.   Chew gum, close nose and mouth and breath out.

## 2013-11-15 ENCOUNTER — Telehealth: Payer: Self-pay | Admitting: *Deleted

## 2013-11-15 ENCOUNTER — Other Ambulatory Visit: Payer: Self-pay | Admitting: Physician Assistant

## 2013-11-15 MED ORDER — BECLOMETHASONE DIPROPIONATE 80 MCG/ACT NA AERS: 2.0000 | INHALATION_SPRAY | Freq: Every day | NASAL | Status: DC

## 2013-11-15 NOTE — Telephone Encounter (Signed)
If she does not have coupon card please come by and pick up. Sent to pharmacy.

## 2013-11-15 NOTE — Telephone Encounter (Signed)
Pt notified med sent to pharmacy.  We are out of the coupon cards and made pt aware of this and she states that her pharmacist is real good about finding her a coupon. Clemetine Marker, LPN

## 2013-11-15 NOTE — Telephone Encounter (Signed)
Patient calls and states that the Q Nasal you gave her is working great and would like a rx sent to [facility].  Let pt know when sent.  Thanks. Clemetine Marker, LPN

## 2013-11-20 ENCOUNTER — Telehealth: Payer: Self-pay | Admitting: *Deleted

## 2013-11-20 NOTE — Telephone Encounter (Signed)
Prior auth obtained for Qnasal HFA.  Good from 3/*29/15 to 11/19/14. Clemetine Marker, LPN

## 2013-12-02 ENCOUNTER — Other Ambulatory Visit: Payer: Self-pay | Admitting: Family Medicine

## 2014-01-03 ENCOUNTER — Other Ambulatory Visit: Payer: Self-pay | Admitting: Family Medicine

## 2014-01-13 ENCOUNTER — Telehealth: Payer: Self-pay | Admitting: *Deleted

## 2014-01-13 MED ORDER — ESOMEPRAZOLE MAGNESIUM 40 MG PO CPDR: 40.0000 mg | DELAYED_RELEASE_CAPSULE | Freq: Two times a day (BID) | ORAL | Status: DC | PRN

## 2014-01-13 NOTE — Telephone Encounter (Signed)
This is fine.  Prescription that was originally written was not for brand so they should be able to substitute generic. I printed a new rx.

## 2014-01-13 NOTE — Telephone Encounter (Signed)
Pt called in and stated that her insurance co would not approve her for nexium  And stated that she can get this over the counter or generic substitute. She is down to 1-2 wks of medication left. If writing OTC will need to make note of this on the Rx informing them at the pharmacy.Audelia Hives [place]

## 2014-01-13 NOTE — Telephone Encounter (Signed)
Called and informed her of rx.Tara Howard, [name]

## 2014-02-10 ENCOUNTER — Encounter: Payer: Self-pay | Admitting: Family Medicine

## 2014-02-10 ENCOUNTER — Ambulatory Visit (INDEPENDENT_AMBULATORY_CARE_PROVIDER_SITE_OTHER): Payer: BC Managed Care – PPO | Admitting: Family Medicine

## 2014-02-10 VITALS — BP 111/74 | HR 85 | Temp 98.4°F | Ht 64.0 in | Wt 142.0 lb

## 2014-02-10 DIAGNOSIS — IMO0001 Reserved for inherently not codable concepts without codable children: Secondary | ICD-10-CM

## 2014-02-10 DIAGNOSIS — J45901 Unspecified asthma with (acute) exacerbation: Secondary | ICD-10-CM

## 2014-02-10 DIAGNOSIS — J4551 Severe persistent asthma with (acute) exacerbation: Secondary | ICD-10-CM

## 2014-02-10 DIAGNOSIS — J209 Acute bronchitis, unspecified: Secondary | ICD-10-CM

## 2014-02-10 MED ORDER — SULFAMETHOXAZOLE-TMP DS 800-160 MG PO TABS: 1.0000 | ORAL_TABLET | Freq: Two times a day (BID) | ORAL | Status: DC

## 2014-02-10 MED ORDER — HYDROCODONE-HOMATROPINE 5-1.5 MG/5ML PO SYRP: 5.0000 mL | ORAL_SOLUTION | Freq: Every evening | ORAL | Status: DC | PRN

## 2014-02-10 MED ORDER — TRAZODONE HCL 100 MG PO TABS: ORAL_TABLET | ORAL | Status: DC

## 2014-02-10 MED ORDER — CYCLOBENZAPRINE HCL 10 MG PO TABS: ORAL_TABLET | ORAL | Status: DC

## 2014-02-10 MED ORDER — PREDNISONE 20 MG PO TABS: 40.0000 mg | ORAL_TABLET | Freq: Every day | ORAL | Status: DC

## 2014-02-10 MED ORDER — FLUOXETINE HCL 20 MG PO CAPS: ORAL_CAPSULE | ORAL | Status: DC

## 2014-02-10 NOTE — Progress Notes (Signed)
Subjective:    Patient ID: Tara Howard, female    DOB: May 30, 1971, 43 y.o.   MRN: [id number]  HPI HA, fatigue, cough with green phlegm x 1 week.  She's had some right ear pain. Some mild sore throat. No significant nasal congestion. Her chest has felt tight that she has not noticed any wheezing. She's been using her albuterol about 3 times a day on average for about the last week. Her symptoms seem a little worse at night.  Fibromyalgia o-she would also like to followup on her fibromyalgia today. She overall feels like she is doing fairly well. Mostly she is using 1 tablet trazodone to help her sleep. Occasionally she will have to use an extra half tab. She has also been using the Flexeril as needed for muscle pain and spasm. The original prescription was one half to one whole tablet. She says most the time she has to take a whole tab to get relief. She has started a light exercise regimen and so far has been doing well up until she started feeling sick about a week ago. Review of Systems  BP 111/74  Pulse 85  Temp(Src) 98.4 F (36.9 C) (Oral)  Ht 5\' 4"  (1.626 m)  Wt 142 lb (64.411 kg)  BMI 24.36 kg/m2  SpO2 97%  PF 570 L/min    Allergies  Allergen Reactions  . Augmentin [Amoxicillin-Pot Clavulanate] Other (See Comments)    Stomach cramps, diarrhea and vomiting  . Azithromycin Other (See Comments)    Nausea and diarrhea   . Zolpidem Tartrate Other (See Comments)    Hallucinations    Past Medical History  Diagnosis Date  . Fibromyalgia   . Asthma   . Allergy     allergist in [place]  . IBS (irritable bowel syndrome)     Past Surgical History  Procedure Laterality Date  . Tubal ligation    . Endometrial ablation    . Tonsillectomy    . Cesarean section      X 2    History   Social History  . Marital Status: Married    Spouse Name: N/A    Number of Children: N/A  . Years of Education: N/A   Occupational History  . Not on file.   Social History Main Topics  .  Smoking status: Never Smoker   . Smokeless tobacco: Not on file  . Alcohol Use: No  . Drug Use:   . Sexual Activity:    Other Topics Concern  . Not on file   Social History Narrative  . No narrative on file    Family History  Problem Relation Age of Onset  . Depression Mother   . Hypertension Mother     Outpatient Encounter Prescriptions as of 02/10/2014  Medication Sig  . beclomethasone (QVAR) 40 MCG/ACT inhaler Inhale 1 puff into the lungs 2 (two) times daily.  . Beclomethasone Dipropionate (QNASL) 80 MCG/ACT AERS Place 2 sprays into both nostrils daily.  . cetirizine (ZYRTEC) 10 MG tablet Take 10 mg by mouth daily.  . cyclobenzaprine (FLEXERIL) 10 MG tablet TAKE 1/2-1 TABLET BY MOUTH AT BEDTIME AS NEEDED FOR MUSCLE SPASMS  . esomeprazole (NEXIUM) 40 MG capsule Take 1 capsule (40 mg total) by mouth 2 (two) times daily as needed. OK for generic or OTC substitution  . FLUoxetine (PROZAC) 20 MG capsule TAKE ONE CAPSULE BY MOUTH EVERY DAY  . levalbuterol (XOPENEX HFA) 45 MCG/ACT inhaler Inhale 2 puffs into the lungs every 6 (six)  hours as needed for wheezing or shortness of breath.  . traZODone (DESYREL) 100 MG tablet TAKE 1 & 1/2 TABLETS BY MOUTH AT BEDTIME  . [DISCONTINUED] cyclobenzaprine (FLEXERIL) 10 MG tablet TAKE 1/2-1 TABLET BY MOUTH AT BEDTIME AS NEEDED FOR MUSCLE SPASMS  . [DISCONTINUED] FLUoxetine (PROZAC) 20 MG capsule TAKE ONE CAPSULE BY MOUTH EVERY DAY  . [DISCONTINUED] traZODone (DESYREL) 100 MG tablet TAKE 1 & 1/2 TABLETS BY MOUTH AT BEDTIME  . HYDROcodone-homatropine (HYCODAN) 5-1.5 MG/5ML syrup Take 5 mLs by mouth at bedtime as needed for cough.  . predniSONE (DELTASONE) 20 MG tablet Take 2 tablets (40 mg total) by mouth daily.  Marland Kitchen sulfamethoxazole-trimethoprim (BACTRIM DS) 800-160 MG per tablet Take 1 tablet by mouth 2 (two) times daily.  . [DISCONTINUED] methylPREDNISolone (MEDROL DOSEPAK) 4 MG tablet follow package directions          Objective:   Physical  Exam  Constitutional: She is oriented to person, place, and time. She appears well-developed and well-nourished.  HENT:  Head: Normocephalic and atraumatic.  Right Ear: External ear normal.  Left Ear: External ear normal.  Nose: Nose normal.  Mouth/Throat: Oropharynx is clear and moist.  TMs and canals are clear.   Eyes: Conjunctivae and EOM are normal. Pupils are equal, round, and reactive to light.  Neck: Neck supple. No thyromegaly present.  Cervical lymph nodes bilaterally are mildly enlarged. Nontender.  Cardiovascular: Normal rate, regular rhythm and normal heart sounds.   Pulmonary/Chest: Effort normal and breath sounds normal. She has no wheezes.  Lymphadenopathy:    She has cervical adenopathy.  Neurological: She is alert and oriented to person, place, and time.  Skin: Skin is warm and dry.  Psychiatric: She has a normal mood and affect.          Assessment & Plan:  Acute bronchitis - explained her likely viral and that we don't typically use antibiotics. Because she does have underlying asthma that I will go ahead and give her prescription to use if she feels like she suddenly getting worse, not improving by the end of the week, or starts to run a fever. I did go ahead and send in a short course of prednisone. Also give her prescription cough medicine to use at bedtime.  Asthma exacerbation-in the green zone with peak flows. Will give a short course of prednisone. Can fill the antibiotic if needed.  Fibromyalgia-stable on current regimen. Did refill her Flexeril and trazodone today. New description sent to the pharmacy. Also refilled her fluoxetine. Followup in 4-6 months pending on how well she is doing.

## 2014-02-18 ENCOUNTER — Ambulatory Visit: Payer: BC Managed Care – PPO | Admitting: Family Medicine

## 2014-02-28 ENCOUNTER — Other Ambulatory Visit: Payer: Self-pay | Admitting: Family Medicine

## 2014-02-28 ENCOUNTER — Telehealth: Payer: Self-pay

## 2014-02-28 ENCOUNTER — Other Ambulatory Visit: Payer: Self-pay | Admitting: Physician Assistant

## 2014-02-28 MED ORDER — PREDNISONE 20 MG PO TABS
40.0000 mg | ORAL_TABLET | Freq: Every day | ORAL | Status: DC
Start: 2014-02-28 — End: 2014-06-10

## 2014-02-28 NOTE — Telephone Encounter (Signed)
Pt informed and rx faxed to [facility] and pt made an appointment to get revaluated on Monday./Roniya Tetro,CMA

## 2014-02-28 NOTE — Telephone Encounter (Signed)
Pt called and stated that she saw Dr. Madilyn Fireman on July 20 for acute bronchitis and asthma and she finished her round of prednisone and Bactrim DS ans she is not feeling any better. She stated that she is still  feeling heavy in the chest, still has a deep cough and is using her inhaler more. She would like to know if she can get another round of prednisone./Avila Albritton,CMA

## 2014-02-28 NOTE — Telephone Encounter (Signed)
Follow up appt on Monday. Will give rx of prednisone until we can see you.

## 2014-03-03 ENCOUNTER — Ambulatory Visit (INDEPENDENT_AMBULATORY_CARE_PROVIDER_SITE_OTHER): Payer: BC Managed Care – PPO | Admitting: Physician Assistant

## 2014-03-03 ENCOUNTER — Encounter: Payer: Self-pay | Admitting: Physician Assistant

## 2014-03-03 VITALS — BP 110/62 | HR 97 | Ht 64.0 in | Wt 142.0 lb

## 2014-03-03 DIAGNOSIS — J45901 Unspecified asthma with (acute) exacerbation: Secondary | ICD-10-CM | POA: Diagnosis not present

## 2014-03-03 NOTE — Progress Notes (Signed)
   Subjective:    Patient ID: Tara Howard, female    DOB: 02/16/1971, 43 y.o.   MRN: [id number]  HPI Pt presents to the clinic to follow up after being given prednisone over the weekend for cough and wheezing. She called at end of day and could not be seen. She was seen by Dr. Melissa Noon on 02/10/14 and given abx and prednisone. She just didn't get completely better. She has ongoing asthma which is normally controlled with qvar and rescue inhaler. She feels much better today. No wheezing or SOB.    Review of Systems  All other systems reviewed and are negative.      Objective:   Physical Exam  Constitutional: She is oriented to person, place, and time. She appears well-developed and well-nourished.  HENT:  Head: Normocephalic and atraumatic.  Right Ear: External ear normal.  Left Ear: External ear normal.  Nose: Nose normal.  Mouth/Throat: Oropharynx is clear and moist.  TM's clear bilaterally.   Eyes: Conjunctivae are normal.  Neck: Normal range of motion. Neck supple.  Cardiovascular: Normal rate, regular rhythm and normal heart sounds.   Pulmonary/Chest: Effort normal and breath sounds normal. She has no wheezes.  Lymphadenopathy:    She has no cervical adenopathy.  Neurological: She is alert and oriented to person, place, and time.  Skin: Skin is dry.  Psychiatric: She has a normal mood and affect. Her behavior is normal.          Assessment & Plan:  Acute asthma exacerbation- pt is much better and on 3rd day of prednisone. Finish all 5 days. Continue on qvar and rescue inhaler as needed. If having frequent exacerbations need to consider adding long acting beta agonist to regimen.

## 2014-04-29 ENCOUNTER — Other Ambulatory Visit: Payer: Self-pay | Admitting: Family Medicine

## 2014-04-29 ENCOUNTER — Telehealth: Payer: Self-pay | Admitting: *Deleted

## 2014-04-29 NOTE — Telephone Encounter (Signed)
Msg left that trazadone cannot be refilled until 05/07/14 per the script wrote in July for 120 tablets. Margette Fast, CMA

## 2014-04-30 ENCOUNTER — Other Ambulatory Visit: Payer: Self-pay | Admitting: *Deleted

## 2014-04-30 MED ORDER — TRAZODONE HCL 100 MG PO TABS: ORAL_TABLET | ORAL | Status: DC

## 2014-06-10 ENCOUNTER — Encounter: Payer: Self-pay | Admitting: Sports Medicine

## 2014-06-10 ENCOUNTER — Telehealth: Payer: Self-pay

## 2014-06-10 ENCOUNTER — Ambulatory Visit (INDEPENDENT_AMBULATORY_CARE_PROVIDER_SITE_OTHER): Payer: BC Managed Care – PPO | Admitting: Sports Medicine

## 2014-06-10 VITALS — BP 117/78 | HR 101 | Ht 64.0 in | Wt 144.0 lb

## 2014-06-10 DIAGNOSIS — M5412 Radiculopathy, cervical region: Secondary | ICD-10-CM | POA: Diagnosis not present

## 2014-06-10 MED ORDER — PREDNISONE 50 MG PO TABS: ORAL_TABLET | ORAL | Status: DC

## 2014-06-10 NOTE — Progress Notes (Signed)
   Subjective:    I'm seeing this patient as a consultation for:  Dr. Madilyn Fireman  CC: neck and arm pain  HPI: This is a pleasant 43 year old female with a history of fibromyalgia, she comes in with a long-term history of pain in her neck with radiation down both arms, right worse than left in a C5 distribution. Pain is moderate, persistent and worse when turning her head to the right. She denies any lower extremity symptoms but does have some worsening weakness and pain in both upper extremities. Moderate, persistent, no constitutional symptoms, no bowel or bladder dysfunction or saddle numbness. No trauma. She has been seeing a chiropractor where she had some x-rays but has persistent pain.  Past medical history, Surgical history, Family history not pertinant except as noted below, Social history, Allergies, and medications have been entered into the medical record, reviewed, and no changes needed.   Review of Systems: No headache, visual changes, nausea, vomiting, diarrhea, constipation, dizziness, abdominal pain, skin rash, fevers, chills, night sweats, weight loss, swollen lymph nodes, body aches, joint swelling, muscle aches, chest pain, shortness of breath, mood changes, visual or auditory hallucinations.   Objective:   General: Well Developed, well nourished, and in no acute distress.  Neuro/Psych: Alert and oriented x3, extra-ocular muscles intact, able to move all 4 extremities, sensation grossly intact. Skin: Warm and dry, no rashes noted.  Respiratory: Not using accessory muscles, speaking in full sentences, trachea midline.  Cardiovascular: Pulses palpable, no extremity edema. Abdomen: Does not appear distended. Neck: Negative spurling's Full neck range of motion Grip strength and sensation normal in bilateral hands Strength good C4 to T1 distribution No sensory change to C4 to T1 Reflexes brisk in both upper extremities to the biceps and brachioradialis. 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

## 2014-06-10 NOTE — Assessment & Plan Note (Signed)
Has already had x-rays. 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 Prednisone, cervical spine MRI urgent. Return to go over results.

## 2014-06-10 NOTE — Telephone Encounter (Signed)
PA required for MRI cervical spine without contrast. Approval # [id number].

## 2014-06-13 ENCOUNTER — Telehealth: Payer: Self-pay

## 2014-06-13 NOTE — Telephone Encounter (Signed)
Tara Howard called stated that patient has cancelled MRI. Sussie Minor,CMA

## 2014-06-14 ENCOUNTER — Ambulatory Visit (HOSPITAL_BASED_OUTPATIENT_CLINIC_OR_DEPARTMENT_OTHER): Payer: BC Managed Care – PPO

## 2014-07-08 ENCOUNTER — Ambulatory Visit (INDEPENDENT_AMBULATORY_CARE_PROVIDER_SITE_OTHER): Payer: BC Managed Care – PPO | Admitting: Physician Assistant

## 2014-07-08 ENCOUNTER — Encounter: Payer: Self-pay | Admitting: Physician Assistant

## 2014-07-08 VITALS — BP 115/75 | HR 117 | Temp 98.4°F | Ht 64.0 in | Wt 145.0 lb

## 2014-07-08 DIAGNOSIS — J029 Acute pharyngitis, unspecified: Secondary | ICD-10-CM

## 2014-07-08 LAB — POCT RAPID STREP A (OFFICE): Rapid Strep A Screen: NEGATIVE

## 2014-07-08 MED ORDER — AMOXICILLIN 875 MG PO TABS: 875.0000 mg | ORAL_TABLET | Freq: Two times a day (BID) | ORAL | Status: DC

## 2014-07-08 MED ORDER — PREDNISONE 50 MG PO TABS: ORAL_TABLET | ORAL | Status: DC

## 2014-07-08 NOTE — Progress Notes (Addendum)
   Subjective:    Patient ID: Tara Howard, female    DOB: [birth year], 43 y.o.   MRN: [id number]  HPI Pt presents to the clinic with ST for 5 days. Had URI like symptoms preceding 1 week before but that has improved. No sinus pressure, ear pain, SOB, or cough. Did run a 101 temperature this weekend. Gargling with salt water with little relief.    Review of Systems  All other systems reviewed and are negative.      Objective:   Physical Exam  Constitutional: She is oriented to person, place, and time. She appears well-developed and well-nourished.  HENT:  Head: Normocephalic and atraumatic.  Right Ear: External ear normal.  Left Ear: External ear normal.  Nose: Nose normal.  No tonsilar swelling or exudate. Erythematous bilaterally.   Eyes: Conjunctivae are normal. Right eye exhibits no discharge. Left eye exhibits no discharge.  Neck: Normal range of motion. Neck supple.  Bilateral tender and swollen adenopathy of anterior cervical lymph nodes.   Cardiovascular: Normal rate, regular rhythm and normal heart sounds.   Pulmonary/Chest: Effort normal and breath sounds normal. She has no wheezes.  Neurological: She is alert and oriented to person, place, and time.  Skin: Skin is dry.  Psychiatric: She has a normal mood and affect. Her behavior is normal.          Assessment & Plan:  Acute pharyngitis- rapid strep negative. Discussed likely viral. Start prednisone for 5 days. Continue salt water gargles. Continue throat lozengers. Consider honey with tea. If not improving or symptoms worsening printed out rx for amoxillicin confirmed only intolerant to augmentin to take for 10 days. HO given.

## 2014-07-08 NOTE — Patient Instructions (Signed)

## 2014-08-21 ENCOUNTER — Encounter: Payer: Self-pay | Admitting: Family Medicine

## 2014-08-21 ENCOUNTER — Ambulatory Visit (INDEPENDENT_AMBULATORY_CARE_PROVIDER_SITE_OTHER): Payer: BC Managed Care – PPO | Admitting: Family Medicine

## 2014-08-21 VITALS — BP 104/81 | HR 129 | Temp 98.5°F | Wt 145.0 lb

## 2014-08-21 DIAGNOSIS — J4541 Moderate persistent asthma with (acute) exacerbation: Secondary | ICD-10-CM | POA: Diagnosis not present

## 2014-08-21 MED ORDER — DOXYCYCLINE HYCLATE 100 MG PO TABS: ORAL_TABLET | ORAL | Status: AC

## 2014-08-21 MED ORDER — METHYLPREDNISOLONE (PAK) 4 MG PO TABS: ORAL_TABLET | ORAL | Status: DC

## 2014-08-21 NOTE — Progress Notes (Signed)
CC: Tara Howard is a 44 y.o. female is here for bronchitis?   Subjective: HPI:  Complains of cough and internal chest "itching" present since Friday of last week.  Began soon after playing in the snow for hours.  Persistent since then.  Moderate in severity but cough awakening her from her sleep.  Cough is productive, green non-bloody sptumn.  All improves temporarily with levalbuterol twice a day.  Continues to use qvar daily.  Describes typical asthma symptoms as cough. Mild benefit from children's cough syrup, nothing else makes better or worse.  Denies fevers, chills, wheezing, SOB, exertional chest pain, nasal congestion, nor sinus pain.   Review Of Systems Outlined In HPI  Past Medical History  Diagnosis Date  . Fibromyalgia   . Asthma   . Allergy     allergist in [place]  . IBS (irritable bowel syndrome)     Past Surgical History  Procedure Laterality Date  . Tubal ligation    . Endometrial ablation    . Tonsillectomy    . Cesarean section      X 2   Family History  Problem Relation Age of Onset  . Depression Mother   . Hypertension Mother     History   Social History  . Marital Status: Married    Spouse Name: N/A    Number of Children: N/A  . Years of Education: N/A   Occupational History  . Not on file.   Social History Main Topics  . Smoking status: Never Smoker   . Smokeless tobacco: Not on file  . Alcohol Use: No  . Drug Use: Not on file  . Sexual Activity: Not on file   Other Topics Concern  . Not on file   Social History Narrative     Objective: BP 104/81 mmHg  Pulse 129  Temp(Src) 98.5 F (36.9 C) (Oral)  Wt 145 lb (65.772 kg)  SpO2 98%  General: Alert and Oriented, No Acute Distress HEENT: Pupils equal, round, reactive to light. Conjunctivae clear.  External ears unremarkable, canals clear with intact TMs with appropriate landmarks.  Middle ear appears open without effusion. Pink inferior turbinates.  Moist mucous membranes, pharynx  without inflammation nor lesions.  Neck supple without palpable lymphadenopathy nor abnormal masses. Lungs: Clear to auscultation bilaterally, no wheezing/ronchi/rales.  Comfortable work of breathing. Good air movement. Cardiac: Regular rate and rhythm. Normal S1/S2.  No murmurs, rubs, nor gallops.   Extremities: No peripheral edema.  Strong peripheral pulses.  Mental Status: No depression, anxiety, nor agitation. Skin: Warm and dry.  Assessment & Plan: Tara Howard was seen today for bronchitis?.  Diagnoses and associated orders for this visit:  Asthma with exacerbation, moderate persistent - methylPREDNIsolone (MEDROL DOSPACK) 4 MG tablet; follow package directions - doxycycline (VIBRA-TABS) 100 MG tablet; One by mouth twice a day for ten days.    Asthma exacerbation: Begin medrol dose pack, doubt she'll need doxycycline however if not improving by Saturday she was advised to fill the doxy Rx that was printed for her today. Scheduled levalbuterol every 6 hours for next 48 hours.  Return if symptoms worsen or fail to improve.

## 2014-08-25 ENCOUNTER — Telehealth: Payer: Self-pay | Admitting: *Deleted

## 2014-08-25 ENCOUNTER — Other Ambulatory Visit: Payer: Self-pay | Admitting: Family Medicine

## 2014-08-25 MED ORDER — BENZONATATE 200 MG PO CAPS: 200.0000 mg | ORAL_CAPSULE | Freq: Two times a day (BID) | ORAL | Status: DC | PRN

## 2014-08-25 NOTE — Telephone Encounter (Signed)
Pt left a message stating she would like a cough medication called in

## 2014-08-25 NOTE — Telephone Encounter (Signed)
benzonate capsules sent to [facility]

## 2014-08-25 NOTE — Telephone Encounter (Signed)
Pt is due for a f/u specifically for this medication. Last visit referencing this med was  7/15

## 2014-08-26 NOTE — Telephone Encounter (Signed)
Pt.notified

## 2014-09-24 ENCOUNTER — Other Ambulatory Visit: Payer: Self-pay

## 2014-09-24 MED ORDER — FLUOXETINE HCL 20 MG PO CAPS: 20.0000 mg | ORAL_CAPSULE | Freq: Every day | ORAL | Status: DC

## 2014-09-24 NOTE — Telephone Encounter (Signed)
Refilling Prozac for one more month, until she can get an appointment with Dr Madilyn Fireman.

## 2014-10-09 ENCOUNTER — Ambulatory Visit (INDEPENDENT_AMBULATORY_CARE_PROVIDER_SITE_OTHER): Payer: BC Managed Care – PPO | Admitting: Family Medicine

## 2014-10-09 ENCOUNTER — Encounter: Payer: Self-pay | Admitting: Family Medicine

## 2014-10-09 VITALS — BP 103/76 | HR 79 | Wt 145.0 lb

## 2014-10-09 DIAGNOSIS — Z111 Encounter for screening for respiratory tuberculosis: Secondary | ICD-10-CM | POA: Diagnosis not present

## 2014-10-09 DIAGNOSIS — N951 Menopausal and female climacteric states: Secondary | ICD-10-CM

## 2014-10-09 DIAGNOSIS — R232 Flushing: Secondary | ICD-10-CM

## 2014-10-09 DIAGNOSIS — M797 Fibromyalgia: Secondary | ICD-10-CM

## 2014-10-09 DIAGNOSIS — Z0189 Encounter for other specified special examinations: Secondary | ICD-10-CM | POA: Diagnosis not present

## 2014-10-09 DIAGNOSIS — Z0000 Encounter for general adult medical examination without abnormal findings: Secondary | ICD-10-CM

## 2014-10-09 LAB — COMPLETE METABOLIC PANEL WITH GFR
ALBUMIN: 4.3 g/dL (ref 3.5–5.2)
ALK PHOS: 53 U/L (ref 39–117)
ALT: 13 U/L (ref 0–35)
AST: 14 U/L (ref 0–37)
BUN: 12 mg/dL (ref 6–23)
CHLORIDE: 101 meq/L (ref 96–112)
CO2: 27 mEq/L (ref 19–32)
Calcium: 9.2 mg/dL (ref 8.4–10.5)
Creat: 0.81 mg/dL (ref 0.50–1.10)
GFR, Est African American: >89 mL/min
GFR, Est Non African American: >89 mL/min
Glucose, Bld: 84 mg/dL (ref 70–99)
Potassium: 4.4 mEq/L (ref 3.5–5.3)
SODIUM: 137 meq/L (ref 135–145)
TOTAL PROTEIN: 6.4 g/dL (ref 6.0–8.3)
Total Bilirubin: 0.5 mg/dL (ref 0.2–1.2)

## 2014-10-09 LAB — CBC
HCT: 41.7 % (ref 36.0–46.0)
Hemoglobin: 13.8 g/dL (ref 12.0–15.0)
MCH: 31.2 pg (ref 26.0–34.0)
MCHC: 33.1 g/dL (ref 30.0–36.0)
MCV: 94.1 fL (ref 78.0–100.0)
MPV: 10.8 fL (ref 8.6–12.4)
Platelets: 249 10*3/uL (ref 150–400)
RBC: 4.43 MIL/uL (ref 3.87–5.11)
RDW: 13.4 % (ref 11.5–15.5)
WBC: 5.3 10*3/uL (ref 4.0–10.5)

## 2014-10-09 LAB — TSH: TSH: 1.235 u[IU]/mL (ref 0.350–4.500)

## 2014-10-09 MED ORDER — FLUOXETINE HCL 40 MG PO CAPS: 40.0000 mg | ORAL_CAPSULE | Freq: Every day | ORAL | Status: DC

## 2014-10-09 MED ORDER — TUBERCULIN PPD 5 UNIT/0.1ML ID SOLN
5.0000 [IU] | Freq: Once | INTRADERMAL | Status: DC
  Administered 2014-10-09: 5 [IU] via INTRADERMAL

## 2014-10-09 NOTE — Progress Notes (Addendum)
Subjective:    Patient ID: Tara Howard, female    DOB: 1970-11-11, 44 y.o.   MRN: [id number]  HPI  complete physical examination. She has been exercising regularly and trying to diet with a low carb diet. She says her clothes are fitting better but she is getting frustrated because her weight has sort of plateaued. She wants to know what the next step would be. She just started using the smart phone application called my fitness pal a couple of weeks ago.  She would like her estrogen check. Feels like her cor temp has dropped.  Her sistter is 65 is is going through menopause Started her period around age 87.  Has had an ablation so doesn't get period. Some hotflashes at night.   Has felt more irritable for 3-4 months. Would ike to go up on the antidepressant.  Has felt more days where she has felt more wihtdrawn.   Review of Systems Comprehensive ROS is neg.   BP 103/76 mmHg  Pulse 79  Wt 145 lb (65.772 kg)  SpO2 97%    Allergies  Allergen Reactions  . Augmentin [Amoxicillin-Pot Clavulanate] Other (See Comments)    Stomach cramps, diarrhea and vomiting  . Azithromycin Other (See Comments)    Nausea and diarrhea   . Zolpidem Tartrate Other (See Comments)    Hallucinations    Past Medical History  Diagnosis Date  . Fibromyalgia   . Asthma   . Allergy     allergist in [place]  . IBS (irritable bowel syndrome)     Past Surgical History  Procedure Laterality Date  . Tubal ligation    . Endometrial ablation    . Tonsillectomy    . Cesarean section      X 2    History   Social History  . Marital Status: Married    Spouse Name: N/A  . Number of Children: N/A  . Years of Education: N/A   Occupational History  . Not on file.   Social History Main Topics  . Smoking status: Never Smoker   . Smokeless tobacco: Not on file  . Alcohol Use: No  . Drug Use: Not on file  . Sexual Activity: Not on file   Other Topics Concern  . Not on file   Social History Narrative     Family History  Problem Relation Age of Onset  . Depression Mother   . Hypertension Mother     Outpatient Encounter Prescriptions as of 10/09/2014  Medication Sig  . beclomethasone (QVAR) 40 MCG/ACT inhaler Inhale 1 puff into the lungs 2 (two) times daily.  . Beclomethasone Dipropionate (QNASL) 80 MCG/ACT AERS Place 2 sprays into both nostrils daily.  . cetirizine (ZYRTEC) 10 MG tablet Take 10 mg by mouth daily.  . cyclobenzaprine (FLEXERIL) 10 MG tablet TAKE 1/2-1 TABLET BY MOUTH AT BEDTIME AS NEEDED FOR MUSCLE SPASMS  . FLUoxetine (PROZAC) 20 MG capsule Take 1 capsule (20 mg total) by mouth daily.  Marland Kitchen levalbuterol (XOPENEX HFA) 45 MCG/ACT inhaler Inhale 2 puffs into the lungs every 6 (six) hours as needed for wheezing or shortness of breath.  . traZODone (DESYREL) 100 MG tablet TAKE 1 & 1/2 TABLETS BY MOUTH AT BEDTIME  . [DISCONTINUED] benzonatate (TESSALON) 200 MG capsule Take 1 capsule (200 mg total) by mouth 2 (two) times daily as needed for cough.  . [DISCONTINUED] esomeprazole (NEXIUM) 40 MG capsule Take 1 capsule (40 mg total) by mouth 2 (two) times daily as needed. OK  for generic or OTC substitution (Patient taking differently: Take 40 mg by mouth daily. OK for generic or OTC substitution)  . [DISCONTINUED] methylPREDNIsolone (MEDROL DOSPACK) 4 MG tablet follow package directions          Objective:   Physical Exam  Constitutional: She is oriented to person, place, and time. She appears well-developed and well-nourished.  HENT:  Head: Normocephalic and atraumatic.  Right Ear: External ear normal.  Left Ear: External ear normal.  Nose: Nose normal.  Mouth/Throat: Oropharynx is clear and moist.  TMs and canals are clear.   Eyes: Conjunctivae and EOM are normal. Pupils are equal, round, and reactive to light.  Neck: Neck supple. No thyromegaly present.  Cardiovascular: Normal rate, regular rhythm and normal heart sounds.   Pulmonary/Chest: Effort normal and breath  sounds normal. She has no wheezes.  Abdominal: Soft. Bowel sounds are normal. She exhibits no distension and no mass. There is no tenderness. There is no rebound and no guarding.  Musculoskeletal: She exhibits no edema.  Lymphadenopathy:    She has no cervical adenopathy.  Neurological: She is alert and oriented to person, place, and time.  Skin: Skin is warm and dry.  Psychiatric: She has a normal mood and affect.          Assessment & Plan:  Keep up a regular exercise program and make sure you are eating a healthy diet Try to eat 4 servings of dairy a day, or if you are lactose intolerant take a calcium with vitamin D daily.  Your vaccines are up to date.   TB skin test - placed today. She'll follow-up in 48 hours to have it read.  Hot flash-we'll check her thyroid as well as her estradiol level.  Difficulty losing weight-recommend that she continue with the smart phone application to set calorie goals for herself. I recommend a moderately low intake of carbohydrates but not necessarily a extremely low intake of carbohydrates. I think whatever changes she is making should be lifestyle changes and not dramatic dieting changes. Continue with regular exercise and she may need to increase the amount of cardio that she's doing to be successful  Fibromyalgia-she primarily takes her fluoxetine for this but would like to have it adjusted because of some increased irritability over the last couple of months. We will increase her dose to 40 mg Prozac can call she hasn't problems or side effects. Otherwise follow back up in 6 months. Refills sent to the pharmacy.

## 2014-10-09 NOTE — Patient Instructions (Signed)
My Fitness Pal smart phone app Keep up a regular exercise program and make sure you are eating a healthy diet Try to eat 4 servings of dairy a day, or if you are lactose intolerant take a calcium with vitamin D daily.  Your vaccines are up to date.

## 2014-10-10 LAB — VARICELLA ZOSTER ANTIBODY, IGG: Varicella IgG: 1329 Index — ABNORMAL HIGH (ref <135.00)

## 2014-10-10 LAB — MEASLES/MUMPS/RUBELLA IMMUNITY
MUMPS IGG: 20.9 [AU]/ml — AB (ref <9.00)
RUBELLA: 2.85 {index} — AB (ref <0.90)

## 2014-10-10 LAB — ESTRADIOL: Estradiol: 61.1 pg/mL

## 2014-10-12 ENCOUNTER — Emergency Department
Admission: EM | Admit: 2014-10-12 | Discharge: 2014-10-12 | Disposition: A | Discharge status: Home / Self Care | Payer: Self-pay | Source: Home / Self Care

## 2014-10-12 NOTE — ED Notes (Signed)
Here for reading of PPD placed in right forearm 10-09-2014 by [place]; today's reading 67mm.

## 2014-11-05 ENCOUNTER — Other Ambulatory Visit: Payer: Self-pay | Admitting: Family Medicine

## 2014-11-06 ENCOUNTER — Other Ambulatory Visit: Payer: Self-pay | Admitting: Family Medicine

## 2014-11-10 ENCOUNTER — Other Ambulatory Visit: Payer: Self-pay | Admitting: Family Medicine

## 2014-11-13 ENCOUNTER — Other Ambulatory Visit: Payer: Self-pay | Admitting: Family Medicine

## 2014-11-24 ENCOUNTER — Telehealth: Payer: Self-pay | Admitting: Family Medicine

## 2014-11-24 NOTE — Telephone Encounter (Signed)
Qnasl is approved from 10/25/2014 - 11/24/2015. Case # [id number] - CF

## 2014-11-24 NOTE — Telephone Encounter (Signed)
Received fax from [facility] stating PA for Qnasl was about to expire redid Pa and got the medication approved - CF

## 2014-12-30 ENCOUNTER — Other Ambulatory Visit: Payer: Self-pay | Admitting: Physician Assistant

## 2015-01-05 ENCOUNTER — Ambulatory Visit (INDEPENDENT_AMBULATORY_CARE_PROVIDER_SITE_OTHER): Payer: BC Managed Care – PPO | Admitting: Family Medicine

## 2015-01-05 ENCOUNTER — Encounter: Payer: Self-pay | Admitting: Family Medicine

## 2015-01-05 VITALS — BP 110/68 | HR 102 | Ht 64.0 in | Wt 144.0 lb

## 2015-01-05 DIAGNOSIS — M5442 Lumbago with sciatica, left side: Secondary | ICD-10-CM | POA: Diagnosis not present

## 2015-01-05 DIAGNOSIS — K2100 Gastro-esophageal reflux disease with esophagitis, without bleeding: Secondary | ICD-10-CM

## 2015-01-05 DIAGNOSIS — M5441 Lumbago with sciatica, right side: Secondary | ICD-10-CM | POA: Diagnosis not present

## 2015-01-05 MED ORDER — PREDNISONE 20 MG PO TABS: 40.0000 mg | ORAL_TABLET | Freq: Every day | ORAL | Status: DC

## 2015-01-05 MED ORDER — BACLOFEN 10 MG PO TABS: 10.0000 mg | ORAL_TABLET | Freq: Two times a day (BID) | ORAL | Status: DC | PRN

## 2015-01-05 MED ORDER — MELOXICAM 7.5 MG PO TABS: 7.5000 mg | ORAL_TABLET | Freq: Two times a day (BID) | ORAL | Status: DC | PRN

## 2015-01-05 NOTE — Progress Notes (Signed)
   Subjective:    Patient ID: Tara Howard, female    DOB: 09-Feb-1971, 44 y.o.   MRN: [id number]  HPI 6 weeks of mid back pain. Started after working in the yard. Does get some relief when goes to the chiropractor.  She is leaving town for a cruise.  That will be a 14 hour car drive. Taking tylenol based products. Heating pad helps a lot .  Using Aleve some.    GERD- Take nexium OTC daily. Wants to know if this is safe. Has been taking it for years.     Review of Systems     Objective:   Physical Exam  Constitutional: She is oriented to person, place, and time. She appears well-developed and well-nourished.  HENT:  Head: Normocephalic and atraumatic.  Musculoskeletal:  Normal lumbar flexion, extension, rotation right and left is symmetric and not painful.  Normal side bendin. Neg stright leg raise bilatearlly.  Hip, knee and ankle strength is 5/5 bilatearlly.   Neurological: She is alert and oriented to person, place, and time.  Skin: Skin is warm and dry.  Psychiatric: She has a normal mood and affect. Her behavior is normal.          Assessment & Plan:  Low  back pain with radiculopahty- Recommend stetches, NSAID, heat, muscle relaxer. Hold flexeril and change to baclofen.  Call if any problems.  Ok to continue to see chiropractor.    GERD - Discussed long term efffect of PPIs. Encouraged her to wean down when she is able.

## 2015-01-06 ENCOUNTER — Telehealth: Payer: Self-pay | Admitting: *Deleted

## 2015-01-06 MED ORDER — TRAMADOL HCL 50 MG PO TABS: 50.0000 mg | ORAL_TABLET | Freq: Three times a day (TID) | ORAL | Status: DC | PRN

## 2015-01-06 NOTE — Telephone Encounter (Signed)
Patient advised and prescription faxed.

## 2015-01-06 NOTE — Telephone Encounter (Signed)
See if she started the prednisone. If so then we can try tramadol. i will fax over a new rx.  Still need to take the mobic bc has anti-inflammatory properties.

## 2015-01-06 NOTE — Telephone Encounter (Signed)
Called pt back and lvm informing her that I will route this to Dr. Madilyn Fireman.Tara Howard [place]

## 2015-01-06 NOTE — Telephone Encounter (Signed)
Pt called and lvm indicating that she took a mobic last night for the back pain and it didn't help. She stated that when she got up this morning her back was still hurting really bad and she took 2 tablets and this did not help with her pain and wanted to know if there was there anything stronger that she could have. She

## 2015-02-04 ENCOUNTER — Ambulatory Visit (INDEPENDENT_AMBULATORY_CARE_PROVIDER_SITE_OTHER): Payer: BC Managed Care – PPO | Admitting: Family Medicine

## 2015-02-04 VITALS — BP 112/73 | HR 91 | Wt 143.0 lb

## 2015-02-04 DIAGNOSIS — K5901 Slow transit constipation: Secondary | ICD-10-CM

## 2015-02-04 DIAGNOSIS — K5900 Constipation, unspecified: Secondary | ICD-10-CM | POA: Insufficient documentation

## 2015-02-04 MED ORDER — POLYETHYLENE GLYCOL 3350 17 GM/SCOOP PO POWD: 17.0000 g | Freq: Two times a day (BID) | ORAL | Status: DC

## 2015-02-04 NOTE — Progress Notes (Signed)
Tara Howard is a 44 y.o. female who presents to [facility]  today for Constipation. Patient has a one-month history of worseningconstipation. She has a history of IBS and has had constipation issues in the past however has been worse recently. She notes she has to use senna frequently for constipation which does help some. She has cramping bloating and gas and pain. She denies any fevers chills or vomiting or diarrhea. She does note cramping nausea and pain. She has stopped taking MiraLAX in the past. She notes her symptoms worsened after she developed back pain and was given tramadol and baclofen. She has discontinued tramadol and uses baclofen at night.patient would like referral back to gastroenterology if possible.   Past Medical History  Diagnosis Date  . Fibromyalgia   . Asthma   . Allergy     allergist in [place]  . IBS (irritable bowel syndrome)    Past Surgical History  Procedure Laterality Date  . Tubal ligation    . Endometrial ablation    . Tonsillectomy    . Cesarean section      X 2   History  Substance Use Topics  . Smoking status: Never Smoker   . Smokeless tobacco: Not on file  . Alcohol Use: No   ROS as above Medications: Current Outpatient Prescriptions  Medication Sig Dispense Refill  . baclofen (LIORESAL) 10 MG tablet Take 1 tablet (10 mg total) by mouth 2 (two) times daily as needed for muscle spasms. 40 each 0  . cetirizine (ZYRTEC) 10 MG tablet Take 10 mg by mouth daily.    . cyclobenzaprine (FLEXERIL) 10 MG tablet TAKE 1/2-1 TABLET BY MOUTH AT BEDTIME AS NEEDED FOR MUSCLE SPASMS 30 tablet 5  . FLUoxetine (PROZAC) 40 MG capsule Take 1 capsule (40 mg total) by mouth daily. 90 capsule 1  . meloxicam (MOBIC) 7.5 MG tablet Take 1 tablet (7.5 mg total) by mouth 2 (two) times daily as needed for pain. 60 tablet 0  . QNASL 80 MCG/ACT AERS PLACE 2 SPRAYS IN BOTH NOSTRILS DAILY 8.7 g 3  . QVAR 40 MCG/ACT inhaler INHALE 1 PUFF INTO  THE LUNGS 2 TIMES A DAY 8.7 g 4  . traZODone (DESYREL) 100 MG tablet TAKE 1 & 1/2 TABLETS BY MOUTH AT BEDTIME 45 tablet 5  . XOPENEX HFA 45 MCG/ACT inhaler INHALE 2 PUFFS INTO THE LUNGS EVERY 6 HOURS AS NEEDED FOR WHEEZING/SHORTNESS OF BREATH 15 g 2  . polyethylene glycol powder (GLYCOLAX/MIRALAX) powder Take 17 g by mouth 2 (two) times daily. 850 g 12   No current facility-administered medications for this visit.   Allergies  Allergen Reactions  . Augmentin [Amoxicillin-Pot Clavulanate] Other (See Comments)    Stomach cramps, diarrhea and vomiting  . Azithromycin Other (See Comments)    Nausea and diarrhea   . Zolpidem Tartrate Other (See Comments)    Hallucinations     Exam:  BP 112/73 mmHg  Pulse 91  Wt 143 lb (64.864 kg) Gen: Well NAD HEENT: EOMI,  MMM Lungs: Normal work of breathing. CTABL Heart: RRR no MRG Abd: NABS, Soft. Nondistended, Nontenderno no masses palpated. Exts: Brisk capillary refill, warm and well perfused.   No results found for this or any previous visit (from the past 24 hour(s)). No results found.   Please see individual assessment and plan sections. This visit required moderate complexity and decision making.

## 2015-02-04 NOTE — Assessment & Plan Note (Signed)
Likely related to combination of IBS and baclofen. Treat with increased dose of MiraLAX 2-3 times dailyas well as Senokot. Refer to gastroenterology. Return as needed.

## 2015-02-04 NOTE — Patient Instructions (Signed)
Thank you for coming in today. Try stopping baclofen. Take MiraLAX at least twice daily maybe 3 times daily to achieve desired results. Use Senokot as needed but sparingly. If not better please follow-up with gastroenterology.   We will contact you with referral information. If your belly pain worsens, or you have high fever, bad vomiting, blood in your stool or black tarry stool go to the Emergency Room.

## 2015-04-29 ENCOUNTER — Other Ambulatory Visit: Payer: Self-pay | Admitting: Family Medicine

## 2015-05-26 ENCOUNTER — Encounter: Payer: Self-pay | Admitting: Family Medicine

## 2015-05-26 ENCOUNTER — Ambulatory Visit (INDEPENDENT_AMBULATORY_CARE_PROVIDER_SITE_OTHER): Payer: BC Managed Care – PPO | Admitting: Family Medicine

## 2015-05-26 VITALS — BP 104/66 | HR 91 | Temp 98.7°F | Resp 16 | Wt 143.1 lb

## 2015-05-26 DIAGNOSIS — K2100 Gastro-esophageal reflux disease with esophagitis, without bleeding: Secondary | ICD-10-CM

## 2015-05-26 DIAGNOSIS — J4530 Mild persistent asthma, uncomplicated: Secondary | ICD-10-CM | POA: Diagnosis not present

## 2015-05-26 DIAGNOSIS — M797 Fibromyalgia: Secondary | ICD-10-CM

## 2015-05-26 MED ORDER — FLUOXETINE HCL 40 MG PO CAPS: 40.0000 mg | ORAL_CAPSULE | Freq: Every day | ORAL | Status: DC

## 2015-05-26 MED ORDER — RANITIDINE HCL 150 MG PO CAPS: 150.0000 mg | ORAL_CAPSULE | Freq: Two times a day (BID) | ORAL | Status: DC

## 2015-05-26 MED ORDER — GABAPENTIN 100 MG PO CAPS: ORAL_CAPSULE | ORAL | Status: DC

## 2015-05-26 NOTE — Progress Notes (Signed)
   Subjective:    Patient ID: Tara Howard, female    DOB: 1970/08/28, 44 y.o.   MRN: [id number]  HPI  F/U ASthma - she is using her Qvar daily and rarely using her albuterol. Doing well this fall.  No recent exacerbations.   GERD - she has been trying to get off her Nexium.  She stopped it 3 days ago.  She started and hebal digestive enzymes.  Stopping her medication she's had a lot of nausea and epigastric discomfort.  F/U fibromyalgia - we increased her dose on her fluoxetine about 7 months ago for some increased irritability.  She does feel like the irritability has improved on the increased dose. She is still struggling with increasing pain in fact the pain has been waking her up at night. She says it's mostly all over. Asked if she had any areas that were specifically worse than others but she said it was all over her body.. She also brought a magnesium supplement to take as well. But was told that her Nexium may be impairing absorption of some of the moderate vitamins and minerals.  Review of Systems     Objective:   Physical Exam  Constitutional: She is oriented to person, place, and time. She appears well-developed and well-nourished.  HENT:  Head: Normocephalic and atraumatic.  Cardiovascular: Normal rate, regular rhythm and normal heart sounds.   Pulmonary/Chest: Effort normal and breath sounds normal.  Neurological: She is alert and oriented to person, place, and time.  Skin: Skin is warm and dry.  Psychiatric: She has a normal mood and affect. Her behavior is normal.          Assessment & Plan:  Asthma-well-controlled. Continue current regimen. If she starts to use her albuterol more than twice per week then please give [place] a call. Next  GERD-discussed several options. We can either wean down the Nexium. She can take it every other day for about a month and then go to every third day. Or can try switching to an H2 blocker to step down her therapy. She opted to switch  to ranitidine. New prescription sent. We'll start with twice a day dosing and if doing well can try decreasing down to once a day if tolerated well. Next  Fibromyalgia-we discussed several options. We could consider Cymbalta that she is Re: On fluoxetine and would have to discontinue that. She actually think she may have tried that before in the past. She does try to get exercise regularly. Her sleep quality has been disrupted but mostly secondary to pain. We discussed either a trial of gabapentin or Lyrica. It looks like her insurance would not cover Lyrica. We discussed potential side effects of medication including sedation etc. I'll see her back in one month and we can adjust her dose at that time.

## 2015-06-05 ENCOUNTER — Other Ambulatory Visit: Payer: Self-pay | Admitting: Family Medicine

## 2015-07-13 ENCOUNTER — Ambulatory Visit (INDEPENDENT_AMBULATORY_CARE_PROVIDER_SITE_OTHER): Payer: BC Managed Care – PPO | Admitting: Physician Assistant

## 2015-07-13 ENCOUNTER — Encounter: Payer: Self-pay | Admitting: Physician Assistant

## 2015-07-13 VITALS — BP 110/69 | HR 95 | Temp 98.5°F | Ht 64.0 in | Wt 136.0 lb

## 2015-07-13 DIAGNOSIS — J208 Acute bronchitis due to other specified organisms: Secondary | ICD-10-CM | POA: Diagnosis not present

## 2015-07-13 MED ORDER — DOXYCYCLINE HYCLATE 100 MG PO TABS: 100.0000 mg | ORAL_TABLET | Freq: Two times a day (BID) | ORAL | Status: DC

## 2015-07-13 MED ORDER — HYDROCOD POLST-CPM POLST ER 10-8 MG/5ML PO SUER: 5.0000 mL | Freq: Two times a day (BID) | ORAL | Status: DC | PRN

## 2015-07-13 MED ORDER — GABAPENTIN 300 MG PO CAPS: ORAL_CAPSULE | ORAL | Status: DC

## 2015-07-13 MED ORDER — PREDNISONE 20 MG PO TABS: ORAL_TABLET | ORAL | Status: DC

## 2015-07-13 NOTE — Progress Notes (Signed)
   Subjective:    Patient ID: Tara Howard, female    DOB: 21-Dec-1970, 44 y.o.   MRN: [id number]  HPI   patient is a 44 year old female who presents to the clinic with 1  And one half weeks of cough and congestion. Patient has a history of asthma. She is using her rescue inhaler at least twice a day.  Her cough is very productive. She does feel like her chest is tight and she reports some wheezing. She feels like Mucinex made her feel weird.  She is leaving for [place] on Thursday. She denies any fever, chills, body ache, nausea or vomiting.  Both of her kids are sick with upper respiratory infections.    Review of Systems  All other systems reviewed and are negative.      Objective:   Physical Exam  Constitutional: She is oriented to person, place, and time. She appears well-developed and well-nourished.  HENT:  Head: Normocephalic and atraumatic.  Nose: Nose normal.  Mouth/Throat: Oropharynx is clear and moist. No oropharyngeal exudate.  TMs appear to have clear fluid behind them. Slightly erythematous. No blood or pus present. Positive light reflexes bilaterally.  No maxillary or frontal sinus tenderness to palpation.  Eyes: Conjunctivae are normal. Right eye exhibits no discharge. Left eye exhibits no discharge.  Neck: Normal range of motion. Neck supple.  Cardiovascular: Normal rate, regular rhythm and normal heart sounds.   Pulmonary/Chest: Effort normal. She has no wheezes.  Lymphadenopathy:    She has no cervical adenopathy.  Neurological: She is alert and oriented to person, place, and time.  Psychiatric: She has a normal mood and affect. Her behavior is normal.          Assessment & Plan:  Acute bronchitis- pt has rescue inhaler to continue. Doxycycline (due to PCN and Zpak allergy), prednisone and tussinonex given today. Follow up as needed.   Pt needed refill on gabapentin for fibromyalgia. She states 300mg  working great. Gave 90 tablets and to follow up with  PCP.

## 2015-08-17 ENCOUNTER — Other Ambulatory Visit: Payer: Self-pay | Admitting: Physician Assistant

## 2015-09-14 ENCOUNTER — Ambulatory Visit (INDEPENDENT_AMBULATORY_CARE_PROVIDER_SITE_OTHER): Payer: BC Managed Care – PPO | Admitting: Family Medicine

## 2015-09-14 ENCOUNTER — Encounter: Payer: Self-pay | Admitting: Family Medicine

## 2015-09-14 VITALS — BP 104/67 | HR 52 | Wt 137.0 lb

## 2015-09-14 DIAGNOSIS — J4520 Mild intermittent asthma, uncomplicated: Secondary | ICD-10-CM | POA: Diagnosis not present

## 2015-09-14 DIAGNOSIS — M25561 Pain in right knee: Secondary | ICD-10-CM | POA: Diagnosis not present

## 2015-09-14 DIAGNOSIS — M797 Fibromyalgia: Secondary | ICD-10-CM

## 2015-09-14 MED ORDER — FLUTICASONE PROPIONATE HFA 220 MCG/ACT IN AERO: 2.0000 | INHALATION_SPRAY | Freq: Two times a day (BID) | RESPIRATORY_TRACT | Status: DC

## 2015-09-14 MED ORDER — GABAPENTIN 100 MG PO CAPS: ORAL_CAPSULE | ORAL | Status: DC

## 2015-09-14 NOTE — Patient Instructions (Signed)
One in AM x 5 days, then one in AM and at noon x 5 days. Then Ok to increase to 2 in AM, one at noon.  Then after one week Ok to increase to 2 in AM and 2 at noon.  Continue the 300mg  at bedtime.

## 2015-09-14 NOTE — Progress Notes (Signed)
   Subjective:    Patient ID: Tara Howard, female    DOB: Dec 10, 1970, 45 y.o.   MRN: [id number]  HPI  bilat knee pain since college injury. Saw ortho years ago and was told getting OA in her knee. The cartilage was rubbing off on the back of her knee. No prior surgeries.  Says usually responds well to a cortisone injection.  Right now it's primarily her right knee that's bothering her.   Follow-up asthma-unfortunately her insurance would not cover Qvar this calendar year. She's not sure what alternatives are covered.  Fibromyalgia- she feel like the gabapentin does help at night but not helping during the daytime.  She wants to know if any alternatives.    Review of Systems     Objective:   Physical Exam  Constitutional: She is oriented to person, place, and time. She appears well-developed and well-nourished.  HENT:  Head: Normocephalic and atraumatic.  Eyes: Conjunctivae and EOM are normal.  Cardiovascular: Normal rate.   Pulmonary/Chest: Effort normal.  Musculoskeletal:  Right knee with normal flexion extension. No crepitus. No increased laxity with anterior posterior drawer. No significant discomfort or pain with valgus or varus stress. Negative McMurray's. She does have some mild tenderness along the lateral joint line.  Neurological: She is alert and oriented to person, place, and time.  Skin: Skin is dry. No pallor.  Psychiatric: She has a normal mood and affect. Her behavior is normal.  Vitals reviewed.         Assessment & Plan:  Right knee OA - her exam is actually fairly normal today except for a little bit of tenderness over the lateral joint line. Injection performed. Patient tolerated it well. Recommended she ice it when she gets home and call if any new symptoms or not improving.  Asthma-we'll try Flovent.  New Rx sent.   Fibromyalgia- discussed options of inc gabapentin but monitoring for sedation. Also dicussed could consider Lyrica as well but may be more  costly.  F/u in 6 weeks.   Knee Injection Procedure Note  Pre-operative Diagnosis: right   Post-operative Diagnosis: same  Indications: pain relief   Anesthesia: Lidocaine 1% without epinephrine without added sodium bicarbonate  Procedure Details   Verbal consent was obtained for the procedure. The joint was prepped with Betadine and a small wheel of anesthetic was injected into the subcutaneous tissue. A 22 gauge needle was inserted into the superior aspect of the joint from a lateral approach.  9 ml 1% lidocaine and 1 ml of triamcinolone (KENALOG) 40mg /ml was then injected into the joint through the same needle. The needle was removed and the area cleansed and dressed.  Complications:  None; patient tolerated the procedure well.

## 2015-09-22 ENCOUNTER — Other Ambulatory Visit: Payer: Self-pay | Admitting: *Deleted

## 2015-09-22 ENCOUNTER — Other Ambulatory Visit: Payer: Self-pay | Admitting: Family Medicine

## 2015-09-22 MED ORDER — GABAPENTIN 100 MG PO CAPS: 300.0000 mg | ORAL_CAPSULE | Freq: Every day | ORAL | Status: DC

## 2015-10-05 ENCOUNTER — Telehealth: Payer: Self-pay | Admitting: Family Medicine

## 2015-10-05 NOTE — Telephone Encounter (Signed)
Call pt: we can change her Qnal to fluticasone ( Flonase) and her Xopenex to [employer]. If she is ok with both of these then let me know.

## 2015-10-05 NOTE — Telephone Encounter (Signed)
Pt called office to state insurance would no longer cover Qnasl or Xopenex. Pt has a list of preferred drugs, she will bring this by office for PCP to change.

## 2015-10-06 MED ORDER — FLUTICASONE PROPIONATE 50 MCG/ACT NA SUSP: 1.0000 | Freq: Every day | NASAL | Status: DC

## 2015-10-06 MED ORDER — ALBUTEROL SULFATE 108 (90 BASE) MCG/ACT IN AEPB: 1.0000 | INHALATION_SPRAY | Freq: Four times a day (QID) | RESPIRATORY_TRACT | Status: DC

## 2015-10-06 NOTE — Telephone Encounter (Signed)
Scripts sent

## 2015-10-06 NOTE — Telephone Encounter (Signed)
Pt is OK to try both of the alternative medications. Pt does report that higher doses of albuterol makes her heart race, one concern she has regarding the Proair.

## 2015-10-09 ENCOUNTER — Telehealth: Payer: Self-pay | Admitting: *Deleted

## 2015-10-09 MED ORDER — BECLOMETHASONE DIPROPIONATE 80 MCG/ACT NA AERS: 2.0000 | INHALATION_SPRAY | Freq: Every day | NASAL | Status: DC

## 2015-10-09 NOTE — Telephone Encounter (Signed)
Pt called and lvm stating that the flonase is not working effectively and would like to get prescription for Qnasal she stated that this will likely require a PA thru her insurance. rx sent.Tara Howard [place]

## 2015-10-12 ENCOUNTER — Ambulatory Visit: Payer: BC Managed Care – PPO | Admitting: Family Medicine

## 2015-10-15 ENCOUNTER — Encounter: Payer: Self-pay | Admitting: Family Medicine

## 2015-10-15 ENCOUNTER — Ambulatory Visit (INDEPENDENT_AMBULATORY_CARE_PROVIDER_SITE_OTHER): Payer: BC Managed Care – PPO | Admitting: Family Medicine

## 2015-10-15 VITALS — BP 117/64 | HR 95 | Wt 135.0 lb

## 2015-10-15 DIAGNOSIS — J4540 Moderate persistent asthma, uncomplicated: Secondary | ICD-10-CM | POA: Diagnosis not present

## 2015-10-15 DIAGNOSIS — J0100 Acute maxillary sinusitis, unspecified: Secondary | ICD-10-CM | POA: Diagnosis not present

## 2015-10-15 DIAGNOSIS — M797 Fibromyalgia: Secondary | ICD-10-CM | POA: Diagnosis not present

## 2015-10-15 MED ORDER — BECLOMETHASONE DIPROPIONATE 80 MCG/ACT IN AERS: 2.0000 | INHALATION_SPRAY | Freq: Two times a day (BID) | RESPIRATORY_TRACT | Status: DC

## 2015-10-15 MED ORDER — GABAPENTIN 300 MG PO CAPS: 300.0000 mg | ORAL_CAPSULE | Freq: Every day | ORAL | Status: DC

## 2015-10-15 MED ORDER — CEFDINIR 300 MG PO CAPS: 300.0000 mg | ORAL_CAPSULE | Freq: Two times a day (BID) | ORAL | Status: DC

## 2015-10-15 MED ORDER — LEVALBUTEROL TARTRATE 45 MCG/ACT IN AERO: 2.0000 | INHALATION_SPRAY | Freq: Four times a day (QID) | RESPIRATORY_TRACT | Status: DC | PRN

## 2015-10-15 MED ORDER — GABAPENTIN 100 MG PO CAPS: 100.0000 mg | ORAL_CAPSULE | Freq: Two times a day (BID) | ORAL | Status: DC

## 2015-10-15 NOTE — Progress Notes (Signed)
   Subjective:    Patient ID: Tara Howard, female    DOB: December 27, 1970, 45 y.o.   MRN: [id number]  HPI Low up fibromyalgia-we decided to put her on gabapentin about a month ago. We discussed Lyrica as well but it is more costly. She is now taking gabapentin 100 mg in the morning, 100 mg in the afternoon, and 300 mg at bedtime. She says that her overall myofascial pain has been improved by about 50%. She's feeling a little bit of sedation but says she steadily willing to continue taking the medication.  She also complains of feeling sick since Sunday, approximately 5 days. She's had significant nasal congestion and cough. Taking Sudafed. And allergy medication. No fevers chills or sweats. She says this is the worst sinus pressure that she's ever had with a sinus infection. It's mostly bilateral across her face. Her teeth are even hurting. She is not had any fevers. She said initially it started with a sore throat but that's actually better. She says yesterday she felt so bad she didn't even get out of bed.  Asthma - says the flovent "made her feel crazy".  She wants to go back to the Qvar. He also needs a refill on her Xopenex.  Review of Systems     Objective:   Physical Exam  Constitutional: She is oriented to person, place, and time. She appears well-developed and well-nourished.  HENT:  Head: Normocephalic and atraumatic.  Right Ear: External ear normal.  Left Ear: External ear normal.  Nose: Nose normal.  Mouth/Throat: Oropharynx is clear and moist.  TMs and canals are clear.   Eyes: Conjunctivae and EOM are normal. Pupils are equal, round, and reactive to light.  Neck: Neck supple. No thyromegaly present.  Cardiovascular: Normal rate, regular rhythm and normal heart sounds.   Pulmonary/Chest: Effort normal and breath sounds normal. She has no wheezes.  Lymphadenopathy:    She has no cervical adenopathy.  Neurological: She is alert and oriented to person, place, and time.  Skin:  Skin is warm and dry.  Psychiatric: She has a normal mood and affect.          Assessment & Plan:  Acute sinusitis - Will treat with cefdinir 300 mg twice a day for 10 days. Call if not significantly improved after 4-5 days.  Fibromyalgia-she's doing much better with a 50% improvement in symptoms on the gabapentin. Will continue current dose. Like to see her back in about 6 weeks and if she is doing well at that time we can see if we need to adjust her regimen. Next  Asthma-she has not felt well on the Flovent. We'll try to get a prior authorization for Qvar. We'll resend a pharmacy that we can get prior authorization to contact and move forward.

## 2015-10-16 ENCOUNTER — Encounter: Payer: Self-pay | Admitting: Family Medicine

## 2015-11-02 ENCOUNTER — Other Ambulatory Visit: Payer: Self-pay | Admitting: Family Medicine

## 2015-11-18 ENCOUNTER — Other Ambulatory Visit: Payer: Self-pay | Admitting: Family Medicine

## 2015-11-26 ENCOUNTER — Encounter: Payer: Self-pay | Admitting: Family Medicine

## 2015-11-26 ENCOUNTER — Telehealth: Payer: Self-pay | Admitting: *Deleted

## 2015-11-26 ENCOUNTER — Ambulatory Visit (INDEPENDENT_AMBULATORY_CARE_PROVIDER_SITE_OTHER): Payer: BC Managed Care – PPO | Admitting: Family Medicine

## 2015-11-26 VITALS — BP 105/65 | HR 89 | Wt 134.0 lb

## 2015-11-26 DIAGNOSIS — Z1231 Encounter for screening mammogram for malignant neoplasm of breast: Secondary | ICD-10-CM

## 2015-11-26 DIAGNOSIS — M797 Fibromyalgia: Secondary | ICD-10-CM

## 2015-11-26 DIAGNOSIS — J4540 Moderate persistent asthma, uncomplicated: Secondary | ICD-10-CM | POA: Diagnosis not present

## 2015-11-26 MED ORDER — GABAPENTIN 100 MG PO CAPS: ORAL_CAPSULE | ORAL | Status: DC

## 2015-11-26 NOTE — Telephone Encounter (Signed)
Qvar does not need a prior auth. Copay is $43. Pt notifed. Received PA for Qnasl but didn't see this on current med list so wanted to make sure this was not an error. Called patient and let her know about Qvar and she did confirm that she still using this medication.  PA initiated for Qnasl  Information regarding your request:  PA is not needed for the patient/medication combination in the request.   Notified pharm of outcome

## 2015-11-26 NOTE — Progress Notes (Signed)
   Subjective:    Patient ID: Tara Howard, female    DOB: 1971-07-08, 45 y.o.   MRN: [id number]  HPI Here for follow-up fibromyalgia, 6 week follow-up. We previously decided to put her on gabapentin which she has now been on for almost 2 months. Overall her myofascial pain was reduced by about 50%. She did have a little bit of sedation but says she was willing to continue the medication. She follows with her chiropractor every 2 weeks. She saw a different chiropractor there last week and was a little bit more sore than usual and so does feel like she had a little bit of a flare last week with her fibroid. She has noted she's had a little bit more frequent headaches recently. She is doing well on her gabapentin. She is taking 2 in the morning, 2 at noon and 3 at bedtime. Sleep overall is good but she still wakes up every night around 2 AM and then falls back asleep after about an hour. She still takes one half tabs of trazodone at bedtime to help with sleep. And occasionally she will use her meloxicam for pain. When she does take it it actually seems to improve her sleep at night as well. She does walk some and she has a new dog. She tries to do stretches when she gets more sore and occasionally tries to do yoga.  Asthma-I last saw her she did not feel well on the Flovent so we had tried switching her back to Qvar which she had used in the past. She has not picked it up she was waiting to hear back to see if it got approved by her insurance.   Review of Systems     Objective:   Physical Exam  Constitutional: She is oriented to person, place, and time. She appears well-developed and well-nourished.  HENT:  Head: Normocephalic and atraumatic.  Neck: Neck supple. No thyromegaly present.  Cardiovascular: Normal rate, regular rhythm and normal heart sounds.   Pulmonary/Chest: Effort normal and breath sounds normal.  Musculoskeletal:  Nontender over the upper extremities today.  Lymphadenopathy:     She has no cervical adenopathy.  Neurological: She is alert and oriented to person, place, and time.  Skin: Skin is warm and dry.  Psychiatric: She has a normal mood and affect. Her behavior is normal.          Assessment & Plan:  Fibromyalgia  -continue current regimen. Follow-up in 3 months. Continue with current regimen. Continue with chiropractic care. Continue regular walking and stretching and trying to be ago when she can.  Asthma-we called the pharmacy and the Qvar does require prior authorization. Unfortunately they did not notify us so we did not initiate the PA. Will try to get that done as soon as possible.  She is overdue for Pap smear and mammogram would like to transfer her care from her OB/GYN at [facility] to hear. Encouraged her to make an appointment may be in June and July for a well woman exam with Pap smear. We'll go ahead and place order for mammogram as her last one was in February 2013.

## 2015-11-27 ENCOUNTER — Other Ambulatory Visit: Payer: Self-pay | Admitting: Family Medicine

## 2015-12-04 ENCOUNTER — Other Ambulatory Visit: Payer: Self-pay | Admitting: Family Medicine

## 2015-12-24 ENCOUNTER — Other Ambulatory Visit: Payer: Self-pay | Admitting: Family Medicine

## 2016-01-14 ENCOUNTER — Telehealth: Payer: Self-pay | Admitting: *Deleted

## 2016-01-14 NOTE — Telephone Encounter (Signed)
PA submitted through covermymed for Qnasl  Key: [place] - PA Case ID: [id number]

## 2016-01-20 ENCOUNTER — Other Ambulatory Visit: Payer: Self-pay | Admitting: Family Medicine

## 2016-01-20 ENCOUNTER — Telehealth: Payer: Self-pay | Admitting: Family Medicine

## 2016-01-20 NOTE — Telephone Encounter (Signed)
That is fine. I will be happy to see her and freeze her warts.

## 2016-01-20 NOTE — Telephone Encounter (Signed)
Pt called to ask a question regarding her daughter Minette Brine [place]: 06/04/2004). Pt states Minette Brine has a plantars wart on her foot and her pediatrician does not freeze them off. Minette Brine has never been seen in our office before but mother questions if Dr. Madilyn Fireman would be willing to freeze off the wart since mother is established here. Advised Pt I was unsure since Minette Brine is not established here but would route to PCP for review then call her back.

## 2016-01-20 NOTE — Telephone Encounter (Signed)
Mother advised and transferred to scheduling for appt.

## 2016-02-19 ENCOUNTER — Other Ambulatory Visit: Payer: Self-pay | Admitting: Family Medicine

## 2016-02-23 ENCOUNTER — Other Ambulatory Visit: Payer: Self-pay

## 2016-02-23 ENCOUNTER — Ambulatory Visit: Payer: BC Managed Care – PPO | Admitting: Osteopathic Medicine

## 2016-02-23 MED ORDER — FLUOXETINE HCL 40 MG PO CAPS: ORAL_CAPSULE | ORAL | 0 refills | Status: DC

## 2016-02-23 NOTE — Telephone Encounter (Signed)
I was told by Dr. Sheppard Coil to call the patient and advise her that she would refill her Prozac 40 mg for 30 days only but she will need to follow up Dr. Suzi Roots before her Rx runs out. Patient was transferred to scheduling to schedule an appointment for March 25, 2016 and she verbally understood that she needs to keep this appointment for further refills. Samanthia Howland,CMA

## 2016-02-25 NOTE — Telephone Encounter (Signed)
Approvedon June 22  PA has been Approved

## 2016-03-14 ENCOUNTER — Other Ambulatory Visit: Payer: Self-pay | Admitting: Family Medicine

## 2016-03-23 ENCOUNTER — Other Ambulatory Visit: Payer: Self-pay | Admitting: Family Medicine

## 2016-03-24 ENCOUNTER — Other Ambulatory Visit: Payer: Self-pay | Admitting: *Deleted

## 2016-03-24 MED ORDER — BECLOMETHASONE DIPROPIONATE 40 MCG/ACT IN AERS: 2.0000 | INHALATION_SPRAY | Freq: Two times a day (BID) | RESPIRATORY_TRACT | 3 refills | Status: DC

## 2016-03-25 ENCOUNTER — Encounter: Payer: Self-pay | Admitting: Family Medicine

## 2016-03-25 ENCOUNTER — Ambulatory Visit (INDEPENDENT_AMBULATORY_CARE_PROVIDER_SITE_OTHER): Payer: BC Managed Care – PPO | Admitting: Family Medicine

## 2016-03-25 VITALS — BP 112/64 | HR 77 | Ht 64.0 in | Wt 142.0 lb

## 2016-03-25 DIAGNOSIS — Z13220 Encounter for screening for lipoid disorders: Secondary | ICD-10-CM

## 2016-03-25 DIAGNOSIS — G4700 Insomnia, unspecified: Secondary | ICD-10-CM

## 2016-03-25 DIAGNOSIS — M797 Fibromyalgia: Secondary | ICD-10-CM

## 2016-03-25 DIAGNOSIS — J4530 Mild persistent asthma, uncomplicated: Secondary | ICD-10-CM | POA: Diagnosis not present

## 2016-03-25 DIAGNOSIS — Z23 Encounter for immunization: Secondary | ICD-10-CM

## 2016-03-25 MED ORDER — FLUOXETINE HCL 40 MG PO CAPS: ORAL_CAPSULE | ORAL | 3 refills | Status: DC

## 2016-03-25 MED ORDER — GABAPENTIN 300 MG PO CAPS: 300.0000 mg | ORAL_CAPSULE | Freq: Every day | ORAL | 1 refills | Status: DC

## 2016-03-25 MED ORDER — GABAPENTIN 100 MG PO CAPS: ORAL_CAPSULE | ORAL | 5 refills | Status: DC

## 2016-03-25 MED ORDER — MELOXICAM 15 MG PO TABS: ORAL_TABLET | ORAL | 3 refills | Status: DC

## 2016-03-25 NOTE — Progress Notes (Signed)
Subjective:    CC: Fibro, Asthma  HPI:  Fibromyalgia - here for follow-up today. She was started on gabapentin back in March of this year. She's now been on it for about 6 months.She started having a flare last week when she started back to work. 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 She was just up and on her feet a lot and has been very sore. Plus she went to [place] couple of weeks ago and was jumping on trampoline senses actually hurt her back. She did go to the chiropractor on Monday and says that has helped as well.  F/U Asthma - she is on QVAR daily.  Says only uses 1 puff once a day and says it is doing well.  Uses Xopenex PRN.  Using it less than 2 x per week.  She reports that she's noticed a few more symptoms recently.  Insomnia - she is still using her trazodone nightly.    Past medical history, Surgical history, Family history not pertinant except as noted below, Social history, Allergies, and medications have been entered into the medical record, reviewed, and corrections made.   Review of Systems: No fevers, chills, night sweats, weight loss, chest pain, or shortness of breath.   Objective:    General: Well Developed, well nourished, and in no acute distress.  Neuro: Alert and oriented x3, extra-ocular muscles intact, sensation grossly intact.  HEENT: Normocephalic, atraumatic  Skin: Warm and dry, no rashes. Cardiac: Regular rate and rhythm, no murmurs rubs or gallops, no lower extremity edema.  Respiratory: Clear to auscultation bilaterally. Not using accessory muscles, speaking in full sentences.   Impression and Recommendations:   Fibromyalgia-Recent flare. Just encourage her to work on [employer]. She's working with her chiropractor which is helpful as well. I think part of it can be getting used to being back full-time at work.  Asthma, mild persistant -continue with Qvar. If she is noticing that she is using her albuterol 3 or more  times per week and encouraged her to go up to twice a day with her Qvar. I'll see her back in 3-4 months. Next  Insomnia-doing well on trazodone.  Flu shot given.

## 2016-04-12 ENCOUNTER — Other Ambulatory Visit: Payer: Self-pay | Admitting: Family Medicine

## 2016-05-04 ENCOUNTER — Other Ambulatory Visit: Payer: Self-pay | Admitting: Family Medicine

## 2016-05-11 ENCOUNTER — Ambulatory Visit (INDEPENDENT_AMBULATORY_CARE_PROVIDER_SITE_OTHER): Payer: BC Managed Care – PPO | Admitting: Family Medicine

## 2016-05-11 ENCOUNTER — Encounter: Payer: Self-pay | Admitting: Family Medicine

## 2016-05-11 VITALS — BP 119/68 | HR 79 | Temp 98.2°F | Wt 138.0 lb

## 2016-05-11 DIAGNOSIS — J209 Acute bronchitis, unspecified: Secondary | ICD-10-CM

## 2016-05-11 MED ORDER — IPRATROPIUM BROMIDE 0.06 % NA SOLN: 2.0000 | NASAL | 6 refills | Status: DC | PRN

## 2016-05-11 MED ORDER — BENZONATATE 200 MG PO CAPS: 200.0000 mg | ORAL_CAPSULE | Freq: Three times a day (TID) | ORAL | 0 refills | Status: DC | PRN

## 2016-05-11 MED ORDER — CEFDINIR 300 MG PO CAPS: 300.0000 mg | ORAL_CAPSULE | Freq: Two times a day (BID) | ORAL | 0 refills | Status: DC

## 2016-05-11 MED ORDER — PREDNISONE 10 MG PO TABS: 30.0000 mg | ORAL_TABLET | Freq: Every day | ORAL | 0 refills | Status: DC

## 2016-05-11 NOTE — Progress Notes (Signed)
Tara Howard is a 45 y.o. female who presents to [facility] [place] today for cough congestion runny nose. Symptoms present for a few days. Cough is productive and associated with wheezing. Patient has a history of asthma. She took some leftover prednisone which is helped a bit. Additionally she's used some leftover codeine cough syrup which has helped a bit. No vomiting or diarrhea chest pain or palpitations.   Past Medical History:  Diagnosis Date  . 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 Asthma   . Fibromyalgia   . IBS (irritable bowel syndrome)    Past Surgical History:  Procedure Laterality Date  . CESAREAN SECTION     X 2  . ENDOMETRIAL ABLATION    . TONSILLECTOMY    . TUBAL LIGATION     Social History  Substance Use Topics  . Smoking status: Never Smoker  . Smokeless tobacco: Not on file  . Alcohol use No   family history includes Depression in her mother; Hypertension in her mother.  ROS as above:  Medications: Current Outpatient Prescriptions  Medication Sig Dispense Refill  . beclomethasone (QVAR) 40 MCG/ACT inhaler Inhale 2 puffs into the lungs 2 (two) times daily. 8.7 g 3  . cetirizine (ZYRTEC) 10 MG tablet Take 10 mg by mouth daily.    Marland Kitchen FLUoxetine (PROZAC) 40 MG capsule TAKE ONE CAPSULE BY MOUTH EVERY DAY 30 capsule 3  . gabapentin (NEURONTIN) 100 MG capsule Take 2 in AM and 2 at noon 120 capsule 5  . gabapentin (NEURONTIN) 300 MG capsule Take 1 capsule (300 mg total) by mouth at bedtime. 90 capsule 1  . levalbuterol (XOPENEX HFA) 45 MCG/ACT inhaler Inhale 2 puffs into the lungs every 6 (six) hours as needed for wheezing or shortness of breath. 1 Inhaler 2  . meloxicam (MOBIC) 15 MG tablet TAKE 1/2 TABLET BY MOUTH 2 TIMES A DAY AS NEEDED FOR PAIN 30 tablet 3  . polyethylene glycol powder (GLYCOLAX/MIRALAX) powder USE AS DIRECTED PER BOTTLE 527 g 3  .  ranitidine (ZANTAC) 150 MG tablet TAKE ONE TABLET BY MOUTH 2 TIMES A DAY 60 tablet 0  . traZODone (DESYREL) 100 MG tablet TAKE 1 & 1/2 TABLETS BY MOUTH AT BEDTIME 45 tablet 11  . benzonatate (TESSALON) 200 MG capsule Take 1 capsule (200 mg total) by mouth 3 (three) times daily as needed for cough. 45 capsule 0  . cefdinir (OMNICEF) 300 MG capsule Take 1 capsule (300 mg total) by mouth 2 (two) times daily. 14 capsule 0  . ipratropium (ATROVENT) 0.06 % nasal spray Place 2 sprays into both nostrils every 4 (four) hours as needed for rhinitis. 10 mL 6  . predniSONE (DELTASONE) 10 MG tablet Take 3 tablets (30 mg total) by mouth daily with breakfast. 15 tablet 0   No current facility-administered medications for this visit.    Allergies  Allergen Reactions  . Augmentin [Amoxicillin-Pot Clavulanate] Other (See Comments)    Stomach cramps, diarrhea and vomiting  . Azithromycin Other (See Comments)    Nausea and diarrhea   . Zolpidem Tartrate Other (See Comments)    Hallucinations    Health Maintenance Health Maintenance  Topic Date Due  . HIV Screening  10/31/1987  . PAP SMEAR  07/26/2011  . TETANUS/TDAP  07/28/2021  . INFLUENZA VACCINE  Addressed     Exam:  BP 119/68   Pulse 79   Temp 98.2 F (36.8 C) (Oral)  Wt 138 lb (62.6 kg)   SpO2 98%   BMI 23.69 kg/m  Gen: Well NAD HEENT: EOMI,  MMM Clear nasal discharge. Posterior pharynx with cobblestoning. No cervical lymphadenopathy. Lungs: Normal work of breathing. CTABL Heart: RRR no MRG Abd: NABS, Soft. Nondistended, Nontender Exts: Brisk capillary refill, warm and well perfused.    No results found for this or any previous visit (from the past 72 hour(s)). No results found.    Assessment and Plan: 45 y.o. female with viral URI with bronchitis due to asthma. Continue prednisone. Use backup Omnicef antibiotics if not better. Additionally use Tessalon and Atrovent nasal spray. Return as needed. Continue inhalers.   No  orders of the defined types were placed in this encounter.   Discussed warning signs or symptoms. Please see discharge instructions. Patient expresses understanding.

## 2016-05-11 NOTE — Patient Instructions (Signed)
Thank you for coming in today. Take prednisone and uses nasal spray and Tessalon cough medication as needed. Use Omnicef antibiotics if not improving. Continue inhalers as needed. Call or go to the emergency room if you get worse, have trouble breathing, have chest pains, or palpitations.    Acute Bronchitis Bronchitis is inflammation of the airways that extend from the windpipe into the lungs (bronchi). The inflammation often causes mucus to develop. This leads to a cough, which is the most common symptom of bronchitis.  In acute bronchitis, the condition usually develops suddenly and goes away over time, usually in a couple weeks. Smoking, allergies, and asthma can make bronchitis worse. Repeated episodes of bronchitis may cause further lung problems.  CAUSES Acute bronchitis is most often caused by the same virus that causes a cold. The virus can spread from person to person (contagious) through coughing, sneezing, and touching contaminated objects. SIGNS AND SYMPTOMS   Cough.   Fever.   Coughing up mucus.   Body aches.   Chest congestion.   Chills.   Shortness of breath.   Sore throat.  DIAGNOSIS  Acute bronchitis is usually diagnosed through a physical exam. Your health care provider will also ask you questions about your medical history. Tests, such as chest X-rays, are sometimes done to rule out other conditions.  TREATMENT  Acute bronchitis usually goes away in a couple weeks. Oftentimes, no medical treatment is necessary. Medicines are sometimes given for relief of fever or cough. Antibiotic medicines are usually not needed but may be prescribed in certain situations. In some cases, an inhaler may be recommended to help reduce shortness of breath and control the cough. A cool mist vaporizer may also be used to help thin bronchial secretions and make it easier to clear the chest.  HOME CARE INSTRUCTIONS  Get plenty of rest.   Drink enough fluids to keep your  urine clear or pale yellow (unless you have a medical condition that requires fluid restriction). Increasing fluids may help thin your respiratory secretions (sputum) and reduce chest congestion, and it will prevent dehydration.   Take medicines only as directed by your health care provider.  If you were prescribed an antibiotic medicine, finish it all even if you start to feel better.  Avoid smoking and secondhand smoke. Exposure to cigarette smoke or irritating chemicals will make bronchitis worse. If you are a smoker, consider using nicotine gum or skin patches to help control withdrawal symptoms. Quitting smoking will help your lungs heal faster.   Reduce the chances of another bout of acute bronchitis by washing your hands frequently, avoiding people with cold symptoms, and trying not to touch your hands to your mouth, nose, or eyes.   Keep all follow-up visits as directed by your health care provider.  SEEK MEDICAL CARE IF: Your symptoms do not improve after 1 week of treatment.  SEEK IMMEDIATE MEDICAL CARE IF:  You develop an increased fever or chills.   You have chest pain.   You have severe shortness of breath.  You have bloody sputum.   You develop dehydration.  You faint or repeatedly feel like you are going to pass out.  You develop repeated vomiting.  You develop a severe headache. MAKE SURE YOU:   Understand these instructions.  Will watch your condition.  Will get help right away if you are not doing well or get worse.   This information is not intended to replace advice given to you by your health care provider.  Make sure you discuss any questions you have with your health care provider.   Document Released: 08/18/2004 Document Revised: 08/01/2014 Document Reviewed: 01/01/2013 Elsevier Interactive Patient Education [employer].

## 2016-05-13 ENCOUNTER — Other Ambulatory Visit: Payer: Self-pay | Admitting: Family Medicine

## 2016-05-20 ENCOUNTER — Other Ambulatory Visit: Payer: Self-pay | Admitting: Family Medicine

## 2016-06-14 ENCOUNTER — Telehealth: Payer: Self-pay | Admitting: *Deleted

## 2016-06-14 NOTE — Telephone Encounter (Signed)
Pt lvm stating that [facility] and was given tessalon for the cough. She stated that she still has a cough and wanted to know if Dr. Madilyn Fireman would send in a cough med for her. Will fwd to pcp for advice.Tara Howard

## 2016-06-15 ENCOUNTER — Encounter: Payer: Self-pay | Admitting: Family Medicine

## 2016-06-15 ENCOUNTER — Ambulatory Visit (INDEPENDENT_AMBULATORY_CARE_PROVIDER_SITE_OTHER): Payer: BC Managed Care – PPO | Admitting: Family Medicine

## 2016-06-15 VITALS — BP 106/61 | HR 91 | Temp 98.3°F | Ht 64.0 in | Wt 140.0 lb

## 2016-06-15 DIAGNOSIS — J0140 Acute pansinusitis, unspecified: Secondary | ICD-10-CM

## 2016-06-15 MED ORDER — PREDNISONE 20 MG PO TABS: 40.0000 mg | ORAL_TABLET | Freq: Every day | ORAL | 0 refills | Status: DC

## 2016-06-15 MED ORDER — RANITIDINE HCL 150 MG PO TABS: ORAL_TABLET | ORAL | 3 refills | Status: DC

## 2016-06-15 MED ORDER — HYDROCODONE-HOMATROPINE 5-1.5 MG/5ML PO SYRP: 5.0000 mL | ORAL_SOLUTION | Freq: Every evening | ORAL | 0 refills | Status: DC | PRN

## 2016-06-15 NOTE — Telephone Encounter (Signed)
Called and informed pt that rx for cough syrup has been written. Pt stated that she made an appt to be seen today she feels that she may need something else.Tara Howard

## 2016-06-15 NOTE — Telephone Encounter (Signed)
Printed prescription for Hycodan syrup. She will have to come pick up the hard copy prescription. Only use at bedtime. If she is not feeling better by Monday then encourage her to call and make an appointment.

## 2016-06-15 NOTE — Progress Notes (Signed)
   Subjective:    Patient ID: Tara Howard, female    DOB: 05/25/1971, 45 y.o.   MRN: [id number]  HPI  45 year old female started feeling poorly about 14 days ago. She started out mostly with nasal congestion. It has now progressed into her chest with a cough. In fact she actually went to [facility] on Sunday, 4 days ago. She was diagnosed with a sinus infection and started on Augmentin. Today is day 4 of medication. She has noticed the color of the nasal congestion has started to look a little bit more normal but she still has enormous amounts of pressure in her head and ears and jaw. Concerned about the cough as well. No wheezing or shortness of breath. She has had some chills but not sure if she's had a tree fever or not. No nausea vomiting or diarrhea. She's been taking Mucinex DM and Sudafed.  Review of Systems     Objective:   Physical Exam  Constitutional: She is oriented to person, place, and time. She appears well-developed and well-nourished.  HENT:  Head: Normocephalic and atraumatic.  Right Ear: External ear normal.  Left Ear: External ear normal.  Nose: Nose normal.  Mouth/Throat: Oropharynx is clear and moist.  TMs and canals are clear.   Eyes: Conjunctivae and EOM are normal. Pupils are equal, round, and reactive to light.  Neck: Neck supple. No thyromegaly present.  Cardiovascular: Normal rate, regular rhythm and normal heart sounds.   Pulmonary/Chest: Effort normal and breath sounds normal. She has no wheezes.  Lymphadenopathy:    She has no cervical adenopathy.  Neurological: She is alert and oriented to person, place, and time.  Skin: Skin is warm and dry.  Psychiatric: She has a normal mood and affect.       Assessment & Plan:  Acute sinusitis-complete the Augmentin. Will add a prednisone burst for 5 days. Also given cough syrup for nighttime only. Warned about potential for sedation.

## 2016-06-15 NOTE — Addendum Note (Signed)
Addended by: Teddy Spike on: 06/15/2016 11:17 AM   Modules accepted: Orders

## 2016-06-17 ENCOUNTER — Other Ambulatory Visit: Payer: Self-pay | Admitting: Sports Medicine

## 2016-07-29 ENCOUNTER — Other Ambulatory Visit: Payer: Self-pay | Admitting: Family Medicine

## 2016-07-29 ENCOUNTER — Other Ambulatory Visit: Payer: Self-pay

## 2016-07-29 MED ORDER — FLUOXETINE HCL 40 MG PO CAPS: ORAL_CAPSULE | ORAL | 0 refills | Status: DC

## 2016-08-02 ENCOUNTER — Encounter: Payer: Self-pay | Admitting: Family Medicine

## 2016-08-02 ENCOUNTER — Ambulatory Visit (INDEPENDENT_AMBULATORY_CARE_PROVIDER_SITE_OTHER): Payer: BC Managed Care – PPO | Admitting: Family Medicine

## 2016-08-02 VITALS — BP 98/56 | HR 87 | Ht 64.0 in | Wt 143.0 lb

## 2016-08-02 DIAGNOSIS — Z8249 Family history of ischemic heart disease and other diseases of the circulatory system: Secondary | ICD-10-CM | POA: Diagnosis not present

## 2016-08-02 DIAGNOSIS — M797 Fibromyalgia: Secondary | ICD-10-CM | POA: Diagnosis not present

## 2016-08-02 DIAGNOSIS — N951 Menopausal and female climacteric states: Secondary | ICD-10-CM

## 2016-08-02 DIAGNOSIS — Z1231 Encounter for screening mammogram for malignant neoplasm of breast: Secondary | ICD-10-CM

## 2016-08-02 MED ORDER — BECLOMETHASONE DIPROPIONATE 40 MCG/ACT IN AERS: 2.0000 | INHALATION_SPRAY | Freq: Two times a day (BID) | RESPIRATORY_TRACT | 3 refills | Status: DC

## 2016-08-02 NOTE — Progress Notes (Addendum)
Subjective:    CC: Fibro, Asthma   HPI:  Follow-up fibromyalgia- she is on fluoxetine and Doing well with the medication. New problems or side effects. She did come off of her gabapentin. She ran out of it while she was in [place] visiting family. Her mother had an heart attack and ended up with triple bypass surgery. She says that she symmetric home she's noted she's had a little bit more achiness and is not sleeping as well but says she really wants to give it a few more weeks before restarting the daytime gabapentin. She is still taking the 300 mg at bedtime. We decided to keep it on her medication list in case she decides she wants it refilled.  Follow-up asthma- she is on QVar and zyrtec.  Doing well on current regimen. She is using the Xopenex occasionally. She said she did run out of the Qvar and heavy uses it the next couple of times.  Would also like to have blood work to see if she could be going into menopause. She feels like she starting to have some symptoms. Her sister went into menopause before age 23 and her mother went prematurely as well.  BP (!) 98/56   Pulse 87   Ht 5\' 4"  (1.626 m)   Wt 143 lb (64.9 kg)   SpO2 100%   BMI 24.55 kg/m     Allergies  Allergen Reactions  . Azithromycin Other (See Comments) and Nausea And Vomiting    Nausea and diarrhea   . Augmentin [Amoxicillin-Pot Clavulanate] Other (See Comments)    Stomach cramps, diarrhea and vomiting  . Zolpidem Tartrate Other (See Comments)    Hallucinations    Past Medical History:  Diagnosis Date  . 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 Asthma   . Fibromyalgia   . IBS (irritable bowel syndrome)     Past Surgical History:  Procedure Laterality Date  . CESAREAN SECTION     X 2  . ENDOMETRIAL ABLATION    . TONSILLECTOMY    . TUBAL LIGATION      Social History   Social History  . Marital status: Married    Spouse name: N/A  . Number of children: N/A  . Years of education: N/A   Occupational History   . Not on file.   Social History Main Topics  . Smoking status: Never Smoker  . Smokeless tobacco: Not on file  . Alcohol use No  . Drug use: Unknown  . Sexual activity: Yes   Other Topics Concern  . Not on file   Social History Narrative  . No narrative on file    Family History  Problem Relation Age of Onset  . CAD Father     3 vessel bypass  . Depression Mother   . Hypertension Mother   . Heart attack Mother   . CAD Mother     3 vessel bypass    Outpatient Encounter Prescriptions as of 08/02/2016  Medication Sig  . beclomethasone (QVAR) 40 MCG/ACT inhaler Inhale 2 puffs into the lungs 2 (two) times daily.  . cetirizine (ZYRTEC) 10 MG tablet Take 10 mg by mouth daily.  Marland Kitchen FLUoxetine (PROZAC) 40 MG capsule TAKE ONE CAPSULE BY MOUTH EVERY DAY  . gabapentin (NEURONTIN) 100 MG capsule   . gabapentin (NEURONTIN) 300 MG capsule Take 1 capsule (300 mg total) by mouth at bedtime.  Marland Kitchen ipratropium (ATROVENT) 0.06 % nasal spray Place 2 sprays into both nostrils every 4 (  four) hours as needed for rhinitis.  Marland Kitchen levalbuterol (XOPENEX HFA) 45 MCG/ACT inhaler Inhale 2 puffs into the lungs every 6 (six) hours as needed for wheezing or shortness of breath.  . meloxicam (MOBIC) 15 MG tablet TAKE 1/2 TABLET BY MOUTH 2 TIMES A DAY AS NEEDED FOR PAIN  . polyethylene glycol powder (GLYCOLAX/MIRALAX) powder USE AS DIRECTED PER BOTTLE  . QNASL 80 MCG/ACT AERS   . ranitidine (ZANTAC) 150 MG tablet TAKE ONE TABLET BY MOUTH 2 TIMES A DAY  . traZODone (DESYREL) 100 MG tablet TAKE 1 & 1/2 TABLETS BY MOUTH AT BEDTIME  . [DISCONTINUED] beclomethasone (QVAR) 40 MCG/ACT inhaler Inhale 2 puffs into the lungs 2 (two) times daily.  . [DISCONTINUED] amoxicillin-clavulanate (AUGMENTIN) 875-125 MG tablet   . [DISCONTINUED] HYDROcodone-homatropine (HYCODAN) 5-1.5 MG/5ML syrup Take 5 mLs by mouth at bedtime as needed for cough.  . [DISCONTINUED] predniSONE (DELTASONE) 20 MG tablet Take 2 tablets (40 mg total) by  mouth daily.   No facility-administered encounter medications on file as of 08/02/2016.         Review of Systems: No fevers, chills, night sweats, weight loss, chest pain, or shortness of breath.   Objective:    General: Well Developed, well nourished, and in no acute distress.  Neuro: Alert and oriented x3, extra-ocular muscles intact, sensation grossly intact.  HEENT: Normocephalic, atraumatic  Skin: Warm and dry, no rashes. Cardiac: Regular rate and rhythm, no murmurs rubs or gallops, no lower extremity edema.  Respiratory: Clear to auscultation bilaterally. Not using accessory muscles, speaking in full sentences.   Impression and Recommendations:   Fibromyalgia-Continue current regimen. She can certainly restart the gabapentin any point if she would like to.  Asthma - stable. Will refill Qvar. Please call if using albuterol more frequently.  Family history of heart disease-strong encourage her to get to the lab and have her cholesterol checked. We had ordered it back in the fall but she has not had a chance to go. Reprinted the lab form and gave it to her today.  Insomnia-she continues to take her trazodone. And her gabapentin 300 mg at bedtime.  Will do lab evaluation to see if she could be postmenopausal.

## 2016-08-08 ENCOUNTER — Other Ambulatory Visit: Payer: Self-pay

## 2016-08-08 DIAGNOSIS — M797 Fibromyalgia: Secondary | ICD-10-CM

## 2016-08-08 DIAGNOSIS — R5383 Other fatigue: Secondary | ICD-10-CM

## 2016-08-08 DIAGNOSIS — Z13220 Encounter for screening for lipoid disorders: Secondary | ICD-10-CM

## 2016-08-08 LAB — BASIC METABOLIC PANEL
BUN: 12 mg/dL (ref 4–21)
BUN: 18 mg/dL (ref 4–21)
CREATININE: 0.7 mg/dL (ref 0.5–1.1)
Creatinine: 0.7 mg/dL (ref 0.5–1.1)
GLUCOSE: 86 mg/dL
Glucose: 86 mg/dL
POTASSIUM: 4.3 mmol/L (ref 3.4–5.3)
Potassium: 4.3 mmol/L (ref 3.4–5.3)
SODIUM: 140 mmol/L (ref 137–147)
Sodium: 140 mmol/L (ref 137–147)

## 2016-08-08 LAB — LUTEINIZING HORMONE
FSH: 1.2
LH: 1.9

## 2016-08-08 LAB — HEPATIC FUNCTION PANEL
ALT: 13 U/L (ref 7–35)
ALT: 13 U/L (ref 7–35)
AST: 15 U/L (ref 13–35)
AST: 15 U/L (ref 13–35)
Alkaline Phosphatase: 51 U/L (ref 25–125)
Bilirubin, Total: 0.3 mg/dL

## 2016-08-08 LAB — ESTRADIOL, FREE
Estradiol, Serum, MS: 431
FREE ESTRADIOL, PERCENT: 1.4
FREE ESTRADIOL, SERUM: 6

## 2016-08-08 LAB — LIPID PANEL
CHOLESTEROL: 146 mg/dL (ref 0–200)
Cholesterol: 146 mg/dL (ref 0–200)
HDL: 48 mg/dL (ref 35–70)
HDL: 48 mg/dL (ref 35–70)
LDL CALC: 85 mg/dL
LDL Cholesterol: 85 mg/dL
TRIGLYCERIDES: 65 mg/dL (ref 40–160)
Triglycerides: 65 mg/dL (ref 40–160)

## 2016-08-12 ENCOUNTER — Telehealth: Payer: Self-pay | Admitting: Family Medicine

## 2016-08-12 ENCOUNTER — Other Ambulatory Visit: Payer: Self-pay | Admitting: Family Medicine

## 2016-08-12 NOTE — Telephone Encounter (Signed)
Call pt: Metabolic panel is normal including liver and kidney function. Total cholesterol and LDL look great. Triglycerides and HDL are normal as well. Female hormone labs show that she is not in menopause yet. Tara Lecher, MD

## 2016-08-12 NOTE — Telephone Encounter (Signed)
Pt advised of results, verbalized understanding. No further questions.  

## 2016-08-26 ENCOUNTER — Other Ambulatory Visit: Payer: Self-pay | Admitting: Family Medicine

## 2016-08-29 ENCOUNTER — Other Ambulatory Visit: Payer: Self-pay | Admitting: *Deleted

## 2016-08-29 ENCOUNTER — Other Ambulatory Visit (HOSPITAL_COMMUNITY)
Admission: RE | Admit: 2016-08-29 | Discharge: 2016-08-29 | Disposition: A | Discharge status: Home / Self Care | Payer: BC Managed Care – PPO | Source: Ambulatory Visit | Attending: Family Medicine | Admitting: Family Medicine

## 2016-08-29 ENCOUNTER — Ambulatory Visit (INDEPENDENT_AMBULATORY_CARE_PROVIDER_SITE_OTHER): Payer: BC Managed Care – PPO | Admitting: Family Medicine

## 2016-08-29 ENCOUNTER — Encounter: Payer: Self-pay | Admitting: Family Medicine

## 2016-08-29 VITALS — BP 123/63 | HR 83 | Ht 64.0 in | Wt 145.0 lb

## 2016-08-29 DIAGNOSIS — Z01419 Encounter for gynecological examination (general) (routine) without abnormal findings: Secondary | ICD-10-CM | POA: Diagnosis not present

## 2016-08-29 DIAGNOSIS — R251 Tremor, unspecified: Secondary | ICD-10-CM | POA: Diagnosis not present

## 2016-08-29 DIAGNOSIS — Z113 Encounter for screening for infections with a predominantly sexual mode of transmission: Secondary | ICD-10-CM | POA: Diagnosis present

## 2016-08-29 DIAGNOSIS — N942 Vaginismus: Secondary | ICD-10-CM | POA: Insufficient documentation

## 2016-08-29 DIAGNOSIS — Z1151 Encounter for screening for human papillomavirus (HPV): Secondary | ICD-10-CM | POA: Insufficient documentation

## 2016-08-29 MED ORDER — MELOXICAM 15 MG PO TABS: ORAL_TABLET | ORAL | 3 refills | Status: DC

## 2016-08-29 MED ORDER — GABAPENTIN 100 MG PO CAPS: 100.0000 mg | ORAL_CAPSULE | Freq: Two times a day (BID) | ORAL | 5 refills | Status: DC

## 2016-08-29 NOTE — Patient Instructions (Signed)
Keep up a regular exercise program and make sure you are eating a healthy diet Try to eat 4 servings of dairy a day, or if you are lactose intolerant take a calcium with vitamin D daily.  Your vaccines are up to date.   

## 2016-08-29 NOTE — Addendum Note (Signed)
Addended by: Teddy Spike on: 08/29/2016 04:24 PM   Modules accepted: Orders

## 2016-08-29 NOTE — Progress Notes (Signed)
Subjective:     Tara Howard is a 46 y.o. female and is here for a comprehensive physical exam. The patient reports problems - tremor in one hand for years.  she says it's in both hands. Does not affect her limbs or her neck. She says sometimes it seems more intense than other times. She says most the time it's very minor. Is not disruptive to eating. She does not drink alcohol.  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 has tried using dilators, had used a special compounded cream and even had surgery without any significant results.  Social History   Social History  . Marital status: Married    Spouse name: N/A  . Number of children: N/A  . Years of education: N/A   Occupational History  . Not on file.   Social History Main Topics  . Smoking status: Never Smoker  . Smokeless tobacco: Never Used  . Alcohol use No  . Drug use: Unknown  . Sexual activity: Yes   Other Topics Concern  . Not on file   Social History Narrative  . No narrative on file   Health Maintenance  Topic Date Due  . HIV Screening  10/31/1987  . PAP SMEAR  07/26/2011  . TETANUS/TDAP  07/28/2021  . INFLUENZA VACCINE  Addressed    The following portions of the patient's history were reviewed and updated as appropriate: allergies, current medications, past family history, past medical history, past social history, past surgical history and problem list.  Review of Systems A comprehensive review of systems was negative.   Objective:    BP 123/63   Pulse 83   Ht 5\' 4"  (1.626 m)   Wt 145 lb (65.8 kg)   SpO2 100%   BMI 24.89 kg/m  General appearance: alert, cooperative and appears stated age Head: Normocephalic, without obvious abnormality, atraumatic Eyes: conj clear, EOMI, PEERLA Ears: normal TM's and external ear canals both ears Nose: Nares normal. Septum midline. Mucosa normal. No drainage or sinus tenderness. Throat: lips, mucosa, and tongue normal; teeth and  gums normal Neck: no adenopathy, no carotid bruit, no JVD, supple, symmetrical, trachea midline and thyroid not enlarged, symmetric, no tenderness/mass/nodules Back: symmetric, no curvature. ROM normal. No CVA tenderness. Lungs: clear to auscultation bilaterally Breasts: normal appearance, no masses or tenderness Heart: regular rate and rhythm, S1, S2 normal, no murmur, click, rub or gallop Abdomen: soft, non-tender; bowel sounds normal; no masses,  no organomegaly Pelvic: cervix normal in appearance, external genitalia normal, no adnexal masses or tenderness, no cervical motion tenderness, rectovaginal septum normal, uterus normal size, shape, and consistency and vagina normal without discharge Extremities: extremities normal, atraumatic, no cyanosis or edema Pulses: 2+ and symmetric Skin: Skin color, texture, turgor normal. No rashes or lesions Lymph nodes: Cervical, supraclavicular, and axillary nodes normal. Neurologic: Alert and oriented X 3, normal strength and tone. Normal symmetric reflexes. Normal coordination and gait    Assessment:    Healthy female exam.      Plan:     See After Visit Summary for Counseling Recommendations   Keep up a regular exercise program and make sure you are eating a healthy diet Try to eat 4 servings of dairy a day, or if you are lactose intolerant take a calcium with vitamin D daily.  Your vaccines are up to date.  Reminded her to schedule her mammogram. Last one was a few years ago. She did have her labs done through [facility].  Tremor-likely from her fluoxetine. Certainly if it worsens or changes or moves positions and please let me know. We discussed options including stopping the fluoxetine to see if the tremor resolves and she declined.

## 2016-09-01 LAB — CYTOLOGY - PAP
Bacterial vaginitis: NEGATIVE
CANDIDA VAGINITIS: NEGATIVE
Diagnosis: NEGATIVE
HPV: NOT DETECTED

## 2016-09-01 NOTE — Progress Notes (Signed)
Call patient: Your Pap smear is normal. Repeat in 5 years.Negative for overgroth of bacteria or yeast on pap smear.

## 2016-09-06 ENCOUNTER — Encounter: Payer: Self-pay | Admitting: Family Medicine

## 2016-09-07 ENCOUNTER — Telehealth: Payer: Self-pay | Admitting: Family Medicine

## 2016-09-07 NOTE — Telephone Encounter (Signed)
Please call patient and tell her I did do a little research on the condition that we discussed. I was trying to find if there was any literature on using Botox for this and I could not find anything that supports the use of this for treatment.  Beatrice Lecher, MD

## 2016-09-07 NOTE — Telephone Encounter (Signed)
Pt informed.Tara Howard  

## 2016-09-23 ENCOUNTER — Encounter: Payer: Self-pay | Admitting: Family Medicine

## 2016-09-23 ENCOUNTER — Ambulatory Visit (INDEPENDENT_AMBULATORY_CARE_PROVIDER_SITE_OTHER): Payer: BC Managed Care – PPO | Admitting: Family Medicine

## 2016-09-23 VITALS — BP 110/60 | HR 75 | Ht 64.0 in | Wt 142.0 lb

## 2016-09-23 DIAGNOSIS — J018 Other acute sinusitis: Secondary | ICD-10-CM

## 2016-09-23 MED ORDER — PREDNISONE 20 MG PO TABS: 40.0000 mg | ORAL_TABLET | Freq: Every day | ORAL | 0 refills | Status: DC

## 2016-09-23 MED ORDER — PROMETHAZINE-DM 6.25-15 MG/5ML PO SYRP: 5.0000 mL | ORAL_SOLUTION | Freq: Every evening | ORAL | 0 refills | Status: DC | PRN

## 2016-09-23 MED ORDER — CEFDINIR 300 MG PO CAPS: 300.0000 mg | ORAL_CAPSULE | Freq: Two times a day (BID) | ORAL | 0 refills | Status: DC

## 2016-09-23 NOTE — Progress Notes (Signed)
   Subjective:    Patient ID: Tara Howard, female    DOB: September 23, 1970, 46 y.o.   MRN: [id number]  HPI Patient comes in today complaining of 2 weeks of nasal congestion and cough with green mucus and green sputum. She has not had any fevers chills or sweats. She has had a mild sore throat. No ear pain. She has had a little bit of nausea but no diarrhea. She's been using some cough medicine that she was given previously sleep that she says it's really been aggravating her reflux and really makes her very nauseated if she takes it for more than 2 nights. She's also been taking her regular allergy medications.   Review of Systems     Objective:   Physical Exam  Constitutional: She is oriented to person, place, and time. She appears well-developed and well-nourished.  HENT:  Head: Normocephalic and atraumatic.  Right Ear: External ear normal.  Left Ear: External ear normal.  Nose: Nose normal.  Mouth/Throat: Oropharynx is clear and moist.  TMs and canals are clear.   Eyes: Conjunctivae and EOM are normal. Pupils are equal, round, and reactive to light.  Neck: Neck supple. No thyromegaly present.  Mild ant cervical LNs.   Cardiovascular: Normal rate, regular rhythm and normal heart sounds.   Pulmonary/Chest: Effort normal and breath sounds normal. She has no wheezes.  Lymphadenopathy:    She has cervical adenopathy.  Neurological: She is alert and oriented to person, place, and time.  Skin: Skin is warm and dry.  Psychiatric: She has a normal mood and affect.        Assessment & Plan:  Acute sinusitis-we'll treat with Omnicef 7 days. I really don't think she needs prednisone at this point but we'll go ahead and call in so that if she feels like she is getting worse or becoming more short of breath over the weekend and she can fill it. Also sent her prescription for cough medicine since the Hycodan is aggravating her reflux.

## 2016-09-27 ENCOUNTER — Other Ambulatory Visit: Payer: Self-pay | Admitting: Family Medicine

## 2016-10-03 ENCOUNTER — Encounter: Payer: Self-pay | Admitting: Family Medicine

## 2016-10-10 ENCOUNTER — Other Ambulatory Visit: Payer: Self-pay | Admitting: Family Medicine

## 2016-10-19 ENCOUNTER — Ambulatory Visit (INDEPENDENT_AMBULATORY_CARE_PROVIDER_SITE_OTHER): Payer: BC Managed Care – PPO

## 2016-10-19 DIAGNOSIS — R928 Other abnormal and inconclusive findings on diagnostic imaging of breast: Secondary | ICD-10-CM

## 2016-10-19 DIAGNOSIS — Z1231 Encounter for screening mammogram for malignant neoplasm of breast: Secondary | ICD-10-CM

## 2016-10-21 ENCOUNTER — Other Ambulatory Visit: Payer: Self-pay | Admitting: Family Medicine

## 2016-10-21 DIAGNOSIS — R928 Other abnormal and inconclusive findings on diagnostic imaging of breast: Secondary | ICD-10-CM

## 2016-10-27 ENCOUNTER — Ambulatory Visit
Admission: RE | Admit: 2016-10-27 | Discharge: 2016-10-27 | Disposition: A | Discharge status: Home / Self Care | Payer: BC Managed Care – PPO | Source: Ambulatory Visit | Attending: Family Medicine | Admitting: Family Medicine

## 2016-10-27 DIAGNOSIS — R928 Other abnormal and inconclusive findings on diagnostic imaging of breast: Secondary | ICD-10-CM

## 2016-11-09 ENCOUNTER — Other Ambulatory Visit: Payer: Self-pay | Admitting: Family Medicine

## 2016-11-10 ENCOUNTER — Other Ambulatory Visit: Payer: Self-pay | Admitting: Family Medicine

## 2016-11-28 ENCOUNTER — Ambulatory Visit: Payer: BC Managed Care – PPO | Admitting: Family Medicine

## 2016-11-29 ENCOUNTER — Ambulatory Visit (INDEPENDENT_AMBULATORY_CARE_PROVIDER_SITE_OTHER): Payer: BC Managed Care – PPO | Admitting: Family Medicine

## 2016-11-29 ENCOUNTER — Encounter: Payer: Self-pay | Admitting: Family Medicine

## 2016-11-29 VITALS — BP 124/72 | HR 74 | Wt 145.0 lb

## 2016-11-29 DIAGNOSIS — G43009 Migraine without aura, not intractable, without status migrainosus: Secondary | ICD-10-CM

## 2016-11-29 DIAGNOSIS — J4530 Mild persistent asthma, uncomplicated: Secondary | ICD-10-CM | POA: Diagnosis not present

## 2016-11-29 DIAGNOSIS — K5909 Other constipation: Secondary | ICD-10-CM

## 2016-11-29 DIAGNOSIS — M797 Fibromyalgia: Secondary | ICD-10-CM | POA: Diagnosis not present

## 2016-11-29 MED ORDER — POLYETHYLENE GLYCOL 3350 17 GM/SCOOP PO POWD
ORAL | 99 refills | Status: DC
Start: 2016-11-29 — End: 2017-10-09

## 2016-11-29 MED ORDER — BECLOMETHASONE DIPROPIONATE 40 MCG/ACT IN AERS: 2.0000 | INHALATION_SPRAY | Freq: Two times a day (BID) | RESPIRATORY_TRACT | 3 refills | Status: DC

## 2016-11-29 MED ORDER — FLUOXETINE HCL 40 MG PO CAPS: 40.0000 mg | ORAL_CAPSULE | Freq: Every day | ORAL | 3 refills | Status: DC

## 2016-11-29 MED ORDER — TIZANIDINE HCL 4 MG PO TABS: 2.0000 mg | ORAL_TABLET | Freq: Three times a day (TID) | ORAL | 0 refills | Status: DC | PRN

## 2016-11-29 NOTE — Progress Notes (Signed)
Subjective:    CC: Fibro  HPI:  Four-month follow-up for fibromyalgia.  She has been doing okay but in the last couple months she feels like she's had a flare with her fibromyalgia. She's had more aches and pains. That she actually has been sleeping well which that part has been great. She is getting more frequent migraines but she definitely feels like it's been coming from her neck. She is lying a lot of pain and tension in her shoulders and neck. She's been seeing her chiropractor regularly. She's been doing her stretches trying to walk regularly for exercise. More recently she started taking some ibuprofen and on some days has been taking half of a tab of her meloxicam.  Follow-up asthma. She is doing well. She's currently on Qvar. She's not had any excess her frequent exacerbations.  Chronic constipation-sometimes she has using MiraLAX twice a day and says she's been running short on her prescription. She wants the prescription could be updated to a larger jar.  Past medical history, Surgical history, Family history not pertinant except as noted below, Social history, Allergies, and medications have been entered into the medical record, reviewed, and corrections made.   Review of Systems: No fevers, chills, night sweats, weight loss, chest pain, or shortness of breath.   Objective:    General: Well Developed, well nourished, and in no acute distress.  Neuro: Alert and oriented x3, extra-ocular muscles intact, sensation grossly intact.  HEENT: Normocephalic, atraumatic  Skin: Warm and dry, no rashes. Cardiac: Regular rate and rhythm, no murmurs rubs or gallops, no lower extremity edema.  Respiratory: Clear to auscultation bilaterally. Not using accessory muscles, speaking in full sentences.   Impression and Recommendations:   Fibromyalgia-I like to add a muscle relaxer for a short period of time. She says Flexeril since to help but it causes constipation. She has never tried Zanaflex.  We'll start with this to see if it's helpful. She can always increase her MiraLAX if needed. Follow-up in 4 months.  Chronic, Constipation-prescription updated and sent for largest container available. Next  Migraine headaches-seem to be more frequent with her recent neck pain. We discussed options of referral to physical therapy instead of doing chiropractic care and maybe even getting a cervical spine film if not improving. She is hoping that some of her stressors will decrease over the next couple weeks as school ins.   Asthma - table. Refilled her Qvar through the spring. Hopefully after that she may be able to come off and just use her albuterol as needed. Follow-up in 4 months.

## 2016-12-10 ENCOUNTER — Other Ambulatory Visit: Payer: Self-pay | Admitting: Family Medicine

## 2016-12-23 ENCOUNTER — Encounter: Payer: Self-pay | Admitting: Family Medicine

## 2016-12-23 ENCOUNTER — Telehealth: Payer: Self-pay | Admitting: *Deleted

## 2016-12-23 ENCOUNTER — Ambulatory Visit (INDEPENDENT_AMBULATORY_CARE_PROVIDER_SITE_OTHER): Payer: BC Managed Care – PPO | Admitting: Family Medicine

## 2016-12-23 VITALS — BP 112/68 | HR 76 | Ht 64.27 in | Wt 144.0 lb

## 2016-12-23 DIAGNOSIS — K581 Irritable bowel syndrome with constipation: Secondary | ICD-10-CM | POA: Diagnosis not present

## 2016-12-23 MED ORDER — LUBIPROSTONE 8 MCG PO CAPS: 8.0000 ug | ORAL_CAPSULE | Freq: Two times a day (BID) | ORAL | 5 refills | Status: DC

## 2016-12-23 NOTE — Telephone Encounter (Signed)
Spoke w/Tara Howard and advised her that she should make an appt. Informed her that Dr. Madilyn Fireman advised that she can try magnesium citrate, probiotic (for the gas), or levbid(for cramping/pain).Tara Howard Tara Howard Tara Howard will keep appt.Tara Howard [place]

## 2016-12-23 NOTE — Patient Instructions (Addendum)
Can try liquid magnesium citrate.  Repeat dose next day if needed.   If not helping can try Ex-Lax.    Can use IBGuard ( peppermint oil) capsules.    Sent over Rx for [employer]

## 2016-12-23 NOTE — Telephone Encounter (Signed)
Pt called and lvm indicating that she has either a bowel blockage or is constipated and would like hear back from someone before the w/e. She reports that she has a hx of IBS w/constipation. She said that she has been trying to treat this on her on by increasing the amount of miralax from 2 caps to 3. She said that she has low R band and abdominal pain.Tara Howard

## 2016-12-23 NOTE — Progress Notes (Signed)
Subjective:    Patient ID: Tara Howard, female    DOB: Mar 05, 1971, 46 y.o.   MRN: [id number]  HPI 46 year old female comes in today compared planning of right lower quadrant pain radiating to her back. She has a history of IBS. She has been able to move her stools some. She's recently increased her MiraLAX from 2 capsules up to 3 capfuls. She has tried things like magnesium citrate in the past but it caused a lot of cramping.  She has been gassy as well.  Pain is intermittent and stabbing. No nausea or vomiting.     Review of Systems   BP 112/68   Pulse 76   Ht 5' 4.27" (1.633 m)   Wt 144 lb (65.3 kg)   SpO2 98%   BMI 24.51 kg/m     Allergies  Allergen Reactions  . Azithromycin Other (See Comments) and Nausea And Vomiting    Nausea and diarrhea   . Augmentin [Amoxicillin-Pot Clavulanate] Other (See Comments)    Stomach cramps, diarrhea and vomiting  . Flexeril [Cyclobenzaprine] Other (See Comments)    constipation  . Zolpidem Tartrate Other (See Comments)    Hallucinations    Past Medical History:  Diagnosis Date  . 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 Asthma   . Fibromyalgia   . IBS (irritable bowel syndrome)     Past Surgical History:  Procedure Laterality Date  . CESAREAN SECTION     X 2  . ENDOMETRIAL ABLATION    . TONSILLECTOMY    . TUBAL LIGATION      Social History   Social History  . Marital status: Married    Spouse name: N/A  . Number of children: N/A  . Years of education: N/A   Occupational History  . Not on file.   Social History Main Topics  . Smoking status: Never Smoker  . Smokeless tobacco: Never Used  . Alcohol use No  . Drug use: Unknown  . Sexual activity: Yes   Other Topics Concern  . Not on file   Social History Narrative  . No narrative on file    Family History  Problem Relation Age of Onset  . CAD Father        3 vessel bypass  . Depression Mother   . Hypertension Mother   . Heart attack Mother   . CAD Mother        3 vessel bypass    Outpatient Encounter Prescriptions as of 12/23/2016  Medication Sig  . beclomethasone (QVAR) 40 MCG/ACT inhaler Inhale 2 puffs into the lungs 2 (two) times daily.  . cetirizine (ZYRTEC) 10 MG tablet Take 10 mg by mouth daily.  Marland Kitchen FLUoxetine (PROZAC) 40 MG capsule Take 1 capsule (40 mg total) by mouth daily.  Marland Kitchen gabapentin (NEURONTIN) 100 MG capsule Take 1 capsule (100 mg total) by mouth 2 (two) times daily.  Marland Kitchen gabapentin (NEURONTIN) 300 MG capsule TAKE ONE CAPSULE BY MOUTH AT BEDTIME  . levalbuterol (XOPENEX HFA) 45 MCG/ACT inhaler Inhale 2 puffs into the lungs every 6 (six) hours as needed for wheezing or shortness of breath.  . meloxicam (MOBIC) 15 MG tablet TAKE 1/2 TABLET BY MOUTH 2 TIMES A DAY AS NEEDED FOR PAIN  . polyethylene glycol powder (GLYCOLAX/MIRALAX) powder 1capful mixed with 8 ounces of water BID prn.  . QNASL 80 MCG/ACT AERS PLACE 2 SPRAYS INTO BOTH NOSTRILS DAILY  . ranitidine (ZANTAC) 150 MG tablet TAKE ONE TABLET BY MOUTH 2  TIMES A DAY  . traZODone (DESYREL) 100 MG tablet TAKE 1 & 1/2 TABLETS BY MOUTH AT BEDTIME  . lubiprostone (AMITIZA) 8 MCG capsule Take 1 capsule (8 mcg total) by mouth 2 (two) times daily with a meal.  . [DISCONTINUED] predniSONE (DELTASONE) 20 MG tablet Take 2 tablets (40 mg total) by mouth daily.  . [DISCONTINUED] promethazine-dextromethorphan (PROMETHAZINE-DM) 6.25-15 MG/5ML syrup Take 5 mLs by mouth at bedtime as needed for cough.  . [DISCONTINUED] tiZANidine (ZANAFLEX) 4 MG tablet Take 0.5-1 tablets (2-4 mg total) by mouth every 8 (eight) hours as needed for muscle spasms.   No facility-administered encounter medications on file as of 12/23/2016.          Objective:   Physical Exam  Constitutional: She is oriented to person, place, and time. She appears well-developed and well-nourished.  HENT:  Head: Normocephalic and atraumatic.  Cardiovascular: Normal rate, regular rhythm and normal heart sounds.   Pulmonary/Chest:  Effort normal and breath sounds normal.  Abdominal: Soft. Bowel sounds are normal. She exhibits no distension and no mass. There is tenderness. There is no rebound and no guarding.  + TTP in the RLQ, LLQ and suprapubic area.   Neurological: She is alert and oriented to person, place, and time.  Skin: Skin is warm and dry.  Psychiatric: She has a normal mood and affect. Her behavior is normal.        Assessment & Plan:  IBS - D- I suspect her pain is probably her IBS. She's not having any urinary symptoms. No guarding or rebound. We discussed for short-term using a more similar type laxity. Recommended magnesium citrate liquid over the weekend to get her bowels moving even more. We also discussed using an antispasmodic such as hyoscyamine or dicyclomine. Also thinning think she might benefit from some IBgard more long-term. Also could consider a trial of Amitiza instead of the MiraLAX. She would like something that she can take less often and that's more convenient to take.  The gas and bloating-recommend a trial of probiotic.

## 2016-12-27 ENCOUNTER — Other Ambulatory Visit: Payer: Self-pay | Admitting: Family Medicine

## 2016-12-29 ENCOUNTER — Telehealth: Payer: Self-pay | Admitting: *Deleted

## 2016-12-29 NOTE — Telephone Encounter (Signed)
Pre Authorization sent to cover my meds. [id number]

## 2016-12-30 NOTE — Telephone Encounter (Signed)
Qvar has been approved. From 06/072018-06/30/2017. Message left with pharm

## 2017-01-04 ENCOUNTER — Encounter: Payer: Self-pay | Admitting: Physician Assistant

## 2017-01-04 ENCOUNTER — Ambulatory Visit (INDEPENDENT_AMBULATORY_CARE_PROVIDER_SITE_OTHER): Payer: BC Managed Care – PPO | Admitting: Physician Assistant

## 2017-01-04 VITALS — BP 127/71 | HR 80 | Ht 64.25 in | Wt 142.0 lb

## 2017-01-04 DIAGNOSIS — R05 Cough: Secondary | ICD-10-CM | POA: Diagnosis not present

## 2017-01-04 DIAGNOSIS — K5900 Constipation, unspecified: Secondary | ICD-10-CM

## 2017-01-04 DIAGNOSIS — J019 Acute sinusitis, unspecified: Secondary | ICD-10-CM

## 2017-01-04 DIAGNOSIS — J4521 Mild intermittent asthma with (acute) exacerbation: Secondary | ICD-10-CM | POA: Diagnosis not present

## 2017-01-04 DIAGNOSIS — R059 Cough, unspecified: Secondary | ICD-10-CM

## 2017-01-04 MED ORDER — LUBIPROSTONE 24 MCG PO CAPS: 24.0000 ug | ORAL_CAPSULE | Freq: Two times a day (BID) | ORAL | 5 refills | Status: DC

## 2017-01-04 MED ORDER — HYDROCOD POLST-CPM POLST ER 10-8 MG/5ML PO SUER: 5.0000 mL | Freq: Two times a day (BID) | ORAL | 0 refills | Status: DC | PRN

## 2017-01-04 MED ORDER — PREDNISONE 50 MG PO TABS: ORAL_TABLET | ORAL | 0 refills | Status: DC

## 2017-01-04 MED ORDER — CEFDINIR 300 MG PO CAPS: 300.0000 mg | ORAL_CAPSULE | Freq: Two times a day (BID) | ORAL | 0 refills | Status: DC

## 2017-01-04 NOTE — Progress Notes (Signed)
Subjective:    Patient ID: Tara Howard, female    DOB: 04-23-1971, 46 y.o.   MRN: [id number]  HPI  Pt is a 47 yo female who presents to the clinic with a little over a week of coughing, SOB, wheezing and chest tightness. She has asthma and on Qvar and Xopenex. She has been using Xopenex at least twice a day if not more. She does ger relief when using rescue. She is not also having sinus pressure, ear pain. She started coughing and blowing up green sputum yesterday. No fever, chills, body aches.  She has also taken dayquil with little relief.   She mentions she does not feel like amitiza is working at 69mcg bid. She wonders if she could increase. linzess was too powerful and had diarrhea and cramps. Continues to struggle to find a balance with her constipation.   .. Active Ambulatory Problems    Diagnosis Date Noted  . ALLERGIC ASTHMA 06/29/2010  . Asthma 05/21/2010  . GERD 01/13/2010  . Fibromyalgia 01/13/2010  . IRRITABLE BOWEL SYNDROME 10/01/2010  . Fatigue 10/20/2010  . Migraines 12/07/2010  . Left knee pain 03/04/2011  . Insomnia 11/30/2012  . Cervical radiculopathy 06/10/2014  . Constipation 02/04/2015  . 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 Acute maxillary sinusitis 05/28/2010  . Acute bronchitis 05/21/2010  . DYSPEPSIA 01/20/2010  . Gastritis without bleeding 10/20/2010  . Needs flu shot 03/25/2016   Past Medical History:  Diagnosis Date  . Allergy   . Asthma   . Fibromyalgia   . IBS (irritable bowel syndrome)       Review of Systems  All other systems reviewed and are negative.      Objective:   Physical Exam  Constitutional: She appears well-developed and well-nourished.  HENT:  Head: Normocephalic and atraumatic.  TM's erythematous.  Pressure to palpation over bilateral sinuses.  oropharynx erythematous without tonsil swelling or exudate.  Nasal turbinates red and swollen.   Eyes: Conjunctivae are  normal. Right eye exhibits no discharge. Left eye exhibits no discharge.  Neck: Normal range of motion. Neck supple.  Cardiovascular: Normal rate, regular rhythm and normal heart sounds.   Pulmonary/Chest:  No rhonchi.  Expiratory wheezing heard both lungs at apex more than base.   Lymphadenopathy:    She has cervical adenopathy.          Assessment & Plan:  Marland KitchenMarland KitchenDiagnoses and all orders for this visit:  Mild intermittent asthma with exacerbation -     predniSONE (DELTASONE) 50 MG tablet; Take one tablet for 5 days.  Constipation, unspecified constipation type -     lubiprostone (AMITIZA) 24 MCG capsule; Take 1 capsule (24 mcg total) by mouth 2 (two) times daily with a meal.  Cough -     chlorpheniramine-HYDROcodone (TUSSIONEX) 10-8 MG/5ML SUER; Take 5 mLs by mouth every 12 (twelve) hours as needed for cough (cough, will cause drowsiness.).  Acute rhinosinusitis -     cefdinir (OMNICEF) 300 MG capsule; Take 1 capsule (300 mg total) by mouth 2 (two) times daily. For 10 days.   For asthma treated with prednisone. Continue xopenex and Qvar daily.  For sinus infection treated with omnicef due to PCN and Azithromycin intolerances.  For cough tussinonex given. Hx of getting coughing syrup for this type of exacerbation to help her sleep. [place] controlled substance database reviewed with no concerns.  For constipation increased amitiza to 6mcg bid. Follow up with PCP for management.

## 2017-01-06 ENCOUNTER — Other Ambulatory Visit: Payer: Self-pay | Admitting: Family Medicine

## 2017-01-20 ENCOUNTER — Telehealth: Payer: Self-pay

## 2017-01-20 DIAGNOSIS — J4521 Mild intermittent asthma with (acute) exacerbation: Secondary | ICD-10-CM

## 2017-01-20 MED ORDER — PREDNISONE 50 MG PO TABS: ORAL_TABLET | ORAL | 0 refills | Status: DC

## 2017-01-20 NOTE — Telephone Encounter (Signed)
Medication sent. Patient advised.

## 2017-01-20 NOTE — Telephone Encounter (Signed)
Tara Howard states while she was taking the prednisone her cough was better. When she finished it returned as bad as before. She was hoping to have a different or additional treatment. Please advise.

## 2017-01-20 NOTE — Telephone Encounter (Signed)
Ok to refill the prednisone 

## 2017-01-23 ENCOUNTER — Encounter: Payer: Self-pay | Admitting: Family Medicine

## 2017-01-23 ENCOUNTER — Ambulatory Visit (INDEPENDENT_AMBULATORY_CARE_PROVIDER_SITE_OTHER): Payer: BC Managed Care – PPO | Admitting: Family Medicine

## 2017-01-23 VITALS — BP 121/70 | HR 72 | Resp 18 | Wt 145.0 lb

## 2017-01-23 DIAGNOSIS — K2100 Gastro-esophageal reflux disease with esophagitis, without bleeding: Secondary | ICD-10-CM

## 2017-01-23 DIAGNOSIS — J4521 Mild intermittent asthma with (acute) exacerbation: Secondary | ICD-10-CM | POA: Diagnosis not present

## 2017-01-23 DIAGNOSIS — R059 Cough, unspecified: Secondary | ICD-10-CM

## 2017-01-23 DIAGNOSIS — R05 Cough: Secondary | ICD-10-CM

## 2017-01-23 MED ORDER — HYDROCOD POLST-CPM POLST ER 10-8 MG/5ML PO SUER: 5.0000 mL | Freq: Two times a day (BID) | ORAL | 0 refills | Status: DC | PRN

## 2017-01-23 MED ORDER — BECLOMETHASONE DIPROP HFA 40 MCG/ACT IN AERB: 2.0000 | INHALATION_SPRAY | Freq: Two times a day (BID) | RESPIRATORY_TRACT | 3 refills | Status: DC

## 2017-01-23 NOTE — Progress Notes (Signed)
   Subjective:    Patient ID: Tara Howard, female    DOB: 1971/03/26, 46 y.o.   MRN: [id number]  HPI 46 year old female came in on June 13 for an asthma exacerbation. She was placed on prednisone and given Tussionex as well as Ceftin year for rhinosinusitis. She comes in today for persistent cough. We did refill her prednisone over the weekend because she did feel like it helped initially. She feels like she constantly is clearing her throat. She describes her cough as dry. She says that will come in fits and then she may have a couple of hours where she doesn't cough at all. She does have a history of GERD and is currently on ranitidine. She still has some occasional breakthrough symptoms but usually is dietary. She's also using her Qnasl. She's not using any type of saline rinse. The prednisone does seem to help her symptoms and she would like a refill on her cough medication.  Asthma-she really doesn't like the ready inhaler version of Qvar. She really feels like it's just not getting into her lungs one rather go back to a regular MDI inhaler.   Review of Systems     Objective:   Physical Exam  Constitutional: She is oriented to person, place, and time. She appears well-developed and well-nourished.  HENT:  Head: Normocephalic and atraumatic.  Right Ear: External ear normal.  Left Ear: External ear normal.  Nose: Nose normal.  Mouth/Throat: Oropharynx is clear and moist.  TMs and canals are clear.   Eyes: Conjunctivae and EOM are normal. Pupils are equal, round, and reactive to light.  Neck: Neck supple. No thyromegaly present.  Cardiovascular: Normal rate, regular rhythm and normal heart sounds.   Pulmonary/Chest: Effort normal and breath sounds normal. She has no wheezes.  Lymphadenopathy:    She has no cervical adenopathy.  Neurological: She is alert and oriented to person, place, and time.  Skin: Skin is warm and dry.  Psychiatric: She has a normal mood and affect.           Assessment & Plan:  Cough-expanded could be related to increase in GERD, I do think her sinus infection has been cleared. Could also be related to her asthma. Could also be related to postnasal drip. Recommend use of nasal saline to clean out the sinuses once or twice a day before using the Qnasl. Also she's having some breakthrough reflux symptoms encourage her to switch to her Nexium for a short period of time, for example one week. Did refill the cough medication.  Asthma-complete course of prednisone. We'll switch from the Qvar ready inhaler back to the regular MDI. New prescription sent to pharmacy. Call if any problems.  GERD-switch back to PPI for a short period of time maybe 1 or 2 weeks and then can go back to the H2 blocker if needed.

## 2017-01-23 NOTE — Patient Instructions (Signed)
Call if not better at the end of the week Use the nasal saline rinse once or twice a day before your Qnasl.  Try your nexium for one week.

## 2017-01-30 ENCOUNTER — Telehealth: Payer: Self-pay | Admitting: *Deleted

## 2017-01-30 DIAGNOSIS — R05 Cough: Secondary | ICD-10-CM

## 2017-01-30 DIAGNOSIS — R053 Chronic cough: Secondary | ICD-10-CM

## 2017-01-30 DIAGNOSIS — J329 Chronic sinusitis, unspecified: Secondary | ICD-10-CM

## 2017-01-30 NOTE — Telephone Encounter (Signed)
OK, I would recommend referal to ENT for further evaluation.  Could also consider limited sinus CT to make sure sinus infection has cleared.

## 2017-01-30 NOTE — Telephone Encounter (Signed)
Patient called and states she was told to call back if her cough didn't go away. Please advise

## 2017-01-31 NOTE — Telephone Encounter (Signed)
Message left on vm.referral to ENT placed

## 2017-02-06 ENCOUNTER — Other Ambulatory Visit: Payer: Self-pay | Admitting: Family Medicine

## 2017-02-09 ENCOUNTER — Telehealth: Payer: Self-pay

## 2017-02-09 MED ORDER — PREGABALIN 50 MG PO CAPS: 50.0000 mg | ORAL_CAPSULE | Freq: Two times a day (BID) | ORAL | 1 refills | Status: DC

## 2017-02-09 NOTE — Telephone Encounter (Signed)
OK, new Rx sent. F/U in 6-8 weeks.

## 2017-02-09 NOTE — Telephone Encounter (Signed)
Pt left VM stating she would like to give Lyrica a try for her fibromyalgia. Please advise.

## 2017-02-09 NOTE — Telephone Encounter (Signed)
Pt informed and asked if she should stop any medications. Asked Dr. Madilyn Fireman and pt was told to stop taking gabapentin.

## 2017-02-27 ENCOUNTER — Other Ambulatory Visit: Payer: Self-pay

## 2017-02-27 MED ORDER — PREGABALIN 150 MG PO CAPS: 150.0000 mg | ORAL_CAPSULE | Freq: Two times a day (BID) | ORAL | 3 refills | Status: DC

## 2017-02-27 NOTE — Telephone Encounter (Signed)
OK, will fax over new Rx.

## 2017-02-27 NOTE — Telephone Encounter (Signed)
Patient called stated that her pharmacist told her that she could increase her Lyrica 50 mg. She stated that she started out taking 50 mg for the first 2-3 days and then she increased it to 100 mg in the morning 50 mg in the evenings for 1 week to now 100 mg in the morning and 100 mg in the evening. She stated that dose regimen is working ok but she is completely out. She wants to know if she can get  A Rx for Lyrica 150 mg instead. Please advise. Luverne Zerkle,CMA

## 2017-02-28 NOTE — Telephone Encounter (Signed)
Left detailed message on patient vm advising her of this. Shadee Rathod,CMA

## 2017-03-14 ENCOUNTER — Ambulatory Visit (INDEPENDENT_AMBULATORY_CARE_PROVIDER_SITE_OTHER): Payer: BC Managed Care – PPO | Admitting: Family Medicine

## 2017-03-14 ENCOUNTER — Encounter: Payer: Self-pay | Admitting: Family Medicine

## 2017-03-14 VITALS — BP 118/62 | HR 89 | Temp 98.2°F | Wt 146.0 lb

## 2017-03-14 DIAGNOSIS — T63461A Toxic effect of venom of wasps, accidental (unintentional), initial encounter: Secondary | ICD-10-CM | POA: Diagnosis not present

## 2017-03-14 DIAGNOSIS — Z23 Encounter for immunization: Secondary | ICD-10-CM

## 2017-03-14 MED ORDER — BETAMETHASONE DIPROPIONATE 0.05 % EX CREA: TOPICAL_CREAM | Freq: Two times a day (BID) | CUTANEOUS | 0 refills | Status: DC

## 2017-03-14 NOTE — Progress Notes (Signed)
   Subjective:    Patient ID: Tara Howard, female    DOB: August 11, 1970, 46 y.o.   MRN: [id number]  HPI She was stung on posterior right calf about 10 days ago. It is still red and swollen and itching. She keeps bumping it on things.  It still swollen and she can fill a knot in the center. There still a ring of erythema but has been gradually getting a little bit smaller. She's not been applying anything over-the-counter.  Review of Systems     Objective:   Physical Exam  Constitutional: She is oriented to person, place, and time. She appears well-developed and well-nourished.  HENT:  Head: Normocephalic and atraumatic.  Eyes: Conjunctivae and EOM are normal.  Cardiovascular: Normal rate.   Pulmonary/Chest: Effort normal.  Neurological: She is alert and oriented to person, place, and time.  Skin: Skin is dry. No pallor.  On the right posterior calf there is an erythematous nodule to little scab in the center and a ring of erythema proximal to me 2 cm around the edge of the nodule. No open wound or drainage.  Psychiatric: She has a normal mood and affect. Her behavior is normal.  Vitals reviewed.      Assessment & Plan:  Wasp sting - will tx with topical steroid. If not better in one week and please let me know. Right now no sign of cellulitis or infection.

## 2017-03-18 ENCOUNTER — Other Ambulatory Visit: Payer: Self-pay | Admitting: Family Medicine

## 2017-04-19 ENCOUNTER — Encounter: Payer: Self-pay | Admitting: Physician Assistant

## 2017-04-19 ENCOUNTER — Ambulatory Visit (INDEPENDENT_AMBULATORY_CARE_PROVIDER_SITE_OTHER): Payer: BC Managed Care – PPO | Admitting: Physician Assistant

## 2017-04-19 VITALS — BP 105/70 | HR 84 | Temp 98.2°F | Wt 145.0 lb

## 2017-04-19 DIAGNOSIS — R059 Cough, unspecified: Secondary | ICD-10-CM

## 2017-04-19 DIAGNOSIS — J018 Other acute sinusitis: Secondary | ICD-10-CM

## 2017-04-19 DIAGNOSIS — R05 Cough: Secondary | ICD-10-CM

## 2017-04-19 MED ORDER — HYDROCOD POLST-CPM POLST ER 10-8 MG/5ML PO SUER: 5.0000 mL | Freq: Every evening | ORAL | 0 refills | Status: DC | PRN

## 2017-04-19 MED ORDER — CEFUROXIME AXETIL 250 MG PO TABS: 250.0000 mg | ORAL_TABLET | Freq: Two times a day (BID) | ORAL | 0 refills | Status: AC

## 2017-04-19 NOTE — Patient Instructions (Addendum)
-   continue albuterol 1-2 puffs every 4 hours as needed - return for worsening shortness of breath, wheezing - can increase albuterol to 4 puffs every hour if you feel like you are having an asthma attack

## 2017-04-19 NOTE — Progress Notes (Signed)
HPI:                                                                Tara Howard is a 46 y.o. female who presents to [facility] today for cough and congestion  Patient with PMH of mild intermittent asthma presents today with cough x 2 weeks. Associated symptoms include nasal congestion, ear congestion, sinus pressure, chest tightness and shortness of breath. Taking Claritin-D and Mucinex. Has been using Albuterol inhaler 2 times daily. Reports she feels she is not improving. Denies fever, wheezing, hemoptysis, nausea, abdominal pain.  Past Medical History:  Diagnosis Date  . 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 Asthma   . Fibromyalgia   . IBS (irritable bowel syndrome)    Past Surgical History:  Procedure Laterality Date  . CESAREAN SECTION     X 2  . ENDOMETRIAL ABLATION    . TONSILLECTOMY    . TUBAL LIGATION     Social History  Substance Use Topics  . Smoking status: Never Smoker  . Smokeless tobacco: Never Used  . Alcohol use No   family history includes CAD in her father and mother; Depression in her mother; Heart attack in her mother; Hypertension in her mother.  ROS: negative except as noted in the HPI  Medications: Current Outpatient Prescriptions  Medication Sig Dispense Refill  . beclomethasone (QVAR) 40 MCG/ACT inhaler Inhale 2 puffs into the lungs 2 (two) times daily. 10.6 g 3  . cetirizine (ZYRTEC) 10 MG tablet Take 10 mg by mouth daily.    Marland Kitchen FLUoxetine (PROZAC) 40 MG capsule Take 1 capsule (40 mg total) by mouth daily. 90 capsule 3  . levalbuterol (XOPENEX HFA) 45 MCG/ACT inhaler Inhale 2 puffs into the lungs every 6 (six) hours as needed for wheezing or shortness of breath. 1 Inhaler 2  . polyethylene glycol powder (GLYCOLAX/MIRALAX) powder 1capful mixed with 8 ounces of water BID prn. 850 g prn  . pregabalin (LYRICA) 150 MG capsule Take 1 capsule (150 mg total) by mouth 2 (two) times daily. 60 capsule 3  . QNASL  80 MCG/ACT AERS PLACE 2 SPRAYS INTO BOTH NOSTRILS DAILY 8.7 g 4  . ranitidine (ZANTAC) 150 MG tablet TAKE ONE TABLET BY MOUTH 2 TIMES A DAY 180 tablet 3  . traZODone (DESYREL) 100 MG tablet TAKE 1 & 1/2 TABLETS BY MOUTH AT BEDTIME 45 tablet 3  . cefUROXime (CEFTIN) 250 MG tablet Take 1 tablet (250 mg total) by mouth 2 (two) times daily with a meal. 14 tablet 0  . chlorpheniramine-HYDROcodone (TUSSIONEX) 10-8 MG/5ML SUER Take 5 mLs by mouth at bedtime as needed for cough (cough, will cause drowsiness.). 120 mL 0   No current facility-administered medications for this visit.    Allergies  Allergen Reactions  . Azithromycin Other (See Comments) and Nausea And Vomiting    Nausea and diarrhea   . Augmentin [Amoxicillin-Pot Clavulanate] Other (See Comments)    Stomach cramps, diarrhea and vomiting  . Flexeril [Cyclobenzaprine] Other (See Comments)    constipation  . Zolpidem Tartrate Other (See Comments)    Hallucinations       Objective:  BP 105/70   Pulse 84   Temp 98.2 F (36.8 C) (Oral)  Wt 145 lb (65.8 kg)   BMI 24.70 kg/m  Gen: well-groomed, cooperative, not ill-appearing, no distress HEENT: normal conjunctiva, TM's clear, nasal mucosa edematous, oropharynx clear, moist mucus membranes, no frontal or maxillary sinus tenderness, neck supple, trachea midline Pulm: Normal work of breathing, normal phonation, clear to auscultation bilaterally, no wheezes, rales or rhonchi, coughing in the exam room CV: Normal rate, regular rhythm, s1 and s2 distinct, no murmurs, clicks or rubs  Neuro: alert and oriented x 3, no tremor MSK: extremities atraumatic, normal gait and station, no peripheral edema Lymph: there is cervical adenopathy Skin: warm, dry, intact; no rashes on exposed skin, no cyanosis   No results found for this or any previous visit (from the past 72 hour(s)). No results found.    Assessment and Plan: 46 y.o. female with   1. Acute non-recurrent sinusitis of other  sinus - cefUROXime (CEFTIN) 250 MG tablet; Take 1 tablet (250 mg total) by mouth 2 (two) times daily with a meal.  Dispense: 14 tablet; Refill: 0  2. Cough in adult - SpO2 99%, no wheezes on exam, I do not think this is an asthma exacerbation - continue albuterol 1-2 puffs every 4 hours as needed - return for worsening shortness of breath, wheezing - can increase albuterol to 4 puffs every hour for asthma attack - chlorpheniramine-HYDROcodone (TUSSIONEX) 10-8 MG/5ML SUER; Take 5 mLs by mouth at bedtime as needed for cough (cough, will cause drowsiness.).  Dispense: 120 mL; Refill: 0   Patient education and anticipatory guidance given Patient agrees with treatment plan Follow-up as needed if symptoms worsen or fail to improve  Darlyne Russian PA-C

## 2017-05-17 ENCOUNTER — Ambulatory Visit (INDEPENDENT_AMBULATORY_CARE_PROVIDER_SITE_OTHER): Payer: BC Managed Care – PPO | Admitting: Family Medicine

## 2017-05-17 ENCOUNTER — Encounter: Payer: Self-pay | Admitting: Family Medicine

## 2017-05-17 VITALS — BP 112/67 | HR 94 | Ht 64.0 in | Wt 147.0 lb

## 2017-05-17 DIAGNOSIS — K2100 Gastro-esophageal reflux disease with esophagitis, without bleeding: Secondary | ICD-10-CM

## 2017-05-17 DIAGNOSIS — J4530 Mild persistent asthma, uncomplicated: Secondary | ICD-10-CM

## 2017-05-17 DIAGNOSIS — F5101 Primary insomnia: Secondary | ICD-10-CM | POA: Diagnosis not present

## 2017-05-17 DIAGNOSIS — J309 Allergic rhinitis, unspecified: Secondary | ICD-10-CM

## 2017-05-17 DIAGNOSIS — M797 Fibromyalgia: Secondary | ICD-10-CM

## 2017-05-17 MED ORDER — TRAZODONE HCL 100 MG PO TABS: ORAL_TABLET | ORAL | 3 refills | Status: DC

## 2017-05-17 MED ORDER — LEVALBUTEROL TARTRATE 45 MCG/ACT IN AERO: 2.0000 | INHALATION_SPRAY | Freq: Four times a day (QID) | RESPIRATORY_TRACT | 2 refills | Status: DC | PRN

## 2017-05-17 MED ORDER — PREGABALIN 200 MG PO CAPS: 200.0000 mg | ORAL_CAPSULE | Freq: Two times a day (BID) | ORAL | 1 refills | Status: DC

## 2017-05-17 NOTE — Progress Notes (Signed)
Subjective:    CC: asthma  HPI:  F/U Asthma - when I last saw her she didn't like the redihaler for the Qvar.  Unfortunately they do not neck make the old version so she is still been using the regular redihaler.  She has rarely had to use her albuterol lately which is great.  F/U GERD -currently on ranitidine twice a day.  Will be due for refills next month.  F/U fibromyalgia - she is doing ok but still experiencing a lot of tension in her shoulders. Hasn't been able to get to massage bc of time limits.    Past medical history, Surgical history, Family history not pertinant except as noted below, Social history, Allergies, and medications have been entered into the medical record, reviewed, and corrections made.   Review of Systems: No fevers, chills, night sweats, weight loss, chest pain, or shortness of breath.   Objective:    General: Well Developed, well nourished, and in no acute distress.  Neuro: Alert and oriented x3, extra-ocular muscles intact, sensation grossly intact.  HEENT: Normocephalic, atraumatic  Skin: Warm and dry, no rashes. Cardiac: Regular rate and rhythm, no murmurs rubs or gallops, no lower extremity edema.  Respiratory: Clear to auscultation bilaterally. Not using accessory muscles, speaking in full sentences.   Impression and Recommendations:    Asthma -, mild intermittent-overall doing really well.  She has not had to use her rescue inhaler in several weeks.  But she does need a refill because it expires this month.  Allergic rhinitis-we will try switching to generic Flonase since her Qnasl is not being covered by her insurance.  She currently uses 1 spray in each nostril.  GERD -get a refill for 1 year once due next month.  Fibromyalgia -we discussed options.  We will try increasing her Lyrica to 200 prescription and sent to pharmacy.  Follow-up in 3-4 months.  Also discussed using her heat wrap more often to try to relieve that tension particularly at  the end of the day.  Trying to maybe schedule massage of time and money are available to do so since this does seem to be helpful for her.

## 2017-05-17 NOTE — Patient Instructions (Signed)
[facility] Massage by [employer] [address], [place], [place] [address] Check out [employer]

## 2017-06-02 ENCOUNTER — Other Ambulatory Visit: Payer: Self-pay | Admitting: Family Medicine

## 2017-06-07 ENCOUNTER — Telehealth: Payer: Self-pay

## 2017-06-07 NOTE — Telephone Encounter (Signed)
Qnasl is not covered by insurance. Patient would like a different medication. Please advise.

## 2017-06-08 NOTE — Telephone Encounter (Signed)
We will have to try either Flonase or Nasonex.  Please see if she has a preference.

## 2017-06-12 NOTE — Telephone Encounter (Signed)
Left VM to see which one Pt prefers.

## 2017-08-28 ENCOUNTER — Encounter: Payer: BC Managed Care – PPO | Admitting: Sports Medicine

## 2017-08-28 DIAGNOSIS — Z0189 Encounter for other specified special examinations: Secondary | ICD-10-CM

## 2017-08-29 ENCOUNTER — Encounter: Payer: Self-pay | Admitting: Sports Medicine

## 2017-08-29 ENCOUNTER — Ambulatory Visit (INDEPENDENT_AMBULATORY_CARE_PROVIDER_SITE_OTHER): Payer: BC Managed Care – PPO

## 2017-08-29 ENCOUNTER — Ambulatory Visit: Payer: BC Managed Care – PPO | Admitting: Sports Medicine

## 2017-08-29 DIAGNOSIS — M25572 Pain in left ankle and joints of left foot: Secondary | ICD-10-CM

## 2017-08-29 DIAGNOSIS — J0101 Acute recurrent maxillary sinusitis: Secondary | ICD-10-CM | POA: Diagnosis not present

## 2017-08-29 MED ORDER — CLARITHROMYCIN 500 MG PO TABS
500.0000 mg | ORAL_TABLET | Freq: Two times a day (BID) | ORAL | 0 refills | Status: DC
Start: 2017-08-29 — End: 2017-10-09

## 2017-08-29 NOTE — Assessment & Plan Note (Signed)
Adding Biaxin, she will increase Flonase to 1 spray twice a day each nostril.

## 2017-08-29 NOTE — Progress Notes (Signed)
Subjective:    I'm seeing this patient as a consultation for: Dr. Beatrice Lecher  CC: Left ankle pain  HPI: Around Christmas this pleasant 47 year old female was roughhousing with her husband, she ended up falling, onto a outstretched left leg, her ankle was forced into severe plantar flexion, she had immediate pain, without much swelling on the posterior ankle joint, and inability to immediately bear weight.  She did not seek medical attention, had a mild improvement over the next couple of weeks but then the pain plateau works at now.  Moderate, persistent, localized to the posterior tibiotalar joint without radiation, worse with palpation at the posterior joint line.  Sinus infection: Symptoms now for several weeks, failed initial course of antibiotics, pain is over the frontal and maxillary sinuses, moderate, persistent, radiation to the ears and teeth.  No constitutional symptoms, shortness of breath.  I reviewed the past medical history, family history, social history, surgical history, and allergies today and no changes were needed.  Please see the problem list section below in epic for further details.  Past Medical History: Past Medical History:  Diagnosis Date  . 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 Asthma   . Fibromyalgia   . IBS (irritable bowel syndrome)    Past Surgical History: Past Surgical History:  Procedure Laterality Date  . CESAREAN SECTION     X 2  . ENDOMETRIAL ABLATION    . TONSILLECTOMY    . TUBAL LIGATION     Social History: Social History   Socioeconomic History  . Marital status: Married    Spouse name: None  . Number of children: None  . Years of education: None  . Highest education level: None  Social Needs  . Financial resource strain: None  . Food insecurity - worry: None  . Food insecurity - inability: None  . Transportation needs - medical: None  . Transportation needs - non-medical: None  Occupational History  . None  Tobacco Use  .  Smoking status: Never Smoker  . Smokeless tobacco: Never Used  Substance and Sexual Activity  . Alcohol use: No  . Drug use: None  . Sexual activity: Yes  Other Topics Concern  . None  Social History Narrative  . None   Family History: Family History  Problem Relation Age of Onset  . CAD Father        3 vessel bypass  . Depression Mother   . Hypertension Mother   . Heart attack Mother   . CAD Mother        3 vessel bypass   Allergies: Allergies  Allergen Reactions  . Azithromycin Other (See Comments) and Nausea And Vomiting    Nausea and diarrhea   . Augmentin [Amoxicillin-Pot Clavulanate] Other (See Comments)    Stomach cramps, diarrhea and vomiting  . Flexeril [Cyclobenzaprine] Other (See Comments)    constipation  . Zolpidem Tartrate Other (See Comments)    Hallucinations   Medications: See med rec.  Review of Systems: No headache, visual changes, nausea, vomiting, diarrhea, constipation, dizziness, abdominal pain, skin rash, fevers, chills, night sweats, weight loss, swollen lymph nodes, body aches, joint swelling, muscle aches, chest pain, shortness of breath, mood changes, visual or auditory hallucinations.   Objective:   General: Well Developed, well nourished, and in no acute distress.  Neuro:  Extra-ocular muscles intact, able to move all 4 extremities, sensation grossly intact.  Deep tendon reflexes tested were normal. Psych: Alert and oriented, mood congruent with affect.  ENT:  Ears and nose appear unremarkable.  Hearing grossly normal.  Oropharynx, nasopharynx, ear canals unremarkable, minimal tenderness over the frontal and maxillary sinuses. Neck: Unremarkable overall appearance, trachea midline.  No visible thyroid enlargement. Eyes: Conjunctivae and lids appear unremarkable.  Pupils equal and round. Skin: Warm and dry, no rashes noted.  Cardiovascular: Pulses palpable, no extremity edema. Left ankle: No visible erythema or swelling. Range of motion is  full in all directions. Strength is 5/5 in all directions. Stable lateral and medial ligaments; squeeze test and kleiger test unremarkable; Tender to palpation of the posterior aspect of the talus, also reproduction of pain with terminal plantar flexion of the ankle itself. No pain at base of 5th MT; No tenderness over cuboid; No tenderness over N spot or navicular prominence No tenderness on posterior aspects of lateral and medial malleolus No sign of peroneal tendon subluxations; Negative tarsal tunnel tinel's Able to walk 4 steps.  Impression and Recommendations:   This case required medical decision making of moderate complexity.  Left ankle pain Pain for several weeks now, suspect posterior malleolar or calcaneal, or posterior talar fracture. X-rays, Cam boot for now. Minimize weightbearing. Return to see me in 2 weeks, if persistent pain we will proceed with an MRI.  Acute recurrent maxillary sinusitis Adding Biaxin, she will increase Flonase to 1 spray twice a day each nostril.  ___________________________________________ Gwen Her. Dianah Field, M.D., ABFM., CAQSM. Primary Care and [place] Instructor of [employer] [facility] [employer]

## 2017-08-29 NOTE — Assessment & Plan Note (Signed)
Pain for several weeks now, suspect posterior malleolar or calcaneal, or posterior talar fracture. X-rays, Cam boot for now. Minimize weightbearing. Return to see me in 2 weeks, if persistent pain we will proceed with an MRI.

## 2017-09-11 ENCOUNTER — Encounter: Payer: Self-pay | Admitting: Sports Medicine

## 2017-09-11 ENCOUNTER — Ambulatory Visit: Payer: BC Managed Care – PPO | Admitting: Sports Medicine

## 2017-09-11 ENCOUNTER — Ambulatory Visit: Payer: BC Managed Care – PPO | Admitting: Family Medicine

## 2017-09-11 DIAGNOSIS — M25572 Pain in left ankle and joints of left foot: Secondary | ICD-10-CM | POA: Diagnosis not present

## 2017-09-11 MED ORDER — AMBULATORY NON FORMULARY MEDICATION
0 refills | Status: DC
Start: 2017-09-11 — End: 2018-01-15

## 2017-09-11 NOTE — Assessment & Plan Note (Signed)
Continued suspect posterior malleolar, calcaneal or posterior talar injury, talar osteochondral defect is also in the differential. X-rays were negative. She has good strength without pain to resisted flexion of the great toe, small toes, ankle.  No pain with resisted eversion, inversion, dorsiflexion of the great toe or the small toes.  This essentially rules out injury to the flexor, extensor hallucis longus, flexor, extensor digitorum longus, peroneals and tibialis posterior. Slightly better with Cam boot immobilization for 2 weeks but continuing to have significant pain. We discussed MRI but we are going to simply treat this as if there is an occult fracture for cost savings. A cam boot for 4 more weeks.   If persistent pain we will proceed with MRI.

## 2017-09-11 NOTE — Progress Notes (Signed)
Subjective:    CC: Recheck ankle  HPI: 2-3 weeks ago this pleasant 47 year old female had had a hyper plantar flexion injury to her left ankle while roughhousing with her husband.  She had immediate pain and inability to bear weight.  X-rays at that time were negative, we placed her in a boot, she returns today with good improvement in pain but still with some persistent symptoms.  I reviewed the past medical history, family history, social history, surgical history, and allergies today and no changes were needed.  Please see the problem list section below in epic for further details.  Past Medical History: Past Medical History:  Diagnosis Date  . 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 Asthma   . Fibromyalgia   . IBS (irritable bowel syndrome)    Past Surgical History: Past Surgical History:  Procedure Laterality Date  . CESAREAN SECTION     X 2  . ENDOMETRIAL ABLATION    . TONSILLECTOMY    . TUBAL LIGATION     Social History: Social History   Socioeconomic History  . Marital status: Married    Spouse name: None  . Number of children: None  . Years of education: None  . Highest education level: None  Social Needs  . Financial resource strain: None  . Food insecurity - worry: None  . Food insecurity - inability: None  . Transportation needs - medical: None  . Transportation needs - non-medical: None  Occupational History  . None  Tobacco Use  . Smoking status: Never Smoker  . Smokeless tobacco: Never Used  Substance and Sexual Activity  . Alcohol use: No  . Drug use: None  . Sexual activity: Yes  Other Topics Concern  . None  Social History Narrative  . None   Family History: Family History  Problem Relation Age of Onset  . CAD Father        3 vessel bypass  . Depression Mother   . Hypertension Mother   . Heart attack Mother   . CAD Mother        3 vessel bypass   Allergies: Allergies  Allergen Reactions  . Azithromycin Other (See Comments) and Nausea And  Vomiting    Nausea and diarrhea   . Augmentin [Amoxicillin-Pot Clavulanate] Other (See Comments)    Stomach cramps, diarrhea and vomiting  . Flexeril [Cyclobenzaprine] Other (See Comments)    constipation  . Zolpidem Tartrate Other (See Comments)    Hallucinations   Medications: See med rec.  Review of Systems: No fevers, chills, night sweats, weight loss, chest pain, or shortness of breath.   Objective:    General: Well Developed, well nourished, and in no acute distress.  Neuro: Alert and oriented x3, extra-ocular muscles intact, sensation grossly intact.  HEENT: Normocephalic, atraumatic, pupils equal round reactive to light, neck supple, no masses, no lymphadenopathy, thyroid nonpalpable.  Skin: Warm and dry, no rashes. Cardiac: Regular rate and rhythm, no murmurs rubs or gallops, no lower extremity edema.  Respiratory: Clear to auscultation bilaterally. Not using accessory muscles, speaking in full sentences. Left ankle: No visible erythema or swelling. Range of motion is full in all directions. Strength is 5/5 in all directions. Stable lateral and medial ligaments; squeeze test and kleiger test unremarkable; Tender to palpation over the posterior tibiotalar joint, reproduction of pain with terminal plantar flexion, her pain is quite severe with this maneuver. No pain at base of 5th MT; No tenderness over cuboid; No tenderness over N  spot or navicular prominence No tenderness on posterior aspects of lateral and medial malleolus No sign of peroneal tendon subluxations; Negative tarsal tunnel tinel's Able to walk 4 steps. She has good strength without pain to resisted flexion of the great toe, small toes, ankle.  No pain with resisted eversion, inversion, dorsiflexion of the great toe or the small toes.  This essentially rules out injury to the flexor, extensor hallucis longus, flexor, extensor digitorum longus, peroneals and tibialis posterior.  Impression and Recommendations:     Left ankle pain Continued suspect posterior malleolar, calcaneal or posterior talar injury, talar osteochondral defect is also in the differential. X-rays were negative. She has good strength without pain to resisted flexion of the great toe, small toes, ankle.  No pain with resisted eversion, inversion, dorsiflexion of the great toe or the small toes.  This essentially rules out injury to the flexor, extensor hallucis longus, flexor, extensor digitorum longus, peroneals and tibialis posterior. Slightly better with Cam boot immobilization for 2 weeks but continuing to have significant pain. We discussed MRI but we are going to simply treat this as if there is an occult fracture for cost savings. A cam boot for 4 more weeks.   If persistent pain we will proceed with MRI.    ___________________________________________ Gwen Her. Dianah Field, M.D., ABFM., CAQSM. Primary Care and [place] Instructor of [place] [facility] of Medicine

## 2017-10-09 ENCOUNTER — Ambulatory Visit: Payer: BC Managed Care – PPO | Admitting: Sports Medicine

## 2017-10-09 ENCOUNTER — Encounter: Payer: Self-pay | Admitting: Sports Medicine

## 2017-10-09 DIAGNOSIS — J0101 Acute recurrent maxillary sinusitis: Secondary | ICD-10-CM | POA: Diagnosis not present

## 2017-10-09 DIAGNOSIS — M25572 Pain in left ankle and joints of left foot: Secondary | ICD-10-CM | POA: Diagnosis not present

## 2017-10-09 MED ORDER — AMOXICILLIN-POT CLAVULANATE 875-125 MG PO TABS: 1.0000 | ORAL_TABLET | Freq: Two times a day (BID) | ORAL | 0 refills | Status: AC

## 2017-10-09 NOTE — Assessment & Plan Note (Signed)
All ankle pain has resolved with 6 weeks of boot immobilization. I do suspect she had a posterior talar injury, able to jump up and down on the affected extremity without pain. Discontinue boot, return as needed.

## 2017-10-09 NOTE — Assessment & Plan Note (Addendum)
Recurrent maxillary sinusitis. Flonase, Augmentin. She does get some diarrhea with Augmentin but no true allergy. Follow-up with PCP.

## 2017-10-09 NOTE — Progress Notes (Signed)
Subjective:    CC: Follow-up  HPI: Ankle pain: Completely resolved after 6 weeks of boot immobilization.  Sore throat: With sinus pain and pressure, radiation to both ears, moderate, persistent, present for a week and a half, has had a history of sinus infections and these feel similar.  Symptoms are moderate, persistent.  Minimal cough, no shortness of breath, no GI symptoms or constitutional symptoms.  I reviewed the past medical history, family history, social history, surgical history, and allergies today and no changes were needed.  Please see the problem list section below in epic for further details.  Past Medical History: Past Medical History:  Diagnosis Date  . 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 Asthma   . Fibromyalgia   . IBS (irritable bowel syndrome)    Past Surgical History: Past Surgical History:  Procedure Laterality Date  . CESAREAN SECTION     X 2  . ENDOMETRIAL ABLATION    . TONSILLECTOMY    . TUBAL LIGATION     Social History: Social History   Socioeconomic History  . Marital status: Married    Spouse name: None  . Number of children: None  . Years of education: None  . Highest education level: None  Social Needs  . Financial resource strain: None  . Food insecurity - worry: None  . Food insecurity - inability: None  . Transportation needs - medical: None  . Transportation needs - non-medical: None  Occupational History  . None  Tobacco Use  . Smoking status: Never Smoker  . Smokeless tobacco: Never Used  Substance and Sexual Activity  . Alcohol use: No  . Drug use: None  . Sexual activity: Yes  Other Topics Concern  . None  Social History Narrative  . None   Family History: Family History  Problem Relation Age of Onset  . CAD Father        3 vessel bypass  . Depression Mother   . Hypertension Mother   . Heart attack Mother   . CAD Mother        3 vessel bypass   Allergies: Allergies  Allergen Reactions  . Azithromycin Other (See  Comments) and Nausea And Vomiting    Nausea and diarrhea   . Augmentin [Amoxicillin-Pot Clavulanate] Other (See Comments)    Stomach cramps, diarrhea and vomiting  . Flexeril [Cyclobenzaprine] Other (See Comments)    constipation  . Zolpidem Tartrate Other (See Comments)    Hallucinations   Medications: See med rec.  Review of Systems: No fevers, chills, night sweats, weight loss, chest pain, or shortness of breath.   Objective:    General: Well Developed, well nourished, and in no acute distress.  Neuro: Alert and oriented x3, extra-ocular muscles intact, sensation grossly intact.  HEENT: Normocephalic, atraumatic, pupils equal round reactive to light, neck supple, no masses, no lymphadenopathy, thyroid nonpalpable.  Mild tenderness over the maxillary sinuses, oropharynx is slightly erythematous, nasopharynx, ear canals unremarkable. Skin: Warm and dry, no rashes. Cardiac: Regular rate and rhythm, no murmurs rubs or gallops, no lower extremity edema.  Respiratory: Clear to auscultation bilaterally. Not using accessory muscles, speaking in full sentences. Left ankle: No visible erythema or swelling. Range of motion is full in all directions. Strength is 5/5 in all directions. Stable lateral and medial ligaments; squeeze test and kleiger test unremarkable; Talar dome nontender; No pain at base of 5th MT; No tenderness over cuboid; No tenderness over N spot or navicular prominence No tenderness on  posterior aspects of lateral and medial malleolus No sign of peroneal tendon subluxations; Negative tarsal tunnel tinel's Able to jump up and down on the affected extremity  Impression and Recommendations:    Acute recurrent maxillary sinusitis Recurrent maxillary sinusitis. Flonase, Augmentin. She does get some diarrhea with Augmentin but no true allergy. Follow-up with PCP.  Left ankle pain All ankle pain has resolved with 6 weeks of boot immobilization. I do suspect she had a  posterior talar injury, able to jump up and down on the affected extremity without pain. Discontinue boot, return as needed. ___________________________________________ Gwen Her. Dianah Field, M.D., ABFM., CAQSM. Primary Care and [place] Instructor of [place] [facility] of Medicine

## 2017-11-09 ENCOUNTER — Other Ambulatory Visit: Payer: Self-pay | Admitting: Family Medicine

## 2017-11-09 DIAGNOSIS — M797 Fibromyalgia: Secondary | ICD-10-CM

## 2017-12-12 ENCOUNTER — Other Ambulatory Visit: Payer: Self-pay | Admitting: Family Medicine

## 2018-01-15 ENCOUNTER — Ambulatory Visit: Payer: BC Managed Care – PPO | Admitting: Family Medicine

## 2018-01-15 ENCOUNTER — Encounter: Payer: Self-pay | Admitting: Family Medicine

## 2018-01-15 VITALS — BP 97/63 | HR 92 | Ht 64.0 in | Wt 149.0 lb

## 2018-01-15 DIAGNOSIS — J4530 Mild persistent asthma, uncomplicated: Secondary | ICD-10-CM | POA: Diagnosis not present

## 2018-01-15 DIAGNOSIS — M797 Fibromyalgia: Secondary | ICD-10-CM | POA: Diagnosis not present

## 2018-01-15 DIAGNOSIS — F5101 Primary insomnia: Secondary | ICD-10-CM | POA: Diagnosis not present

## 2018-01-15 DIAGNOSIS — K581 Irritable bowel syndrome with constipation: Secondary | ICD-10-CM | POA: Diagnosis not present

## 2018-01-15 MED ORDER — BECLOMETHASONE DIPROP HFA 40 MCG/ACT IN AERB: INHALATION_SPRAY | RESPIRATORY_TRACT | 3 refills | Status: DC

## 2018-01-15 MED ORDER — FLUOXETINE HCL 40 MG PO CAPS: 40.0000 mg | ORAL_CAPSULE | Freq: Every day | ORAL | 1 refills | Status: DC

## 2018-01-15 MED ORDER — TRAZODONE HCL 100 MG PO TABS: ORAL_TABLET | ORAL | 3 refills | Status: DC

## 2018-01-15 MED ORDER — PREGABALIN 200 MG PO CAPS
200.0000 mg | ORAL_CAPSULE | Freq: Two times a day (BID) | ORAL | 1 refills | Status: DC
Start: 2018-01-15 — End: 2018-08-09

## 2018-01-15 MED ORDER — PREGABALIN 200 MG PO CAPS
200.0000 mg | ORAL_CAPSULE | Freq: Two times a day (BID) | ORAL | 1 refills | Status: DC
Start: 2018-01-15 — End: 2018-01-15

## 2018-01-15 NOTE — Progress Notes (Signed)
Subjective:    CC: 6 mo f/u for asthma  HPI: 55-month follow-up for asthma-when the spring hit she did have to use her albuterol little more frequently for a couple of days.  But in the last 2 weeks she is only had to use it once during the daytime.  She does use her Qvar regularly.  Follow-up fibromyalgia-when I last saw her we increased her Lyrica prescription.  She is also having a lot of tension in her shoulders and had recommended a trial of massage therapy.  I know that can be price limiting at times.  She does feel like the increase on the Lyrica has been helpful and is happy with that regimen.  IBS -she is actually been having more diarrhea instead of constipation recently.  Just seems to continually flip-flop and can never seem to get some consistency.  She takes her MiraLAX once or twice a daily.  And about once a month she will take Dulcolax.  She says she just feels like the stool is not moving out properly.  She does eat a high-fiber diet.  She has tried Linzess in the past but it caused severe cramping and too much diarrhea.  Is also tried IBgard and that does help with cramping but does not really help with moving her bowels.  Chronic insomnia-due for refill on trazodone.   Past medical history, Surgical history, Family history not pertinant except as noted below, Social history, Allergies, and medications have been entered into the medical record, reviewed, and corrections made.   Review of Systems: No fevers, chills, night sweats, weight loss, chest pain, or shortness of breath.   Objective:    General: Well Developed, well nourished, and in no acute distress.  Neuro: Alert and oriented x3, extra-ocular muscles intact, sensation grossly intact.  HEENT: Normocephalic, atraumatic  Skin: Warm and dry, no rashes. Cardiac: Regular rate and rhythm, no murmurs rubs or gallops, no lower extremity edema.  Respiratory: Clear to auscultation bilaterally. Not using accessory muscles,  speaking in full sentences.   Impression and Recommendations:   Asthma -mild intermittent-continue with as needed albuterol and daily Qvar.  She did have some flare through the spring but feels like she is under better control now.  Fibromyalgia -doing well on 200 mg Lyrica.  Refills sent today.  IBS -commended trial of aloe juice or magnesium citrate to get her bowels going.  We could also try different medication since the Linzess was not helpful we can pick something that is approved for irritable bowel syndrome constipation predominant.  Insomnia - F/U trazodone.

## 2018-01-15 NOTE — Patient Instructions (Signed)
Please schedule your mammogram this summer. Can try aloe juice or magnesium citrate to help move your bowels.

## 2018-03-16 ENCOUNTER — Encounter: Payer: Self-pay | Admitting: Family Medicine

## 2018-03-16 ENCOUNTER — Ambulatory Visit (INDEPENDENT_AMBULATORY_CARE_PROVIDER_SITE_OTHER): Payer: BC Managed Care – PPO | Admitting: Family Medicine

## 2018-03-16 DIAGNOSIS — Z23 Encounter for immunization: Secondary | ICD-10-CM | POA: Diagnosis not present

## 2018-03-16 NOTE — Progress Notes (Signed)
Pt here for flu shot. Afebrile,no recent illness. Vaccination given, pt tolerated well..[name], CMA  

## 2018-05-11 ENCOUNTER — Other Ambulatory Visit: Payer: Self-pay | Admitting: Family Medicine

## 2018-05-11 NOTE — Telephone Encounter (Signed)
Needs follow up in December

## 2018-06-01 ENCOUNTER — Encounter: Payer: Self-pay | Admitting: Family Medicine

## 2018-06-01 ENCOUNTER — Ambulatory Visit (INDEPENDENT_AMBULATORY_CARE_PROVIDER_SITE_OTHER): Payer: BC Managed Care – PPO | Admitting: Family Medicine

## 2018-06-01 VITALS — BP 112/71 | HR 82 | Ht 63.78 in | Wt 156.0 lb

## 2018-06-01 DIAGNOSIS — Z0000 Encounter for general adult medical examination without abnormal findings: Secondary | ICD-10-CM | POA: Diagnosis not present

## 2018-06-01 MED ORDER — BUTALBITAL-APAP-CAFFEINE 50-325-40 MG PO TABS: 1.0000 | ORAL_TABLET | Freq: Four times a day (QID) | ORAL | 1 refills | Status: DC | PRN

## 2018-06-01 MED ORDER — BECLOMETHASONE DIPROP HFA 40 MCG/ACT IN AERB: INHALATION_SPRAY | RESPIRATORY_TRACT | 3 refills | Status: DC

## 2018-06-01 NOTE — Progress Notes (Addendum)
Subjective:     Tara Howard is a 47 y.o. female and is here for a comprehensive physical exam. The patient reports no problems.  Social History   Socioeconomic History  . Marital status: Married    Spouse name: Not on file  . Number of children: Not on file  . Years of education: Not on file  . Highest education level: Not on file  Occupational History  . Not on file  Social Needs  . Financial resource strain: Not on file  . Food insecurity:    Worry: Not on file    Inability: Not on file  . Transportation needs:    Medical: Not on file    Non-medical: Not on file  Tobacco Use  . Smoking status: Never Smoker  . Smokeless tobacco: Never Used  Substance and Sexual Activity  . Alcohol use: No  . Drug use: Not on file  . Sexual activity: Yes  Lifestyle  . Physical activity:    Days per week: Not on file    Minutes per session: Not on file  . Stress: Not on file  Relationships  . Social connections:    Talks on phone: Not on file    Gets together: Not on file    Attends religious service: Not on file    Active member of club or organization: Not on file    Attends meetings of clubs or organizations: Not on file    Relationship status: Not on file  . Intimate partner violence:    Fear of current or ex partner: Not on file    Emotionally abused: Not on file    Physically abused: Not on file    Forced sexual activity: Not on file  Other Topics Concern  . Not on file  Social History Narrative  . Not on file   Health Maintenance  Topic Date Due  . HIV Screening  07/24/2029 (Originally 10/31/1985)  . TETANUS/TDAP  07/28/2021  . PAP SMEAR  08/29/2021  . INFLUENZA VACCINE  Completed    The following portions of the patient's history were reviewed and updated as appropriate: allergies, current medications, past family history, past medical history, past social history, past surgical history and problem list.  Review of Systems A comprehensive review of systems was  negative.   Objective:    BP 112/71   Pulse 82   Ht 5' 3.78" (1.62 m)   Wt 156 lb (70.8 kg)   SpO2 100%   BMI 26.96 kg/m  General appearance: alert, cooperative and appears stated age Head: Normocephalic, without obvious abnormality, atraumatic Eyes: conj clear, EOMI, PEERLA Ears: normal TM's and external ear canals both ears Nose: Nares normal. Septum midline. Mucosa normal. No drainage or sinus tenderness. Throat: lips, mucosa, and tongue normal; teeth and gums normal Neck: no adenopathy, no carotid bruit, no JVD, supple, symmetrical, trachea midline and thyroid not enlarged, symmetric, no tenderness/mass/nodules Back: symmetric, no curvature. ROM normal. No CVA tenderness. Lungs: clear to auscultation bilaterally Breasts: normal appearance, no masses or tenderness Heart: regular rate and rhythm, S1, S2 normal, no murmur, click, rub or gallop Abdomen: soft, non-tender; bowel sounds normal; no masses,  no organomegaly Extremities: extremities normal, atraumatic, no cyanosis or edema Pulses: 2+ and symmetric Skin: Skin color, texture, turgor normal. No rashes or lesions Lymph nodes: Cervical, supraclavicular, and axillary nodes normal. Neurologic: Alert and oriented X 3, normal strength and tone. Normal symmetric reflexes. Normal coordination and gait    Assessment:    Healthy  female exam.      Plan:     See After Visit Summary for Counseling Recommendations   Keep up a regular exercise program and make sure you are eating a healthy diet Try to eat 4 servings of dairy a day, or if you are lactose intolerant take a calcium with vitamin D daily.  Your vaccines are up to date.   Her mom passed away in 06-04-23. WE discussed EAP through work and Hospice or her church for counseling.     Tension headaches-she reports she has been having more frequent headaches.  She says the Tylenol does not always seem to help and would like to try something new.  New prescription sent for  Fioricet.

## 2018-06-01 NOTE — Patient Instructions (Signed)

## 2018-06-01 NOTE — Addendum Note (Signed)
Addended by: Beatrice Lecher D on: 06/01/2018 05:15 PM   Modules accepted: Orders

## 2018-06-04 ENCOUNTER — Other Ambulatory Visit: Payer: Self-pay | Admitting: Family Medicine

## 2018-06-04 NOTE — Telephone Encounter (Signed)
Pt called to see if Provider can send something other than Fioricet in for her. States she cannot tolerate caffeine well, and it causes cyst in her breast. Also states she does not want any form of muscle relaxer.   Routing.

## 2018-06-05 NOTE — Telephone Encounter (Signed)
Would she be willing to try a triptan like Relpax or Imitrex?

## 2018-06-06 MED ORDER — ELETRIPTAN HYDROBROMIDE 20 MG PO TABS: 20.0000 mg | ORAL_TABLET | ORAL | 3 refills | Status: DC | PRN

## 2018-06-06 NOTE — Telephone Encounter (Signed)
Spoke w/pt and she is ok with trying either of these.Tara Howard, [place]

## 2018-06-14 ENCOUNTER — Encounter: Payer: BC Managed Care – PPO | Admitting: Family Medicine

## 2018-07-09 ENCOUNTER — Ambulatory Visit (INDEPENDENT_AMBULATORY_CARE_PROVIDER_SITE_OTHER): Payer: BC Managed Care – PPO | Admitting: Family Medicine

## 2018-07-09 ENCOUNTER — Encounter: Payer: Self-pay | Admitting: Family Medicine

## 2018-07-09 VITALS — BP 111/73 | HR 103 | Ht 64.0 in | Wt 156.0 lb

## 2018-07-09 DIAGNOSIS — J4541 Moderate persistent asthma with (acute) exacerbation: Secondary | ICD-10-CM | POA: Diagnosis not present

## 2018-07-09 MED ORDER — PREDNISONE 20 MG PO TABS: 40.0000 mg | ORAL_TABLET | Freq: Every day | ORAL | 0 refills | Status: DC

## 2018-07-09 MED ORDER — HYDROCODONE-HOMATROPINE 5-1.5 MG/5ML PO SYRP: 5.0000 mL | ORAL_SOLUTION | Freq: Every evening | ORAL | 0 refills | Status: DC | PRN

## 2018-07-09 MED ORDER — LEVALBUTEROL TARTRATE 45 MCG/ACT IN AERO: 2.0000 | INHALATION_SPRAY | Freq: Four times a day (QID) | RESPIRATORY_TRACT | 1 refills | Status: DC | PRN

## 2018-07-09 MED ORDER — FLUTICASONE-SALMETEROL 250-50 MCG/DOSE IN AEPB: 1.0000 | INHALATION_SPRAY | Freq: Two times a day (BID) | RESPIRATORY_TRACT | 2 refills | Status: DC

## 2018-07-09 MED ORDER — DOXYCYCLINE HYCLATE 100 MG PO TABS: 100.0000 mg | ORAL_TABLET | Freq: Two times a day (BID) | ORAL | 0 refills | Status: DC

## 2018-07-09 NOTE — Progress Notes (Signed)
Acute Office Visit  Subjective:    Patient ID: Tara Howard, female    DOB: April 25, 1971, 47 y.o.   MRN: [id number]  Chief Complaint  Patient presents with  . Asthma    sxs x 3 wks  . Cough    green mucous taking mucinex  . Bronchitis    denies f/s/c she feels like it is in deep in her throat and in her chest    HPI Patient is in today for Asthma. Cough and SOB x 3 weeks.  Says it really started more with a flare of her asthma she was getting shortness of breath and cough.  She started her Qvar around that time so she is been using consistently for about 3 weeks.  But over the last week it has gradually turned into more of an infection.  She now has a sore throat with persistent cough.  She feels worse.  Though she denies any fevers chills or sweats.  She is had significant voice raspiness for about 3 to 4 days.  He now has started using her Xopenex at least a couple of times a day.  Past Medical History:  Diagnosis Date  . 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 Asthma   . Fibromyalgia   . IBS (irritable bowel syndrome)     Past Surgical History:  Procedure Laterality Date  . CESAREAN SECTION     X 2  . ENDOMETRIAL ABLATION    . TONSILLECTOMY    . TUBAL LIGATION      Family History  Problem Relation Age of Onset  . CAD Father        3 vessel bypass  . Depression Mother   . Hypertension Mother   . Heart attack Mother   . CAD Mother        3 vessel bypass    Social History   Socioeconomic History  . Marital status: Married    Spouse name: Not on file  . Number of children: Not on file  . Years of education: Not on file  . Highest education level: Not on file  Occupational History  . Not on file  Social Needs  . Financial resource strain: Not on file  . Food insecurity:    Worry: Not on file    Inability: Not on file  . Transportation needs:    Medical: Not on file    Non-medical: Not on file  Tobacco Use  . Smoking status: Never Smoker  . Smokeless tobacco:  Never Used  Substance and Sexual Activity  . Alcohol use: No  . Drug use: Not on file  . Sexual activity: Yes  Lifestyle  . Physical activity:    Days per week: Not on file    Minutes per session: Not on file  . Stress: Not on file  Relationships  . Social connections:    Talks on phone: Not on file    Gets together: Not on file    Attends religious service: Not on file    Active member of club or organization: Not on file    Attends meetings of clubs or organizations: Not on file    Relationship status: Not on file  . Intimate partner violence:    Fear of current or ex partner: Not on file    Emotionally abused: Not on file    Physically abused: Not on file    Forced sexual activity: Not on file  Other Topics Concern  . Not  on file  Social History Narrative  . Not on file    Outpatient Medications Prior to Visit  Medication Sig Dispense Refill  . beclomethasone (QVAR REDIHALER) 40 MCG/ACT inhaler INHALE 2 PUFFS INTO THE LUNGS 2 TIMES A DAY 10.6 g 3  . cetirizine (ZYRTEC) 10 MG tablet Take 10 mg by mouth daily.    Marland Kitchen FLUoxetine (PROZAC) 40 MG capsule Take 1 capsule (40 mg total) by mouth daily. 90 capsule 0  . pregabalin (LYRICA) 200 MG capsule Take 1 capsule (200 mg total) by mouth 2 (two) times daily. 180 capsule 1  . traZODone (DESYREL) 100 MG tablet TAKE 1 & 1/2 TABLETS BY MOUTH AT BEDTIME 135 tablet 3  . levalbuterol (XOPENEX HFA) 45 MCG/ACT inhaler Inhale 2 puffs into the lungs every 6 (six) hours as needed for wheezing or shortness of breath.    . eletriptan (RELPAX) 20 MG tablet Take 1 tablet (20 mg total) by mouth as needed for migraine or headache. May repeat in 2 hours if headache persists or recurs. 10 tablet 3   No facility-administered medications prior to visit.     Allergies  Allergen Reactions  . Azithromycin Other (See Comments) and Nausea And Vomiting    Nausea and diarrhea   . Augmentin [Amoxicillin-Pot Clavulanate] Other (See Comments)    Stomach  cramps, diarrhea and vomiting  . Flexeril [Cyclobenzaprine] Other (See Comments)    constipation  . Zolpidem Tartrate Other (See Comments)    Hallucinations    ROS     Objective:    Physical Exam  Constitutional: She is oriented to person, place, and time. She appears well-developed and well-nourished.  HENT:  Head: Normocephalic and atraumatic.  Right Ear: External ear normal.  Left Ear: External ear normal.  Nose: Nose normal.  Mouth/Throat: Oropharynx is clear and moist.  TMs and canals are clear.   Eyes: Pupils are equal, round, and reactive to light. Conjunctivae and EOM are normal.  Neck: Neck supple. No thyromegaly present.  Cardiovascular: Normal rate, regular rhythm and normal heart sounds.  Pulmonary/Chest: Effort normal and breath sounds normal. She has no wheezes.  Lymphadenopathy:    She has no cervical adenopathy.  Neurological: She is alert and oriented to person, place, and time.  Skin: Skin is warm and dry.  Psychiatric: She has a normal mood and affect.    BP 111/73   Pulse (!) 103   Ht 5\' 4"  (1.626 m)   Wt 156 lb (70.8 kg)   SpO2 99%   BMI 26.78 kg/m  Wt Readings from Last 3 Encounters:  07/09/18 156 lb (70.8 kg)  06/01/18 156 lb (70.8 kg)  01/15/18 149 lb (67.6 kg)    There are no preventive care reminders to display for this patient.  There are no preventive care reminders to display for this patient.   Lab Results  Component Value Date   TSH 1.235 10/09/2014   Lab Results  Component Value Date   WBC 5.3 10/09/2014   HGB 13.8 10/09/2014   HCT 41.7 10/09/2014   MCV 94.1 10/09/2014   PLT 249 10/09/2014   Lab Results  Component Value Date   NA 140 08/08/2016   NA 140 08/08/2016   K 4.3 08/08/2016   K 4.3 08/08/2016   CO2 27 10/09/2014   GLUCOSE 84 10/09/2014   BUN 12 08/08/2016   BUN 18 08/08/2016   CREATININE 0.7 08/08/2016   CREATININE 0.7 08/08/2016   BILITOT 0.5 10/09/2014   ALKPHOS 51 08/08/2016  AST 15 08/08/2016    AST 15 08/08/2016   ALT 13 08/08/2016   ALT 13 08/08/2016   PROT 6.4 10/09/2014   ALBUMIN 4.3 10/09/2014   CALCIUM 9.2 10/09/2014   Lab Results  Component Value Date   CHOL 146 08/08/2016   CHOL 146 08/08/2016   Lab Results  Component Value Date   HDL 48 08/08/2016   HDL 48 08/08/2016   Lab Results  Component Value Date   LDLCALC 85 08/08/2016   [place] 85 08/08/2016   Lab Results  Component Value Date   TRIG 65 08/08/2016   TRIG 65 08/08/2016   No results found for: CHOLHDL No results found for: HGBA1C     Assessment & Plan:   Problem List Items Addressed This Visit    None    Visit Diagnoses    Moderate persistent asthma with exacerbation    -  Primary   Relevant Medications   predniSONE (DELTASONE) 20 MG tablet   Fluticasone-Salmeterol (ADVAIR) 250-50 MCG/DOSE AEPB   levalbuterol (XOPENEX HFA) 45 MCG/ACT inhaler     Asthma exacerbation-we will treat with prednisone, doxycycline, and will increase her Qvar to a combination medication.  Prescription sent for generic Advair.  She is Artie been using the Qvar for almost 3 weeks.  Meds ordered this encounter  Medications  . doxycycline (VIBRA-TABS) 100 MG tablet    Sig: Take 1 tablet (100 mg total) by mouth 2 (two) times daily.    Dispense:  20 tablet    Refill:  0  . predniSONE (DELTASONE) 20 MG tablet    Sig: Take 2 tablets (40 mg total) by mouth daily with breakfast.    Dispense:  10 tablet    Refill:  0  . Fluticasone-Salmeterol (ADVAIR) 250-50 MCG/DOSE AEPB    Sig: Inhale 1 puff into the lungs 2 (two) times daily.    Dispense:  60 each    Refill:  2  . levalbuterol (XOPENEX HFA) 45 MCG/ACT inhaler    Sig: Inhale 2 puffs into the lungs every 6 (six) hours as needed for wheezing or shortness of breath. Pt will call when needs to filled.    Dispense:  1 Inhaler    Refill:  1  . DISCONTD: HYDROcodone-homatropine (HYCODAN) 5-1.5 MG/5ML syrup    Sig: Take 5 mLs by mouth at bedtime as needed for cough.     Dispense:  120 mL    Refill:  0  . HYDROcodone-homatropine (HYCODAN) 5-1.5 MG/5ML syrup    Sig: Take 5 mLs by mouth at bedtime as needed for cough.    Dispense:  120 mL    Refill:  0     Beatrice Lecher, MD

## 2018-07-11 ENCOUNTER — Telehealth: Payer: Self-pay

## 2018-07-11 NOTE — Telephone Encounter (Signed)
It should cover for sinus infection. If not better after the weekend then please let me know.

## 2018-07-11 NOTE — Telephone Encounter (Signed)
Tara Howard states she now has a sinus infection and wanted to know if the doxycycline will cover the sinus infection. Please advise.

## 2018-07-11 NOTE — Telephone Encounter (Signed)
Patient advised of recommendations.  

## 2018-08-02 ENCOUNTER — Other Ambulatory Visit: Payer: Self-pay | Admitting: Family Medicine

## 2018-08-06 ENCOUNTER — Other Ambulatory Visit: Payer: Self-pay | Admitting: Family Medicine

## 2018-08-06 DIAGNOSIS — Z1231 Encounter for screening mammogram for malignant neoplasm of breast: Secondary | ICD-10-CM

## 2018-08-09 ENCOUNTER — Other Ambulatory Visit: Payer: Self-pay | Admitting: Family Medicine

## 2018-08-09 DIAGNOSIS — M797 Fibromyalgia: Secondary | ICD-10-CM

## 2018-08-16 ENCOUNTER — Ambulatory Visit (INDEPENDENT_AMBULATORY_CARE_PROVIDER_SITE_OTHER): Payer: BC Managed Care – PPO

## 2018-08-16 DIAGNOSIS — R928 Other abnormal and inconclusive findings on diagnostic imaging of breast: Secondary | ICD-10-CM | POA: Diagnosis not present

## 2018-08-16 DIAGNOSIS — Z1231 Encounter for screening mammogram for malignant neoplasm of breast: Secondary | ICD-10-CM

## 2018-08-17 ENCOUNTER — Other Ambulatory Visit: Payer: Self-pay | Admitting: Family Medicine

## 2018-08-17 DIAGNOSIS — R928 Other abnormal and inconclusive findings on diagnostic imaging of breast: Secondary | ICD-10-CM

## 2018-08-22 ENCOUNTER — Other Ambulatory Visit: Payer: Self-pay | Admitting: Family Medicine

## 2018-08-22 ENCOUNTER — Ambulatory Visit
Admission: RE | Admit: 2018-08-22 | Discharge: 2018-08-22 | Disposition: A | Discharge status: Home / Self Care | Payer: BC Managed Care – PPO | Source: Ambulatory Visit | Attending: Family Medicine | Admitting: Family Medicine

## 2018-08-22 DIAGNOSIS — R928 Other abnormal and inconclusive findings on diagnostic imaging of breast: Secondary | ICD-10-CM

## 2018-08-22 DIAGNOSIS — N6320 Unspecified lump in the left breast, unspecified quadrant: Secondary | ICD-10-CM

## 2018-08-27 ENCOUNTER — Ambulatory Visit
Admission: RE | Admit: 2018-08-27 | Discharge: 2018-08-27 | Disposition: A | Discharge status: Home / Self Care | Payer: BC Managed Care – PPO | Source: Ambulatory Visit | Attending: Family Medicine | Admitting: Family Medicine

## 2018-08-27 DIAGNOSIS — N6320 Unspecified lump in the left breast, unspecified quadrant: Secondary | ICD-10-CM

## 2018-08-28 ENCOUNTER — Other Ambulatory Visit: Payer: Self-pay | Admitting: Family Medicine

## 2018-08-28 ENCOUNTER — Other Ambulatory Visit: Payer: Self-pay

## 2018-08-28 ENCOUNTER — Telehealth: Payer: Self-pay

## 2018-08-28 DIAGNOSIS — N6320 Unspecified lump in the left breast, unspecified quadrant: Secondary | ICD-10-CM

## 2018-08-28 DIAGNOSIS — C50912 Malignant neoplasm of unspecified site of left female breast: Secondary | ICD-10-CM

## 2018-08-28 NOTE — Telephone Encounter (Signed)
[facility] called and states the biopsy resulted as invasive ductal carcinoma, left, ductal carcinoma in situ. The patient wants Dr Madilyn Fireman to refer her to a surgeon in [facility]. Patient is very upset.

## 2018-08-29 ENCOUNTER — Other Ambulatory Visit: Payer: Self-pay | Admitting: Family Medicine

## 2018-08-29 DIAGNOSIS — C50919 Malignant neoplasm of unspecified site of unspecified female breast: Secondary | ICD-10-CM

## 2018-08-30 ENCOUNTER — Ambulatory Visit
Admission: RE | Admit: 2018-08-30 | Discharge: 2018-08-30 | Disposition: A | Discharge status: Home / Self Care | Payer: BC Managed Care – PPO | Source: Ambulatory Visit | Attending: Family Medicine | Admitting: Family Medicine

## 2018-08-30 DIAGNOSIS — N6320 Unspecified lump in the left breast, unspecified quadrant: Secondary | ICD-10-CM

## 2018-08-31 ENCOUNTER — Telehealth: Payer: Self-pay

## 2018-08-31 DIAGNOSIS — Z809 Family history of malignant neoplasm, unspecified: Secondary | ICD-10-CM

## 2018-08-31 DIAGNOSIS — R197 Diarrhea, unspecified: Secondary | ICD-10-CM

## 2018-08-31 DIAGNOSIS — K5900 Constipation, unspecified: Secondary | ICD-10-CM

## 2018-08-31 NOTE — Telephone Encounter (Signed)
Referral sent. Patient advised.

## 2018-08-31 NOTE — Telephone Encounter (Signed)
OK to place GI referral to [facility].  Please find out if Pat or Mat and add to fam hx.  BC she is 47 and not 52 yet they may need to see her before doing colonocopy.  Can also use diarrhea dn constipation as dx codes and fam hx of cancer.

## 2018-08-31 NOTE — Telephone Encounter (Signed)
Tara Howard called and states her grandmother died of bowel cancer. She was wanting to get a colonoscopy. She does have a problem with diarrhea and constipation. Please advise.

## 2018-09-05 ENCOUNTER — Encounter: Payer: Self-pay | Admitting: Gastroenterology

## 2018-09-05 ENCOUNTER — Ambulatory Visit: Payer: BC Managed Care – PPO | Admitting: Gastroenterology

## 2018-09-05 VITALS — BP 122/78 | HR 88 | Ht 64.0 in | Wt 154.0 lb

## 2018-09-05 DIAGNOSIS — K219 Gastro-esophageal reflux disease without esophagitis: Secondary | ICD-10-CM

## 2018-09-05 DIAGNOSIS — R194 Change in bowel habit: Secondary | ICD-10-CM | POA: Diagnosis not present

## 2018-09-05 DIAGNOSIS — R140 Abdominal distension (gaseous): Secondary | ICD-10-CM

## 2018-09-05 DIAGNOSIS — Z800 Family history of malignant neoplasm of digestive organs: Secondary | ICD-10-CM | POA: Diagnosis not present

## 2018-09-05 DIAGNOSIS — R1084 Generalized abdominal pain: Secondary | ICD-10-CM

## 2018-09-05 DIAGNOSIS — R197 Diarrhea, unspecified: Secondary | ICD-10-CM

## 2018-09-05 MED ORDER — SOD PICOSULFATE-MAG OX-CIT ACD 10-3.5-12 MG-GM -GM/160ML PO SOLN: 1.0000 | ORAL | 0 refills | Status: DC

## 2018-09-05 NOTE — Patient Instructions (Signed)
If you are age 46 or older, your body mass index should be between 23-30. Your Body mass index is 26.43 kg/m. If this is out of the aforementioned range listed, please consider follow up with your Primary Care Provider.  If you are age 40 or younger, your body mass index should be between 19-25. Your Body mass index is 26.43 kg/m. If this is out of the aformentioned range listed, please consider follow up with your Primary Care Provider.   You have been scheduled for an endoscopy and colonoscopy. Please follow the written instructions given to you at your visit today. Please pick up your prep supplies at the pharmacy within the next 1-3 days. If you use inhalers (even only as needed), please bring them with you on the day of your procedure. Your physician has requested that you go to www.startemmi.com and enter the access code given to you at your visit today. This web site gives a general overview about your procedure. However, you should still follow specific instructions given to you by our office regarding your preparation for the procedure.  We have sent the following medications to your pharmacy for you to pick up at your convenience:   Resume your fiber intake.  Please call our office at [phone] to set up your 3-6 month follow up visit.  It was a pleasure to see you today!  Vito Cirigliano, D.O.

## 2018-09-05 NOTE — Progress Notes (Signed)
Chief Complaint: Alternating bowel habits, Hx of IBS, GERD, abdominal pain  Referring Provider:     Hali Marry, MD  HPI:    Tara Howard is a very pleasant 48 y.o. female referred [facility] for evaluation of CRC screening along with evaluation of multiple GI symptoms, to include alternating bowel habits, abdominal pain, and reflux sxs. Family history notable for grandmother with GI malignancy (unsure of primary location) with mets. Mother with IBS.   Her GI sxs are described as: 1) Hx of IBS-C.  Index sxs were constipation predominant, which has become alternating diarrhea/constipation in the last 6 weeks or so. Of note, 6 weeks ago she started new diet of healthy eating (high in fresh fruits, vegetables, etc). Has a long hx of previously diagnosed IBS-C, which was well controlled with dietary mods, high fiber diet, and Miralax. With her new diet, had cut out fiber cereal and bran, but still taking Miralax prn, but now with non-bloody diarrhea. Intermittent generalized abdominal cramping and abdominal bloating w/ visible abdominal distension. No n/v/f/c. Previously tried [employer], and stopped due to diarrhea and abdominal pain after a few days. Otherwise, no hematochezia, melena.   2) Hx of GERD, treated with Nexium for years, daily with occassional need for 2nd dose for breakthrough sxs. Index sxs of HB and regurgitation, btu has been worsening lately with increasing breakthrough sxs. . EGD in 2012 was only n/f gastritis and esophagitis per patient. No dysphagia or odynophagia, and no early satiety.   Was seen earlier today by [facility] for newly daignosed Breast CA.   Additionally, she is quite concerned about GI malignancy given her family history and new GI symptoms.   Endoscopic Hx: - EGD (09/2010): Erythema in antrum compatible with gastritis. Otherwise normal. No actual report in EMR for review.    Past Medical  History:  Diagnosis Date  . 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 Asthma   . Fibromyalgia   . IBS (irritable bowel syndrome)      Past Surgical History:  Procedure Laterality Date  . CESAREAN SECTION     X 2  . ENDOMETRIAL ABLATION    . TONSILLECTOMY    . TUBAL LIGATION     Family History  Problem Relation Age of Onset  . CAD Father        3 vessel bypass  . Depression Mother   . Hypertension Mother   . Heart attack Mother   . CAD Mother        3 vessel bypass  . Cancer Paternal Grandmother    Social History   Tobacco Use  . Smoking status: Never Smoker  . Smokeless tobacco: Never Used  Substance Use Topics  . Alcohol use: No  . Drug use: Not on file   Current Outpatient Medications  Medication Sig Dispense Refill  . beclomethasone (QVAR REDIHALER) 40 MCG/ACT inhaler INHALE 2 PUFFS INTO THE LUNGS 2 TIMES A DAY 10.6 g 3  . cetirizine (ZYRTEC) 10 MG tablet Take 10 mg by mouth daily.    Marland Kitchen doxycycline (VIBRA-TABS) 100 MG tablet Take 1 tablet (100 mg total) by mouth 2 (two) times daily. 20 tablet 0  . FLUoxetine (PROZAC) 40 MG capsule Take 1 capsule (40 mg total) by mouth daily. 90 capsule 3  . Fluticasone-Salmeterol (ADVAIR) 250-50 MCG/DOSE AEPB Inhale 1 puff into the lungs 2 (two) times daily. 60 each  2  . HYDROcodone-homatropine (HYCODAN) 5-1.5 MG/5ML syrup Take 5 mLs by mouth at bedtime as needed for cough. 120 mL 0  . levalbuterol (XOPENEX HFA) 45 MCG/ACT inhaler Inhale 2 puffs into the lungs every 6 (six) hours as needed for wheezing or shortness of breath. Pt will call when needs to filled. 1 Inhaler 1  . predniSONE (DELTASONE) 20 MG tablet Take 2 tablets (40 mg total) by mouth daily with breakfast. 10 tablet 0  . pregabalin (LYRICA) 200 MG capsule TAKE ONE CAPSULE BY MOUTH TWICE DAILY 180 capsule 0  . traZODone (DESYREL) 100 MG tablet TAKE 1 & 1/2 TABLETS BY MOUTH AT BEDTIME 135 tablet 3   No current facility-administered medications for this visit.    Allergies    Allergen Reactions  . Azithromycin Other (See Comments) and Nausea And Vomiting    Nausea and diarrhea   . Augmentin [Amoxicillin-Pot Clavulanate] Other (See Comments)    Stomach cramps, diarrhea and vomiting  . Flexeril [Cyclobenzaprine] Other (See Comments)    constipation  . Zolpidem Tartrate Other (See Comments)    Hallucinations     Review of Systems: All systems reviewed and negative except where noted in HPI.     Physical Exam:    Wt Readings from Last 3 Encounters:  07/09/18 156 lb (70.8 kg)  06/01/18 156 lb (70.8 kg)  01/15/18 149 lb (67.6 kg)    There were no vitals taken for this visit. Constitutional:  Pleasant, in no acute distress. Psychiatric: Normal mood and affect. Behavior is normal. EENT: Pupils normal.  Conjunctivae are normal. No scleral icterus. Neck supple. No cervical LAD. Cardiovascular: Normal rate, regular rhythm. No edema Pulmonary/chest: Effort normal and breath sounds normal. No wheezing, rales or rhonchi. Abdominal: Soft, nondistended, nontender. Bowel sounds active throughout. There are no masses palpable. No hepatomegaly. Neurological: Alert and oriented to person place and time. Skin: Skin is warm and dry. No rashes noted.   ASSESSMENT AND PLAN;   Tara Howard is a 48 y.o. female presenting with:  1) Alternating bowel habits: Reported hx of IBS-C, but now with non-bloody diarrhea alternating with constipation.  - Colonoscopy and EGD with random and direct bxs to eval for malabsorption, MC, IBD, Celiac, etc - Increase dietary fiber and consider adding fiber supplement - Try to incorporate low FODMAP foods into current dietary mods  2) Hx of IBS-C: Reported long hx of IBS-C, which was well controlled with dietary mods and Miralax prn. Now with alternating bowel habits as above. If unrevealing endosopic w/u, plan to reinitiate some dietary mods, and if again with constipation, can consider trial of Motegrity or Trulance.  - Eval  for luminal/mucosal etiology at time of colonsocopy as above  3) GERD: Long standing hx of reflux sxs. Previously well controlled with Nexium daily, btu now with increasing breakthrough sxs requiring 2nd dose prn.   - EGD to eval for EE, LES laxity, hiatal hernia, etc - Resume antireflux lifestyle measures - Resume PPI for now, with plan to modify based on endoscopic findings  4) Generalized abdominal pain: 2/2 above IBS. Will eval for mucosal etiology at time of EGD/Colo as above - If unrevealing, and if sxs persists despite above interventions, can consider trial of med managament (ie, Levsin, Bentyl, etc).   5) Bloating: Eval for mcuosal etiology with EGD with bxs - If unrevealing, can consider trial of medical management   6) Family Hx of GI malignancy: MGM with GI malignancy with mets of unknown primary, causing increased concern  in patient. Can eval for malignancy at time of EGD/Colo as above.  The indications, risks, and benefits of EGD and colonoscopy were explained to the patient in detail. Risks include but are not limited to bleeding, perforation, adverse reaction to medications, and cardiopulmonary compromise. Sequelae include but are not limited to the possibility of surgery, hositalization, and mortality. The patient verbalized understanding and wished to proceed. All questions answered, referred to scheduler and bowel prep ordered. Further recommendations pending results of the exam.     Lavena Bullion, DO, FACG  09/05/2018, 1:25 PM   Hali Marry, *

## 2018-09-06 DIAGNOSIS — C50412 Malignant neoplasm of upper-outer quadrant of left female breast: Secondary | ICD-10-CM

## 2018-09-06 DIAGNOSIS — C50912 Malignant neoplasm of unspecified site of left female breast: Secondary | ICD-10-CM | POA: Insufficient documentation

## 2018-09-06 DIAGNOSIS — Z170 Estrogen receptor positive status [ER+]: Secondary | ICD-10-CM

## 2018-09-18 ENCOUNTER — Telehealth: Payer: Self-pay | Admitting: Gastroenterology

## 2018-09-18 NOTE — Telephone Encounter (Signed)
Patient called about 930 pm 2/25, no results from first dose clenpiq prep.  Has lately had difficulty with constipation.  Advised take other clenpiq dose tonight with water as instructed.   Then take a bottle of magnesium citrate at her usual 530AM prep time, 2 glasses of water to follow.  Please have [facility] nursing call patient early AM to see if results before she comes for [facility].

## 2018-09-19 ENCOUNTER — Encounter: Payer: Self-pay | Admitting: Gastroenterology

## 2018-09-19 ENCOUNTER — Ambulatory Visit (AMBULATORY_SURGERY_CENTER): Payer: BC Managed Care – PPO | Admitting: Gastroenterology

## 2018-09-19 VITALS — BP 127/73 | HR 81 | Temp 98.6°F | Resp 16 | Ht 64.0 in | Wt 154.0 lb

## 2018-09-19 DIAGNOSIS — R140 Abdominal distension (gaseous): Secondary | ICD-10-CM

## 2018-09-19 DIAGNOSIS — R197 Diarrhea, unspecified: Secondary | ICD-10-CM

## 2018-09-19 DIAGNOSIS — R12 Heartburn: Secondary | ICD-10-CM

## 2018-09-19 DIAGNOSIS — R1084 Generalized abdominal pain: Secondary | ICD-10-CM | POA: Diagnosis not present

## 2018-09-19 DIAGNOSIS — R194 Change in bowel habit: Secondary | ICD-10-CM | POA: Diagnosis present

## 2018-09-19 DIAGNOSIS — K581 Irritable bowel syndrome with constipation: Secondary | ICD-10-CM

## 2018-09-19 DIAGNOSIS — K219 Gastro-esophageal reflux disease without esophagitis: Secondary | ICD-10-CM

## 2018-09-19 DIAGNOSIS — Z800 Family history of malignant neoplasm of digestive organs: Secondary | ICD-10-CM

## 2018-09-19 MED ORDER — PLECANATIDE 3 MG PO TABS: 3.0000 mg | ORAL_TABLET | Freq: Every day | ORAL | 3 refills | Status: DC

## 2018-09-19 MED ORDER — PANTOPRAZOLE SODIUM 40 MG PO TBEC: 40.0000 mg | DELAYED_RELEASE_TABLET | Freq: Every day | ORAL | 3 refills | Status: DC

## 2018-09-19 MED ORDER — SODIUM CHLORIDE 0.9 % IV SOLN: 500.0000 mL | Freq: Once | INTRAVENOUS | Status: DC

## 2018-09-19 MED ORDER — FLEET ENEMA 7-19 GM/118ML RE ENEM
1.0000 | ENEMA | Freq: Once | RECTAL | Status: AC
  Administered 2018-09-19: 1 via RECTAL

## 2018-09-19 NOTE — Progress Notes (Signed)
A/ox3, pleased with MAC, report to RN 

## 2018-09-19 NOTE — Op Note (Signed)
[facility] Patient Name: Tara Howard Procedure Date: 09/19/2018 10:26 AM MRN: [id number] Endoscopist: Gerrit Heck , MD Age: 48 Referring MD:  Date of Birth: 04/17/1971 Gender: Female Account #: [id number] Procedure:                Upper GI endoscopy Indications:              Generalized abdominal pain, Heartburn, Suspected                            esophageal reflux, Abdominal bloating,                            Diarrhea/Change in bowel habits Medicines:                Monitored Anesthesia Care Procedure:                Pre-Anesthesia Assessment:                           - Prior to the procedure, a History and Physical                            was performed, and patient medications and                            allergies were reviewed. The patient's tolerance of                            previous anesthesia was also reviewed. The risks                            and benefits of the procedure and the sedation                            options and risks were discussed with the patient.                            All questions were answered, and informed consent                            was obtained. Prior Anticoagulants: The patient has                            taken no previous anticoagulant or antiplatelet                            agents. ASA Grade Assessment: II - A patient with                            mild systemic disease. After reviewing the risks                            and benefits, the patient was deemed in  satisfactory condition to undergo the procedure.                           After obtaining informed consent, the endoscope was                            passed under direct vision. Throughout the                            procedure, the patient's blood pressure, pulse, and                            oxygen saturations were monitored continuously. The                            Endoscope was introduced  through the mouth, and                            advanced to the second part of duodenum. The upper                            GI endoscopy was accomplished without difficulty.                            The patient tolerated the procedure well. Scope In: Scope Out: Findings:                 The examined esophagus was normal.                           The gastroesophageal flap valve was visualized                            endoscopically and classified as Hill Grade I                            (prominent fold, tight to endoscope).                           The entire examined stomach was normal. Biopsies                            were taken with a cold forceps for Helicobacter                            pylori testing. Estimated blood loss was minimal.                           The duodenal bulb, first portion of the duodenum                            and second portion of the duodenum were normal.                            Biopsies for  histology were taken with a cold                            forceps for evaluation of celiac disease. Estimated                            blood loss was minimal. Complications:            No immediate complications. Estimated Blood Loss:     Estimated blood loss was minimal. Impression:               - Normal esophagus.                           - Gastroesophageal flap valve classified as Hill                            Grade I (prominent fold, tight to endoscope).                           - Normal stomach. Biopsied.                           - Normal duodenal bulb, first portion of the                            duodenum and second portion of the duodenum.                            Biopsied. Recommendation:           - Patient has a contact number available for                            emergencies. The signs and symptoms of potential                            delayed complications were discussed with the                            patient.  Return to normal activities tomorrow.                            Written discharge instructions were provided to the                            patient.                           - Resume previous diet today.                           - Continue present medications.                           - Await pathology results.                           -  Stop Nexium and start Protonix (pantoprazole) 40                            mg PO daily for 4 weeks, then plan to reduce to the                            lowest effective dose.                           - If reflux symptoms still not well controlled, can                            consider further evaluation with Esophageal                            Manometry and pH/Impedance study (off PPI). Gerrit Heck, MD 09/19/2018 11:15:14 AM

## 2018-09-19 NOTE — Patient Instructions (Signed)
Please read handouts provided. Stop Nexium and start Protonix 40 mg daily for 4 weeks, then plan to reduce to the lowest effective dose. Return to GI office as needed. Stop Miralax and start Trulance 3 mg daily. Use fiber, for example Citrucel, Fibercon, Konsyl, or Metamucil.      YOU HAD AN ENDOSCOPIC PROCEDURE TODAY AT [facility]:   Refer to the procedure report that was given to you for any specific questions about what was found during the examination.  If the procedure report does not answer your questions, please call your gastroenterologist to clarify.  If you requested that your care partner not be given the details of your procedure findings, then the procedure report has been included in a sealed envelope for you to review at your convenience later.  YOU SHOULD EXPECT: Some feelings of bloating in the abdomen. Passage of more gas than usual.  Walking can help get rid of the air that was put into your GI tract during the procedure and reduce the bloating. If you had a lower endoscopy (such as a colonoscopy or flexible sigmoidoscopy) you may notice spotting of blood in your stool or on the toilet paper. If you underwent a bowel prep for your procedure, you may not have a normal bowel movement for a few days.  Please Note:  You might notice some irritation and congestion in your nose or some drainage.  This is from the oxygen used during your procedure.  There is no need for concern and it should clear up in a day or so.  SYMPTOMS TO REPORT IMMEDIATELY:   Following lower endoscopy (colonoscopy or flexible sigmoidoscopy):  Excessive amounts of blood in the stool  Significant tenderness or worsening of abdominal pains  Swelling of the abdomen that is new, acute  Fever of 100F or higher   Following upper endoscopy (EGD)  Vomiting of blood or coffee ground material  New chest pain or pain under the shoulder blades  Painful or persistently difficult swallowing  New  shortness of breath  Fever of 100F or higher  Black, tarry-looking stools  For urgent or emergent issues, a gastroenterologist can be reached at any hour by calling [phone].   DIET:  We do recommend a small meal at first, but then you may proceed to your regular diet.  Drink plenty of fluids but you should avoid alcoholic beverages for 24 hours.  ACTIVITY:  You should plan to take it easy for the rest of today and you should NOT DRIVE or use heavy machinery until tomorrow (because of the sedation medicines used during the test).    FOLLOW UP: Our staff will call the number listed on your records the next business day following your procedure to check on you and address any questions or concerns that you may have regarding the information given to you following your procedure. If we do not reach you, we will leave a message.  However, if you are feeling well and you are not experiencing any problems, there is no need to return our call.  We will assume that you have returned to your regular daily activities without incident.  If any biopsies were taken you will be contacted by phone or by letter within the next 1-3 weeks.  Please call us at [phone] if you have not heard about the biopsies in 3 weeks.    SIGNATURES/CONFIDENTIALITY: You and/or your care partner have signed paperwork which will be entered into your electronic medical  record.  These signatures attest to the fact that that the information above on your After Visit Summary has been reviewed and is understood.  Full responsibility of the confidentiality of this discharge information lies with you and/or your care-partner. 

## 2018-09-19 NOTE — Op Note (Signed)
[facility] Patient Name: Tara Howard Procedure Date: 09/19/2018 10:26 AM MRN: [id number] Endoscopist: Gerrit Heck , MD Age: 48 Referring MD:  Date of Birth: Nov 02, 1970 Gender: Female Account #: [id number] Procedure:                Colonoscopy Indications:              Generalized abdominal pain, Change in bowel habits,                            Chronic idiopathic constipation, Diarrhea                           48 yo female with a long standing history of IBS-C,                            currently treated with Miralax, presenting with                            recent change in bowel habits, characterized by                            non-bloody diarrhea along with generalized                            abdominal pain. No previous colonoscopy. Medicines:                Monitored Anesthesia Care Procedure:                Pre-Anesthesia Assessment:                           - Prior to the procedure, a History and Physical                            was performed, and patient medications and                            allergies were reviewed. The patient's tolerance of                            previous anesthesia was also reviewed. The risks                            and benefits of the procedure and the sedation                            options and risks were discussed with the patient.                            All questions were answered, and informed consent                            was obtained. Prior Anticoagulants: The patient has  taken no previous anticoagulant or antiplatelet                            agents. ASA Grade Assessment: II - A patient with                            mild systemic disease. After reviewing the risks                            and benefits, the patient was deemed in                            satisfactory condition to undergo the procedure.                           After obtaining informed  consent, the colonoscope                            was passed under direct vision. Throughout the                            procedure, the patient's blood pressure, pulse, and                            oxygen saturations were monitored continuously. The                            Model CF-HQ190L [id number]) scope was introduced                            through the anus and advanced to the the cecum,                            identified by appendiceal orifice and ileocecal                            valve. The colonoscopy was performed without                            difficulty. The patient tolerated the procedure                            well. The quality of the bowel preparation was                            fair. The ileocecal valve, appendiceal orifice, and                            rectum were photographed. Scope In: 10:44:20 AM Scope Out: 11:09:20 AM Scope Withdrawal Time: 0 hours 21 minutes 53 seconds  Total Procedure Duration: 0 hours 25 minutes 0 seconds  Findings:                 The perianal and digital rectal examinations were  normal.                           A moderate amount of semi-solid stool was found in                            the rectum, in the sigmoid colon, in the descending                            colon, in the ascending colon and in the cecum,                            interfering with visualization. Lavage of the area                            was performed using copious amounts of sterile                            water, resulting in clearance with fair                            visualization.                           Two sessile polyps were found in the transverse                            colon. The polyps were 1 to 2 mm in size. These                            polyps were removed with a cold biopsy forceps.                            Resection and retrieval were complete. Estimated                             blood loss was minimal.                           The visualized mucosa was otherwise normal                            appearing throughout the colon. There were no areas                            of mucosal erythema, edema, erosions, or ulceration                            noted, and no luminal narrowing or strictures.                            Biopsies for histology were taken with a cold  forceps from the right colon and left colon for                            evaluation of microscopic colitis. Estimated blood                            loss was minimal.                           The retroflexed view of the distal rectum and anal                            verge was normal and showed no anal or rectal                            abnormalities. Complications:            No immediate complications. Estimated Blood Loss:     Estimated blood loss was minimal. Impression:               - Preparation of the colon was fair.                           - Stool in the rectum, in the sigmoid colon, in the                            descending colon, in the ascending colon and in the                            cecum.                           - Two 1 to 2 mm polyps in the transverse colon,                            removed with a cold biopsy forceps. Resected and                            retrieved.                           - The visualized mucosa was otherwise normal                            appearing throughout the colon. Biopsied.                           - The distal rectum and anal verge are normal on                            retroflexion view.                           - Given the quality of the bowel preparation,  cannot rule out the presence of small or flat                            polyps on this study. Recommendation:           - Patient has a contact number available for                            emergencies. The signs  and symptoms of potential                            delayed complications were discussed with the                            patient. Return to normal activities tomorrow.                            Written discharge instructions were provided to the                            patient.                           - Resume previous diet today.                           - Continue present medications.                           - Await pathology results.                           - Repeat colonoscopy at age 33 for screening                            purposes.                           - Return to GI office PRN.                           - Use fiber, for example Citrucel, Fibercon, Konsyl                            or Metamucil.                           - Stop Miralax and start Trulance 3 mg daily for                            chronic idiopathic constipation/IBS-C. Take 1 tab                            PO daily, #90, RF3. Gerrit Heck, MD 09/19/2018 11:26:13 AM

## 2018-09-20 ENCOUNTER — Telehealth: Payer: Self-pay | Admitting: Gastroenterology

## 2018-09-20 ENCOUNTER — Telehealth: Payer: Self-pay | Admitting: *Deleted

## 2018-09-20 DIAGNOSIS — K581 Irritable bowel syndrome with constipation: Secondary | ICD-10-CM

## 2018-09-20 NOTE — Telephone Encounter (Signed)
  Follow up Call-  Call back number 09/19/2018  Post procedure Call Back phone  # [phone]  Permission to leave phone message Yes  Some recent data might be hidden     Patient questions:  Do you have a fever, pain , or abdominal swelling? No. Pain Score  0 *  Have you tolerated food without any problems? Yes.    Have you been able to return to your normal activities? Yes.    Do you have any questions about your discharge instructions: Diet   No. Medications  Yes.   Follow up visit  No.  Do you have questions or concerns about your Care? Yes.  Patient was given rx for Trulance but she needs Prior authorization. Will notify Dr. De Hollingshead nurse per patient request. SM  Actions: * If pain score is 4 or above: No action needed, pain <4.

## 2018-09-20 NOTE — Telephone Encounter (Signed)
Pt called and stated that Plecanatide (TRULANCE) 3 MG TABS [[id number]]  Was to expensive and is needing something less expensive.

## 2018-09-20 NOTE — Telephone Encounter (Signed)
[name],  Will you look into this?

## 2018-09-20 NOTE — Telephone Encounter (Signed)
I was able to get medication approved with [place] however even with the co-pay card it would be $78 a month. The patiend would like to try something less expensive. Would what you like to do next?

## 2018-09-20 NOTE — Telephone Encounter (Signed)
PA has been approved will notify the patient.

## 2018-09-20 NOTE — Telephone Encounter (Signed)
Pt called back. °

## 2018-09-21 MED ORDER — LUBIPROSTONE 8 MCG PO CAPS: 8.0000 ug | ORAL_CAPSULE | Freq: Two times a day (BID) | ORAL | 3 refills | Status: DC

## 2018-09-21 NOTE — Addendum Note (Signed)
Addended by: Herma Mering D on: 09/21/2018 08:38 AM   Modules accepted: Orders

## 2018-09-21 NOTE — Telephone Encounter (Signed)
Notified the patient of new Rx and instructions on how to take. Patient verbalized understanding of orders.

## 2018-09-21 NOTE — Telephone Encounter (Signed)
She has previously trialed Linzess, so will now try Amitiza. Start at 8 mcg BID, #120 RF3. IF needed, we can increase to 24 mcg depending on her response to therapy. Please caution her about the same diarrhea/loose stools that can be seen in the first few days as with the Linzess and Trulance, but this will abate after a few days. Thanks.

## 2018-09-23 HISTORY — PX: BREAST LUMPECTOMY: SHX2

## 2018-09-27 ENCOUNTER — Telehealth: Payer: Self-pay | Admitting: Gastroenterology

## 2018-09-27 NOTE — Telephone Encounter (Signed)
Pt states that the med pantoprazole (PROTONIX) is not working as well... would like to know if you can increase it alittle.

## 2018-09-27 NOTE — Telephone Encounter (Signed)
Spoke to patient and she agreed to give medication more time before changing Rx.

## 2018-09-28 DIAGNOSIS — Z5181 Encounter for therapeutic drug level monitoring: Secondary | ICD-10-CM | POA: Insufficient documentation

## 2018-09-28 DIAGNOSIS — Z1379 Encounter for other screening for genetic and chromosomal anomalies: Secondary | ICD-10-CM | POA: Insufficient documentation

## 2018-10-01 ENCOUNTER — Encounter: Payer: Self-pay | Admitting: Gastroenterology

## 2018-10-08 ENCOUNTER — Telehealth: Payer: Self-pay | Admitting: Gastroenterology

## 2018-10-08 NOTE — Telephone Encounter (Signed)
The pt is taking benefiber twice daily and a lot of fruit.  She is taking amitiza 8 mcg twice daily.  Has been having some improvement but has hard stool and only goes about every other day.  Has to strain.  Please advise.

## 2018-10-08 NOTE — Telephone Encounter (Signed)
Pt has questions regarding Amitiza and requested a CB.

## 2018-10-08 NOTE — Telephone Encounter (Signed)
The pt has been advised of the information and verbalized understanding.   The pt will call back with any further concerns or questions.

## 2018-10-08 NOTE — Telephone Encounter (Signed)
It is ok to add the Miralax back in to her regimen. Start with 1 cap daily, and can increase or decrease as needed to achieve soft, regular stools without straining. If that does not work, we can consider changing the Amitiza dose as well. Thanks.

## 2018-10-11 ENCOUNTER — Other Ambulatory Visit: Payer: Self-pay | Admitting: Family Medicine

## 2018-10-17 ENCOUNTER — Telehealth: Payer: Self-pay | Admitting: Gastroenterology

## 2018-10-17 DIAGNOSIS — K581 Irritable bowel syndrome with constipation: Secondary | ICD-10-CM

## 2018-10-17 MED ORDER — LUBIPROSTONE 24 MCG PO CAPS: 24.0000 ug | ORAL_CAPSULE | Freq: Two times a day (BID) | ORAL | 3 refills | Status: DC

## 2018-10-17 NOTE — Telephone Encounter (Signed)
Per Dr. Bryan Lemma okay to change Amitiza 42mcg BID to Amitiza 61mcg BID, patient verbalized understanding of order and was asked to call us if still with problems.

## 2018-10-24 DIAGNOSIS — F329 Major depressive disorder, single episode, unspecified: Secondary | ICD-10-CM | POA: Insufficient documentation

## 2018-10-24 DIAGNOSIS — F419 Anxiety disorder, unspecified: Secondary | ICD-10-CM | POA: Insufficient documentation

## 2018-10-30 ENCOUNTER — Telehealth: Payer: Self-pay | Admitting: *Deleted

## 2018-10-30 MED ORDER — PANTOPRAZOLE SODIUM 40 MG PO TBEC: 40.0000 mg | DELAYED_RELEASE_TABLET | Freq: Two times a day (BID) | ORAL | 1 refills | Status: DC

## 2018-10-30 NOTE — Telephone Encounter (Signed)
Patient states she is suppose to have Protonix twice daily instead of once daily , I see Heathers note where she was going to give the once daily protonix more time to work  Is this ok to increase to twice daily now?

## 2018-10-30 NOTE — Telephone Encounter (Signed)
Yes, we can increase to BID, with a plan to continue as BID for 8 weeks, then try to reduce back to daily with a goal to titrate to the lowest effective dose. Thank you.

## 2018-10-30 NOTE — Telephone Encounter (Signed)
Called patient to inform   And sent in script for Protonix 40 mg twice a day to take for 8 weeks

## 2018-11-01 HISTORY — PX: OTHER SURGICAL HISTORY: SHX169

## 2018-11-08 DIAGNOSIS — Z9189 Other specified personal risk factors, not elsewhere classified: Secondary | ICD-10-CM | POA: Insufficient documentation

## 2018-11-19 ENCOUNTER — Other Ambulatory Visit: Payer: Self-pay | Admitting: Family Medicine

## 2018-11-19 DIAGNOSIS — M797 Fibromyalgia: Secondary | ICD-10-CM

## 2018-11-20 NOTE — Telephone Encounter (Signed)
Pt advised and scheduled for this week.

## 2018-11-20 NOTE — Telephone Encounter (Signed)
medication refilled. Please schedule her for virtual f/u for fibromyalgia sometime this month.

## 2018-11-22 ENCOUNTER — Ambulatory Visit (INDEPENDENT_AMBULATORY_CARE_PROVIDER_SITE_OTHER): Payer: BC Managed Care – PPO | Admitting: Family Medicine

## 2018-11-22 ENCOUNTER — Encounter: Payer: Self-pay | Admitting: Family Medicine

## 2018-11-22 VITALS — Temp 98.7°F | Ht 64.0 in | Wt 150.6 lb

## 2018-11-22 DIAGNOSIS — M797 Fibromyalgia: Secondary | ICD-10-CM

## 2018-11-22 DIAGNOSIS — R5383 Other fatigue: Secondary | ICD-10-CM

## 2018-11-22 DIAGNOSIS — K219 Gastro-esophageal reflux disease without esophagitis: Secondary | ICD-10-CM

## 2018-11-22 DIAGNOSIS — R635 Abnormal weight gain: Secondary | ICD-10-CM | POA: Diagnosis not present

## 2018-11-22 DIAGNOSIS — C50412 Malignant neoplasm of upper-outer quadrant of left female breast: Secondary | ICD-10-CM

## 2018-11-22 DIAGNOSIS — F329 Major depressive disorder, single episode, unspecified: Secondary | ICD-10-CM

## 2018-11-22 DIAGNOSIS — F32A Depression, unspecified: Secondary | ICD-10-CM

## 2018-11-22 DIAGNOSIS — Z170 Estrogen receptor positive status [ER+]: Secondary | ICD-10-CM

## 2018-11-22 DIAGNOSIS — R42 Dizziness and giddiness: Secondary | ICD-10-CM

## 2018-11-22 NOTE — Progress Notes (Signed)
Virtual Visit via Video Note  I connected with Nyana Kho on 11/22/18 at  2:20 PM EDT by a video enabled telemedicine application and verified that I am speaking with the correct person using two identifiers.   I discussed the limitations of evaluation and management by telemedicine and the availability of in person appointments. The patient expressed understanding and agreed to proceed.  Pt was at home and I was in my office for the virtual visit.     Subjective:    CC: F/U Fibromyalgia   HPI: Brynn  Is a 48 yo female who I last saw in November when she came in for a physical.  She went for her screening mammogram in January and unfortunately they found some worrisome abnormalities in that left breast.  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  She had a bilateral breast mastectomy on April 9.  She is also seen GI since I last saw her for abdominal pain bloating and change in stools.  She saw Dr. Bryan Lemma.  She is currently on Amitiza for her bowels as well as Protonix.  Follow-up fibromyalgia- has been walking 2-3 miles per day. She has been doing ok. She has gaining some weight and doesn't know why.  Started after her surgery.    She has been getting dizziness since her surgery in FEb. She thought it was her pain medications but has been been of those recently. Marland Kitchen  She wonders if could be a medication side effects as she was started on Effexor, Amitiza, tamoxifen, and pantoprazole.  She was previously on Nexium but was having some persistent GERD symptoms and so was switched to pantoprazole which more recently was actually increased to twice a day dosing.  She says the Amitiza seems to be helping and working well.  She did not do well with Linzess in the past.  She has been very stressed more recently not because of the breast cancer but because her husband told her about a week ago that he was no longer in  love with her and want to separate.  He did reassure her that he would help pay off current medical bills.  They have been having some problems on and off for the last year or she felt that he was committed to working on things.  This is been very devastating for her.  Past medical history, Surgical history, Family history not pertinant except as noted below, Social history, Allergies, and medications have been entered into the medical record, reviewed, and corrections made.   Review of Systems: No fevers, chills, night sweats, weight loss, chest pain, or shortness of breath.   Objective:    General: Speaking clearly in complete sentences without any shortness of breath.  Alert and oriented x3.  Normal judgment. No apparent acute distress.    Impression and Recommendations:    Fibromyalgia-we did discuss options.  We will actually reduce her Lyrica because of recent dizziness it may be interacting with the Effexor.  We will decrease down to 150 mg twice a day.  I let her know that studies show that more than 300 mg of Lyrica per day does not typically result in any significant benefit beyond that.  Dizziness-I certainly think it could be medication related.  Could also be stress and anxiety induced.  We will try decreasing her Lyrica down to 150 mg twice a day from 200 mg twice a day  and keep the Effexor at 37.5 mg and see if this helps.  There was also a small percentage of risk with dizziness around 3% with pantoprazole and also around 3% with Amitiza.  Plan will be to follow-up with her in a couple weeks and if she still feeling dizzy then we can take a look at adjusting 1 of these medications.  GERD-Dr. Bryan Lemma had tried to get I think Dexilant approved but the co-pay was quite high.  She has pretty much reached her deductible for medication so we could always consider retrying this if we decide to discontinue the pantoprazole to see if that could be contributing to some of the dizziness she  is been experiencing.  Acute MDD - discussed working with a Social worker. PHQ- 9 core of 23, severe level of depression. Has had thought of being better off dead.  She does feel safe but it just been very overwhelming.  I think that this could be really helpful for her.  She wants to check with her insurance first to make sure that it is covered.  She already has a lot of medical debt because of her breast cancer and multiple surgeries.  Breast cancer, left-recently underwent bilateral mastectomies.  She still has expanders and and will hopefully have surgery in sometime in June or July for implants to be placed.  She is not currently undergoing any chemotherapy or radiation.  Plan will be for her to stay on tamoxifen for 5 years.   Linzess was added to medication intolerance list.   I discussed the assessment and treatment plan with the patient. The patient was provided an opportunity to ask questions and all were answered. The patient agreed with the plan and demonstrated an understanding of the instructions.   The patient was advised to call back or seek an in-person evaluation if the symptoms worsen or if the condition fails to improve as anticipated.   Beatrice Lecher, MD

## 2018-11-22 NOTE — Progress Notes (Signed)
Pt began to get very tearful when I began to ask her the PHQ questions.  Pt has some concerns about medication combinations that were given to her from her oncologist and GI that she would like to discuss.Maryruth Eve, [name], CMA

## 2018-11-23 ENCOUNTER — Telehealth: Payer: Self-pay

## 2018-11-23 DIAGNOSIS — M797 Fibromyalgia: Secondary | ICD-10-CM

## 2018-11-23 MED ORDER — DEXLANSOPRAZOLE 30 MG PO CPDR: 30.0000 mg | DELAYED_RELEASE_CAPSULE | Freq: Every day | ORAL | 11 refills | Status: DC

## 2018-11-23 NOTE — Telephone Encounter (Signed)
Tara Howard called and reports she is having abdominal cramping. She isn't sure if it is a side effect from the new medications. I did tell her Dr Madilyn Fireman recommended Dexilant. She agreed and would like it sent to [facility] on [address]. I advised patient if symptoms worsened to go to the urgent care.

## 2018-11-23 NOTE — Telephone Encounter (Signed)
Prescription for Dexilant sent to [facility].  We can always try the higher strength if needed.

## 2018-11-26 MED ORDER — PREGABALIN 150 MG PO CAPS: 150.0000 mg | ORAL_CAPSULE | Freq: Two times a day (BID) | ORAL | 0 refills | Status: DC

## 2018-11-26 NOTE — Telephone Encounter (Signed)
Tara Howard states she did pick up the prescription. She is doing better today. She states Dr Madilyn Fireman mentioned a lower dose of the Lyrica because of the dizziness. She states she did stop the evening dose of the 200 mg twice daily. It has helped with her symptoms.

## 2018-11-26 NOTE — Telephone Encounter (Signed)
OK, sounds good. I am glad she is feeling some better.   I will send over the 150mg  lyrica instead of 200mg .

## 2018-11-27 NOTE — Telephone Encounter (Signed)
Pt advised. Reports yesterday felt "like a normal day. No further questions.

## 2018-12-06 ENCOUNTER — Encounter: Payer: Self-pay | Admitting: Family Medicine

## 2018-12-06 ENCOUNTER — Telehealth (INDEPENDENT_AMBULATORY_CARE_PROVIDER_SITE_OTHER): Payer: BC Managed Care – PPO | Admitting: Family Medicine

## 2018-12-06 VITALS — Temp 98.3°F | Ht 64.0 in | Wt 152.0 lb

## 2018-12-06 DIAGNOSIS — M797 Fibromyalgia: Secondary | ICD-10-CM | POA: Diagnosis not present

## 2018-12-06 DIAGNOSIS — K219 Gastro-esophageal reflux disease without esophagitis: Secondary | ICD-10-CM

## 2018-12-06 DIAGNOSIS — R42 Dizziness and giddiness: Secondary | ICD-10-CM | POA: Diagnosis not present

## 2018-12-06 DIAGNOSIS — R1084 Generalized abdominal pain: Secondary | ICD-10-CM | POA: Diagnosis not present

## 2018-12-06 NOTE — Progress Notes (Signed)
Pt only takes 200 mg (old rx) of Lyrica in the morning she still has some dizziness with this. Also she didn't get the refill from pharmacy that Dr. Madilyn Fireman was going to send for new script . She also wanted to know if she should be taking the Dexilant daily in addition to the Protonix or just taking the Chilo for the abdominal cramping as needed. Marland KitchenMaryruth Howard, [name], CMA

## 2018-12-06 NOTE — Progress Notes (Signed)
Virtual Visit via Video Note  I connected with Tara Howard on 12/06/18 at  3:40 PM EDT by a video enabled telemedicine application and verified that I am speaking with the correct person using two identifiers.   I discussed the limitations of evaluation and management by telemedicine and the availability of in person appointments. The patient expressed understanding and agreed to proceed.  Pt was at home and I was in my office for the virtual visit.     Subjective:    CC: F/U Dizziness.   HPI: Fibromyalgia - we had tried to decrase he dose bc of dizziness.  Pt only takes 200 mg (old rx) once a day (nstead of BID) of Lyrica in the morning she still has some dizziness with this. Also she didn't get the refill from pharmacy that Dr. Madilyn Fireman was going to send for new script.  She has had some more back pain and let pain since decreasing her dose. She has also been sitting on her bed to work vfrom home as she doesn't have a space to work in.   Dizziness - improved since decreased her Lyrica dose.  infact she says it is completely resolved except a couple of times notices her "eyes felt off" when she would turn quickly.   Abdominal pain and cramping. - she wonders if she actually had a GI bug/infection.  She also wanted to know if she should be taking the Dexilant daily in addition to the Protonix or just taking the [place] for the abdominal cramping as needed   Past medical history, Surgical history, Family history not pertinant except as noted below, Social history, Allergies, and medications have been entered into the medical record, reviewed, and corrections made.   Review of Systems: No fevers, chills, night sweats, weight loss, chest pain, or shortness of breath.   Objective:    General: Speaking clearly in complete sentences without any shortness of breath.  Alert and oriented x3.  Normal judgment. No apparent acute distress.    Impression and Recommendations:   Fibromylagia -  she s down to 200mg  of the lyrica once a day. She has more achiness in her   GERD - she is doing well on Dexilant. Ok to stop the protonix.    Dizziness - says it is completely gone. Continue with 200mg  of lyrica daily for now.    Abd pain and cramping - resolved. May have been a GI bug, but since changing to [place] she has felt much better.        I discussed the assessment and treatment plan with the patient. The patient was provided an opportunity to ask questions and all were answered. The patient agreed with the plan and demonstrated an understanding of the instructions.   The patient was advised to call back or seek an in-person evaluation if the symptoms worsen or if the condition fails to improve as anticipated.   Tara Lecher, MD

## 2018-12-24 HISTORY — PX: BREAST RECONSTRUCTION: SHX9

## 2018-12-27 ENCOUNTER — Ambulatory Visit (INDEPENDENT_AMBULATORY_CARE_PROVIDER_SITE_OTHER): Payer: BC Managed Care – PPO | Admitting: Family Medicine

## 2018-12-27 ENCOUNTER — Encounter: Payer: Self-pay | Admitting: Family Medicine

## 2018-12-27 VITALS — BP 111/68 | HR 93 | Ht 64.0 in | Wt 149.0 lb

## 2018-12-27 DIAGNOSIS — M7062 Trochanteric bursitis, left hip: Secondary | ICD-10-CM

## 2018-12-27 DIAGNOSIS — J4540 Moderate persistent asthma, uncomplicated: Secondary | ICD-10-CM

## 2018-12-27 MED ORDER — FLUTICASONE FUROATE-VILANTEROL 100-25 MCG/INH IN AEPB: 1.0000 | INHALATION_SPRAY | RESPIRATORY_TRACT | 2 refills | Status: DC

## 2018-12-27 MED ORDER — CYCLOBENZAPRINE HCL 10 MG PO TABS: 5.0000 mg | ORAL_TABLET | Freq: Every evening | ORAL | 0 refills | Status: DC | PRN

## 2018-12-27 NOTE — Progress Notes (Signed)
Acute Office Visit  Subjective:    Patient ID: Tara Howard, female    DOB: February 14, 1971, 48 y.o.   MRN: [id number]  Chief Complaint  Patient presents with  . Leg Pain  . Asthma    HPI Patient is in today for leg upper outer leg pain over the hip.  She says is really been going on since last fall.  She would notice that it would aggravate with certain activities and then other times it seemed better.  More recently though she is noticed it when she changes position from sitting to standing.  She says when she starts walking it does okay but then starts to hurt again.  She normally walks 2 miles for exercise and says she is really only been able to walk about 2 blocks.  Plus now when she turns over at night and puts pressure on the hip she has to get back off of it.  The pain will occasionally radiate all the way down to the knee over the lateral thigh.  Sometimes even down past the knee.  He is not really taking any medication for it.  She has tried Tylenol but it did not seem to help and she really tries to avoid NSAIDs.  She has not used any heat or ice either.  Also feels like her asthma is flaring up for about the last 2 weeks.  She has actually been using her Qvar 2 puffs in the morning and 1 puff in the evening and has had to use 2 to 3 puffs of her rescue inhaler in the morning and then sometimes a couple extra puffs during the day over the last 2 weeks.  She denies any fever fever or chills.  She has had a cough with it.  But she does not feel like she is had any additional respiratory symptoms like sore throat or sinus congestion.  Past Medical History:  Diagnosis Date  . 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 Anxiety   . Asthma   . Breast cancer ([place])   . Chronic headache   . Depression   . Fibromyalgia   . Fibromyalgia 2006   pain / headaches  . GERD (gastroesophageal reflux disease)   . IBS (irritable bowel syndrome)     Past Surgical History:  Procedure Laterality Date  .  CESAREAN SECTION     X 2  . ENDOMETRIAL ABLATION    . ESOPHAGOGASTRODUODENOSCOPY     said it was done maybe about 10 years ago. In [place]  . TONSILLECTOMY    . TUBAL LIGATION    . UPPER GASTROINTESTINAL ENDOSCOPY      Family History  Problem Relation Age of Onset  . CAD Father        3 vessel bypass  . Depression Mother   . Hypertension Mother   . Heart attack Mother   . CAD Mother        3 vessel bypass  . Irritable bowel syndrome Mother   . Cancer Paternal Grandmother        of the bowel per patient   . Esophageal cancer Paternal Grandmother     Social History   Socioeconomic History  . Marital status: Married    Spouse name: Not on file  . Number of children: 2  . Years of education: Not on file  . Highest education level: Not on file  Occupational History  . Occupation: Wellsite geologist: [employer]  Comment: [employer]  Social Needs  . Financial resource strain: Not on file  . Food insecurity:    Worry: Not on file    Inability: Not on file  . Transportation needs:    Medical: Not on file    Non-medical: Not on file  Tobacco Use  . Smoking status: Never Smoker  . Smokeless tobacco: Never Used  Substance and Sexual Activity  . Alcohol use: No  . Drug use: Never  . Sexual activity: Yes  Lifestyle  . Physical activity:    Days per week: Not on file    Minutes per session: Not on file  . Stress: Not on file  Relationships  . Social connections:    Talks on phone: Not on file    Gets together: Not on file    Attends religious service: Not on file    Active member of club or organization: Not on file    Attends meetings of clubs or organizations: Not on file    Relationship status: Not on file  . Intimate partner violence:    Fear of current or ex partner: Not on file    Emotionally abused: Not on file    Physically abused: Not on file    Forced sexual activity: Not on file  Other Topics Concern  . Not on  file  Social History Narrative  . Not on file    Outpatient Medications Prior to Visit  Medication Sig Dispense Refill  . beclomethasone (QVAR REDIHALER) 40 MCG/ACT inhaler INHALE 2 PUFFS INTO THE LUNGS 2 TIMES A DAY (Patient taking differently: 1 puff daily. INHALE 2 PUFFS INTO THE LUNGS 2 TIMES A DAY) 10.6 g 3  . cetirizine (ZYRTEC) 10 MG tablet Take 10 mg by mouth daily.    Marland Kitchen Dexlansoprazole (DEXILANT) 30 MG capsule Take 1 capsule (30 mg total) by mouth daily. 30 capsule 11  . fluticasone (FLONASE) 50 MCG/ACT nasal spray Place 1 spray into the nose daily.    Marland Kitchen levalbuterol (XOPENEX HFA) 45 MCG/ACT inhaler Inhale 2 puffs into the lungs every 6 (six) hours as needed for wheezing or shortness of breath. Pt will call when needs to filled. 15 g 1  . lubiprostone (AMITIZA) 24 MCG capsule Take 1 capsule (24 mcg total) by mouth 2 (two) times daily with a meal. 60 capsule 3  . Multiple Vitamins-Minerals (EQ ONE DAILY WOMENS HEALTH) TABS Take 1 tablet by mouth daily.    . Omega 3 1200 MG CAPS Take 1 capsule by mouth daily.    . pantoprazole (PROTONIX) 40 MG tablet Take 1 tablet (40 mg total) by mouth 2 (two) times daily. 60 tablet 1  . pregabalin (LYRICA) 150 MG capsule Take 1 capsule (150 mg total) by mouth 2 (two) times daily. 60 capsule 0  . tamoxifen (NOLVADEX) 20 MG tablet Take 20 mg by mouth at bedtime.    . traZODone (DESYREL) 100 MG tablet TAKE 1 & 1/2 TABLETS BY MOUTH AT BEDTIME 135 tablet 3  . venlafaxine (EFFEXOR) 37.5 MG tablet Take 37.5 mg by mouth daily.    . Wheat Dextrin (BENEFIBER DRINK MIX PO) Take by mouth.     No facility-administered medications prior to visit.     Allergies  Allergen Reactions  . Azithromycin Other (See Comments) and Nausea And Vomiting    Nausea and diarrhea   . Augmentin [Amoxicillin-Pot Clavulanate] Other (See Comments)    Stomach cramps, diarrhea and vomiting  . Flexeril [Cyclobenzaprine] Other (See Comments)  constipation  . Linzess  [Linaclotide] Other (See Comments)  . Zolpidem Tartrate Other (See Comments)    Hallucinations    ROS     Objective:    Physical Exam  Constitutional: She is oriented to person, place, and time. She appears well-developed and well-nourished.  HENT:  Head: Normocephalic and atraumatic.  Right Ear: External ear normal.  Left Ear: External ear normal.  Nose: Nose normal.  TMs and canals are clear.   Eyes: Pupils are equal, round, and reactive to light. Conjunctivae and EOM are normal.  Neck: Neck supple. No thyromegaly present.  Cardiovascular: Normal rate, regular rhythm and normal heart sounds.  Pulmonary/Chest: Effort normal and breath sounds normal. She has no wheezes.  Lymphadenopathy:    She has no cervical adenopathy.  Neurological: She is alert and oriented to person, place, and time.  Skin: Skin is warm and dry.  Psychiatric: She has a normal mood and affect. Her behavior is normal.    BP 111/68   Pulse 93   Ht 5\' 4"  (1.626 m)   Wt 149 lb (67.6 kg)   SpO2 100%   BMI 25.58 kg/m  Wt Readings from Last 3 Encounters:  12/27/18 149 lb (67.6 kg)  12/06/18 152 lb (68.9 kg)  11/22/18 150 lb 9.6 oz (68.3 kg)    There are no preventive care reminders to display for this patient.  There are no preventive care reminders to display for this patient.   Lab Results  Component Value Date   TSH 1.235 10/09/2014   Lab Results  Component Value Date   WBC 5.3 10/09/2014   HGB 13.8 10/09/2014   HCT 41.7 10/09/2014   MCV 94.1 10/09/2014   PLT 249 10/09/2014   Lab Results  Component Value Date   NA 140 08/08/2016   NA 140 08/08/2016   K 4.3 08/08/2016   K 4.3 08/08/2016   CO2 27 10/09/2014   GLUCOSE 84 10/09/2014   BUN 12 08/08/2016   BUN 18 08/08/2016   CREATININE 0.7 08/08/2016   CREATININE 0.7 08/08/2016   BILITOT 0.5 10/09/2014   ALKPHOS 51 08/08/2016   AST 15 08/08/2016   AST 15 08/08/2016   ALT 13 08/08/2016   ALT 13 08/08/2016   PROT 6.4 10/09/2014    ALBUMIN 4.3 10/09/2014   CALCIUM 9.2 10/09/2014   Lab Results  Component Value Date   CHOL 146 08/08/2016   CHOL 146 08/08/2016   Lab Results  Component Value Date   HDL 48 08/08/2016   HDL 48 08/08/2016   Lab Results  Component Value Date   LDLCALC 85 08/08/2016   LDLCALC 85 08/08/2016   Lab Results  Component Value Date   TRIG 65 08/08/2016   TRIG 65 08/08/2016   No results found for: CHOLHDL No results found for: HGBA1C     Assessment & Plan:   Problem List Items Addressed This Visit      Respiratory   Asthma - Primary   Relevant Medications   fluticasone furoate-vilanterol (BREO ELLIPTA) 100-25 MCG/INH AEPB    Other Visit Diagnoses    Greater trochanteric bursitis, left         Asthma, moderate persistent-discussed adjusting her regimen from just an inhaled corticosteroid to an inhaled corticosteroid with long-acting albuterol.  Prescription sent to pharmacy for [facility] to take daily.  Hopefully this will reduce how often she is using her albuterol over the weekend.  If she is not improving then encouraged her to call back so  that we can do a prednisone burst if needed.  Left greater trochanteric bursitis-discussed diagnosis based on her clinical exam and history.  She is tender directly over the greater trochanter.  We discussed therapy/exercises to do on her own at home and handout was provided.  We also discussed an injection of steroid as a therapeutic option especially since at this point her symptoms have been going on for greater than 6 months.  She says she would like to have the injection so that she can get back to walking.   /Injection Procedure Note Ajee Heasley [id number] 07-05-1971  Procedure: Injection Indications: pain relief  Procedure Details Consent: Risks of procedure as well as the alternatives and risks of each were explained to the (patient/caregiver).  Consent for procedure obtained. Time Out: Verified patient identification,  verified procedure, site/side was marked, verified correct patient position, special equipment/implants available, medications/allergies/relevent history reviewed, required imaging and test results available.  Performed   Local Anesthesia Used:Ethyl Chloride Spray Amount of Fluid Aspirated: minimal amount Injection:  1 cc of 40mg  Kenalog with 9 cc of 1% lidocaine.   A sterile dressing was applied.  Patient did tolerate procedure well. Estimated blood loss: none  Beatrice Lecher 12/27/2018, 12:48 PM   Meds ordered this encounter  Medications  . cyclobenzaprine (FLEXERIL) 10 MG tablet    Sig: Take 0.5-1 tablets (5-10 mg total) by mouth at bedtime as needed for muscle spasms.    Dispense:  30 tablet    Refill:  0  . fluticasone furoate-vilanterol (BREO ELLIPTA) 100-25 MCG/INH AEPB    Sig: Inhale 1 puff into the lungs every morning.    Dispense:  30 each    Refill:  2     Beatrice Lecher, MD

## 2018-12-27 NOTE — Patient Instructions (Signed)
See exercises for trochanteric bursiits. Call if not improving.

## 2019-01-15 ENCOUNTER — Telehealth: Payer: Self-pay

## 2019-01-15 MED ORDER — DEXILANT 60 MG PO CPDR: 60.0000 mg | DELAYED_RELEASE_CAPSULE | Freq: Every day | ORAL | 2 refills | Status: DC

## 2019-01-15 NOTE — Telephone Encounter (Signed)
Patient advised.

## 2019-01-15 NOTE — Telephone Encounter (Signed)
Pt left msg stating Dexilant needs to be increased. She is still feeling acid coming up in her throat. Scheduled for surgery tomorrow and wanting higher dose called in prior to that. Please advise

## 2019-01-15 NOTE — Telephone Encounter (Signed)
Higher stregnth sent to pharmacy.

## 2019-01-22 ENCOUNTER — Telehealth: Payer: Self-pay | Admitting: Family Medicine

## 2019-01-22 NOTE — Telephone Encounter (Signed)
Tara Howard called into the office to let you know that the new inhaler (Breo-ellipta) prescribed on 12/27/18 did not work.  She used it but it made her shaky and she is having to use her rescue inhaler two to three times daily.  She would like to know what her next steps are.

## 2019-01-23 MED ORDER — FLUTICASONE-SALMETEROL 100-50 MCG/DOSE IN AEPB: 1.0000 | INHALATION_SPRAY | Freq: Two times a day (BID) | RESPIRATORY_TRACT | 1 refills | Status: DC

## 2019-01-23 NOTE — Telephone Encounter (Signed)
Pt advised Will call with update  

## 2019-01-23 NOTE — Telephone Encounter (Signed)
Okay, discontinue Breo I will switch to Advair which is taken twice a day and see if she feels like it is effective and not causing her to feel shaky.  Is consider referral to pulmonology as well if not improving.

## 2019-01-24 ENCOUNTER — Ambulatory Visit: Payer: BC Managed Care – PPO | Admitting: Family Medicine

## 2019-01-24 ENCOUNTER — Ambulatory Visit (INDEPENDENT_AMBULATORY_CARE_PROVIDER_SITE_OTHER): Payer: BC Managed Care – PPO | Admitting: Family Medicine

## 2019-01-24 VITALS — Ht 64.0 in | Wt 145.0 lb

## 2019-01-24 DIAGNOSIS — K581 Irritable bowel syndrome with constipation: Secondary | ICD-10-CM | POA: Diagnosis not present

## 2019-01-24 DIAGNOSIS — K5900 Constipation, unspecified: Secondary | ICD-10-CM | POA: Diagnosis not present

## 2019-01-24 DIAGNOSIS — J4540 Moderate persistent asthma, uncomplicated: Secondary | ICD-10-CM

## 2019-01-24 DIAGNOSIS — R05 Cough: Secondary | ICD-10-CM

## 2019-01-24 DIAGNOSIS — R053 Chronic cough: Secondary | ICD-10-CM

## 2019-01-24 MED ORDER — TRULANCE 3 MG PO TABS: 1.0000 | ORAL_TABLET | Freq: Every day | ORAL | 1 refills | Status: DC

## 2019-01-24 NOTE — Progress Notes (Signed)
Virtual Visit via Video Note  I connected with Tyashia Colleran on 01/28/19 at 11:10 AM EDT by a video enabled telemedicine application and verified that I am speaking with the correct person using two identifiers.   I discussed the limitations of evaluation and management by telemedicine and the availability of in person appointments. The patient expressed understanding and agreed to proceed.  Subjective:    CC: IBS-  HPI: Having significant problems with constipation. Amitiza not working well. Today had BM, did not have BM since Monday prior to that. Having lots of issues with constipation. Had diarrhea over the weekend, but only because of antibiotics. She is having to use multiple does of benefiber and miralax daily (she was just taking one dose daily). Has had issues with constipation since having surgeries   Past medical history, Surgical history, Family history not pertinant except as noted below, Social history, Allergies, and medications have been entered into the medical record, reviewed, and corrections made.   Review of Systems: No fevers, chills, night sweats, weight loss, chest pain, or shortness of breath.   Objective:    General: Speaking clearly in complete sentences without any shortness of breath.  Alert and oriented x3.  Normal judgment. No apparent acute distress.    Impression and Recommendations:   IBS, constipation predmoninent -gust options.  I would like to try Trulance that she has not tried that before.  Did double check to make sure that it would not interfere with her tamoxifen.  New prescription sent to the pharmacy.F/U in 1 mo.     Time spent 20 min in non-face to face encouter.      I discussed the assessment and treatment plan with the patient. The patient was provided an opportunity to ask questions and all were answered. The patient agreed with the plan and demonstrated an understanding of the instructions.   The patient was advised to call back or  seek an in-person evaluation if the symptoms worsen or if the condition fails to improve as anticipated.   Tara Lecher, MD

## 2019-01-24 NOTE — Progress Notes (Signed)
Amitiza not working well. Today had BM, did not have BM since Monday prior to that. Having lots of issues with constipation. Had diarrhea over the weekend, but only because of antibiotics. She is having to use multiple does of benefiber and miralax daily (she was just taking one dose daily). Has had issues with constipation since having surgeries.

## 2019-01-28 ENCOUNTER — Encounter: Payer: Self-pay | Admitting: Family Medicine

## 2019-01-29 ENCOUNTER — Ambulatory Visit (INDEPENDENT_AMBULATORY_CARE_PROVIDER_SITE_OTHER): Payer: BC Managed Care – PPO

## 2019-01-29 ENCOUNTER — Ambulatory Visit (INDEPENDENT_AMBULATORY_CARE_PROVIDER_SITE_OTHER): Payer: BC Managed Care – PPO | Admitting: Family Medicine

## 2019-01-29 ENCOUNTER — Other Ambulatory Visit: Payer: Self-pay

## 2019-01-29 ENCOUNTER — Encounter: Payer: Self-pay | Admitting: Family Medicine

## 2019-01-29 VITALS — BP 115/72 | HR 105 | Ht 64.0 in | Wt 148.0 lb

## 2019-01-29 DIAGNOSIS — M25552 Pain in left hip: Secondary | ICD-10-CM

## 2019-01-29 DIAGNOSIS — J4530 Mild persistent asthma, uncomplicated: Secondary | ICD-10-CM | POA: Diagnosis not present

## 2019-01-29 DIAGNOSIS — K5900 Constipation, unspecified: Secondary | ICD-10-CM

## 2019-01-29 DIAGNOSIS — K581 Irritable bowel syndrome with constipation: Secondary | ICD-10-CM | POA: Diagnosis not present

## 2019-01-29 DIAGNOSIS — F430 Acute stress reaction: Secondary | ICD-10-CM

## 2019-01-29 MED ORDER — VENLAFAXINE HCL 75 MG PO TABS: 75.0000 mg | ORAL_TABLET | Freq: Every day | ORAL | 0 refills | Status: DC

## 2019-01-29 NOTE — Assessment & Plan Note (Signed)
Improved with the switch to Advair.  We will continue with current regimen for the next the next couple of months and then try to taper her back down.

## 2019-01-29 NOTE — Assessment & Plan Note (Signed)
She is working with her pastor did encourage her to still consider working with a Furniture conservator/restorer.  For now we will continue the Effexor and we will switch the strength to 75 mg.  Follow-up in 3 to 4 months.

## 2019-01-29 NOTE — Progress Notes (Signed)
Established Patient Office Visit  Subjective:  Patient ID: Tara Howard, female    DOB: February 20, 1971  Age: 48 y.o. MRN: [id number]  CC:  Chief Complaint  Patient presents with  . mood    HPI Tara Howard presents for follow-up mood.  She is doing okay overall.  She is still living in the house with her husband even though he is asked for divorce and that is been quite stressful.  She feels like considering the situation she is doing okay.  She is currently taking 75 mg of Effexor and doing well.  She is taking 2 of the 37.5's.  Follow-up chronic to chronic constipation, IBS-she had had side effects with linzess and felt the amitiza wasn't working well.  We decided to try Trulance and so far has been doing really well on it.    Follow-up asthma-we recently switched her to Advair because she felt like her inhaler was not working well.  She says it is been working much better since the switch.  Acute stress-she is not currently seeing a therapist or counselor but has been working with her pastor.  But she is planning on reaching out to friends counselor that was recommended to her.  She is also still having some left hip pain.  I saw her 4 weeks ago for left hip pain which I felt was most consistent with bursitis.  We did an injection at that time and she was doing a little better until this past weekend when she was trying to do some of the stretches that I gave her when it felt suddenly worse after the stretching.  She is been alternating heat and ice that does not seem to help.  Anti-inflammatories are not helping.  She really wants to avoid oral prednisone.  Also reports that over the years she is noticed some occasional catching in the hip she says doing things like lifting to shave her leg or trying to do certain stretches she will feel like the hip will catch and she will have to actually rotate her hip upward to move it back into position.  She says is not usually painful when it  happens.  She denies any old injuries or trauma.    Past Medical History:  Diagnosis Date  . 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 Anxiety   . Asthma   . Breast cancer ([place])   . Chronic headache   . Depression   . Fibromyalgia   . Fibromyalgia 2006   pain / headaches  . GERD (gastroesophageal reflux disease)   . IBS (irritable bowel syndrome)     Past Surgical History:  Procedure Laterality Date  . CESAREAN SECTION     X 2  . ENDOMETRIAL ABLATION    . ESOPHAGOGASTRODUODENOSCOPY     said it was done maybe about 10 years ago. In [place]  . TONSILLECTOMY    . TUBAL LIGATION    . UPPER GASTROINTESTINAL ENDOSCOPY      Family History  Problem Relation Age of Onset  . CAD Father        3 vessel bypass  . Depression Mother   . Hypertension Mother   . Heart attack Mother   . CAD Mother        3 vessel bypass  . Irritable bowel syndrome Mother   . Cancer Paternal Grandmother        of the bowel per patient   . Esophageal cancer Paternal Grandmother  Social History   Socioeconomic History  . Marital status: Married    Spouse name: Not on file  . Number of children: 2  . Years of education: Not on file  . Highest education level: Not on file  Occupational History  . 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 Financial resource strain: Not on file  . Food insecurity    Worry: Not on file    Inability: Not on file  . Transportation needs    Medical: Not on file    Non-medical: Not on file  Tobacco Use  . Smoking status: Never Smoker  . Smokeless tobacco: Never Used  Substance and Sexual Activity  . Alcohol use: No  . Drug use: Never  . Sexual activity: Yes  Lifestyle  . Physical activity    Days per week: Not on file    Minutes per session: Not on file  . Stress: Not on file  Relationships  . Social Herbalist on phone: Not on file    Gets together: Not on file     Attends religious service: Not on file    Active member of club or organization: Not on file    Attends meetings of clubs or organizations: Not on file    Relationship status: Not on file  . Intimate partner violence    Fear of current or ex partner: Not on file    Emotionally abused: Not on file    Physically abused: Not on file    Forced sexual activity: Not on file  Other Topics Concern  . Not on file  Social History Narrative  . Not on file    Outpatient Medications Prior to Visit  Medication Sig Dispense Refill  . cetirizine (ZYRTEC) 10 MG tablet Take 10 mg by mouth daily.    Marland Kitchen dexlansoprazole (DEXILANT) 60 MG capsule Take 1 capsule (60 mg total) by mouth daily. 30 capsule 2  . fluticasone (FLONASE) 50 MCG/ACT nasal spray Place 1 spray into the nose daily.    . Fluticasone-Salmeterol (ADVAIR DISKUS) 100-50 MCG/DOSE AEPB Inhale 1 puff into the lungs 2 (two) times a day. 60 each 1  . levalbuterol (XOPENEX HFA) 45 MCG/ACT inhaler Inhale 2 puffs into the lungs every 6 (six) hours as needed for wheezing or shortness of breath. Pt will call when needs to filled. 15 g 1  . lubiprostone (AMITIZA) 24 MCG capsule Take 1 capsule (24 mcg total) by mouth 2 (two) times daily with a meal. 60 capsule 3  . Multiple Vitamins-Minerals (EQ ONE DAILY WOMENS HEALTH) TABS Take 1 tablet by mouth daily.    Marland Kitchen Plecanatide (TRULANCE) 3 MG TABS Take 1 tablet by mouth daily. 30 tablet 1  . polyethylene glycol (MIRALAX / GLYCOLAX) 17 g packet Take 17 g by mouth daily. Switches between this medication and benefiber    . pregabalin (LYRICA) 150 MG capsule Take 1 capsule (150 mg total) by mouth 2 (two) times daily. 60 capsule 0  . tamoxifen (NOLVADEX) 20 MG tablet Take 20 mg by mouth at bedtime.    . traZODone (DESYREL) 100 MG tablet TAKE 1 & 1/2 TABLETS BY MOUTH AT BEDTIME 135 tablet 3  . Wheat Dextrin (BENEFIBER DRINK MIX PO) Take by mouth.    . beclomethasone (QVAR REDIHALER) 40 MCG/ACT inhaler INHALE 2 PUFFS  INTO THE LUNGS 2 TIMES A DAY (Patient taking differently: 1 puff daily. INHALE  2 PUFFS INTO THE LUNGS 2 TIMES A DAY) 10.6 g 3  . venlafaxine (EFFEXOR) 37.5 MG tablet Take 37.5 mg by mouth daily.    . cyclobenzaprine (FLEXERIL) 10 MG tablet Take 0.5-1 tablets (5-10 mg total) by mouth at bedtime as needed for muscle spasms. (Patient not taking: Reported on 01/24/2019) 30 tablet 0   No facility-administered medications prior to visit.     Allergies  Allergen Reactions  . Azithromycin Other (See Comments) and Nausea And Vomiting    Nausea and diarrhea   . Augmentin [Amoxicillin-Pot Clavulanate] Other (See Comments)    Stomach cramps, diarrhea and vomiting  . Flexeril [Cyclobenzaprine] Other (See Comments)    constipation  . Linzess [Linaclotide] Other (See Comments)  . Zolpidem Tartrate Other (See Comments)    Hallucinations    ROS Review of Systems    Objective:    Physical Exam  BP 115/72   Pulse (!) 105   Ht 5\' 4"  (1.626 m)   Wt 148 lb (67.1 kg)   SpO2 99%   BMI 25.40 kg/m  Wt Readings from Last 3 Encounters:  01/29/19 148 lb (67.1 kg)  01/24/19 145 lb (65.8 kg)  12/27/18 149 lb (67.6 kg)     There are no preventive care reminders to display for this patient.  There are no preventive care reminders to display for this patient.  Lab Results  Component Value Date   TSH 1.235 10/09/2014   Lab Results  Component Value Date   WBC 5.3 10/09/2014   HGB 13.8 10/09/2014   HCT 41.7 10/09/2014   MCV 94.1 10/09/2014   PLT 249 10/09/2014   Lab Results  Component Value Date   NA 140 08/08/2016   NA 140 08/08/2016   K 4.3 08/08/2016   K 4.3 08/08/2016   CO2 27 10/09/2014   GLUCOSE 84 10/09/2014   BUN 12 08/08/2016   BUN 18 08/08/2016   CREATININE 0.7 08/08/2016   CREATININE 0.7 08/08/2016   BILITOT 0.5 10/09/2014   ALKPHOS 51 08/08/2016   AST 15 08/08/2016   AST 15 08/08/2016   ALT 13 08/08/2016   ALT 13 08/08/2016   PROT 6.4 10/09/2014   ALBUMIN 4.3  10/09/2014   CALCIUM 9.2 10/09/2014   Lab Results  Component Value Date   CHOL 146 08/08/2016   CHOL 146 08/08/2016   Lab Results  Component Value Date   HDL 48 08/08/2016   HDL 48 08/08/2016   Lab Results  Component Value Date   LDLCALC 85 08/08/2016   LDLCALC 85 08/08/2016   Lab Results  Component Value Date   TRIG 65 08/08/2016   TRIG 65 08/08/2016   No results found for: CHOLHDL No results found for: HGBA1C    Assessment & Plan:   Problem List Items Addressed This Visit      Respiratory   Extrinsic asthma    Improved with the switch to Advair.  We will continue with current regimen for the next the next couple of months and then try to taper her back down.      Asthma     Digestive   IRRITABLE BOWEL SYNDROME    So far doing great on true [place].  Right now it is covered because she has hit her deductible for the year.  Unfortunately it will likely be very costly at the beginning of the new year but we will see what we can do at that time.        Other  Constipation   Acute stress reaction    She is working with her pastor did encourage her to still consider working with a Furniture conservator/restorer.  For now we will continue the Effexor and we will switch the strength to 75 mg.  Follow-up in 3 to 4 months.      Relevant Medications   venlafaxine (EFFEXOR) 75 MG tablet    Other Visit Diagnoses    Left hip pain    -  Primary   Relevant Orders   DG Hip Unilat W OR W/O Pelvis 2-3 Views Left     Left hip pain-I felt it was most consistent with bursitis but had a worsening of symptoms over the weekend.  Is a little too soon to do another injection at this point.  I am concerned also about the catching that is been happening for years and get a move forward with an x-ray today.  Consider muscle relaxer and possible referral to sports medicine depending on the findings.  Meds ordered this encounter  Medications  . venlafaxine (EFFEXOR) 75 MG tablet    Sig: Take  1 tablet (75 mg total) by mouth daily.    Dispense:  90 tablet    Refill:  0    Follow-up: Return in about 3 months (around 05/01/2019) for Mood.    Beatrice Lecher, MD

## 2019-01-29 NOTE — Assessment & Plan Note (Signed)
So far doing great on true [place].  Right now it is covered because she has hit her deductible for the year.  Unfortunately it will likely be very costly at the beginning of the new year but we will see what we can do at that time.

## 2019-01-31 ENCOUNTER — Encounter: Payer: Self-pay | Admitting: Sports Medicine

## 2019-01-31 ENCOUNTER — Other Ambulatory Visit: Payer: Self-pay

## 2019-01-31 ENCOUNTER — Ambulatory Visit: Payer: BC Managed Care – PPO | Admitting: Family Medicine

## 2019-01-31 ENCOUNTER — Ambulatory Visit (INDEPENDENT_AMBULATORY_CARE_PROVIDER_SITE_OTHER): Payer: BC Managed Care – PPO | Admitting: Sports Medicine

## 2019-01-31 DIAGNOSIS — M25552 Pain in left hip: Secondary | ICD-10-CM

## 2019-01-31 DIAGNOSIS — M1612 Unilateral primary osteoarthritis, left hip: Secondary | ICD-10-CM | POA: Insufficient documentation

## 2019-01-31 MED ORDER — CELECOXIB 100 MG PO CAPS: ORAL_CAPSULE | ORAL | 3 refills | Status: DC

## 2019-01-31 NOTE — Patient Instructions (Signed)
Hip Rehabilitation Protocol:  1.  Side leg raises.  3x30 with no weight, then 3x15 with 2 lb ankle weight, then 3x15 with 5 lb ankle weight 2.  Standing hip rotation.  3x30 with no weight, then 3x15 with 2 lb ankle weight, then 3x15 with 5 lb ankle weight. 3.  Side step ups.  3x30 with no weight, then 3x15 with 5 lbs in backpack, then 3x15 with 10 lbs in backpack. 

## 2019-01-31 NOTE — Assessment & Plan Note (Addendum)
I would agree with Dr. Madilyn Fireman that this represents trochanteric bursitis. She does have some mild hip osteoarthritis as well. I am going to change her hip rehabilitation exercises, she does have some GERD so we will add a low-dose Celebrex to be taken with food. She did have an upper endoscopy recently that was normal. Return to see me in 4 weeks, we will try another trochanteric bursa injection this time with ultrasound guidance, we should also keep the hip joint in our minds as a possible pain generator but I was unable to elicit any pain from the hip joint today.

## 2019-01-31 NOTE — Progress Notes (Signed)
Subjective:    CC: Left hip pain  HPI: 4 weeks now this pleasant 48 year old female has had pain that she localizes in the lateral aspect of her left hip, worse laying on the ipsilateral side, worse with weightbearing, she had an injection by her PCP, with temporary relief.  She has been trying to do her rehab exercises, but is having too much discomfort.  Symptoms are moderate, persist, localized without radiation.  I reviewed the past medical history, family history, social history, surgical history, and allergies today and no changes were needed.  Please see the problem list section below in epic for further details.  Past Medical History: Past Medical History:  Diagnosis Date  . 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 Anxiety   . Asthma   . Breast cancer ([place])   . Chronic headache   . Depression   . Fibromyalgia   . Fibromyalgia 2006   pain / headaches  . GERD (gastroesophageal reflux disease)   . IBS (irritable bowel syndrome)    Past Surgical History: Past Surgical History:  Procedure Laterality Date  . CESAREAN SECTION     X 2  . ENDOMETRIAL ABLATION    . ESOPHAGOGASTRODUODENOSCOPY     said it was done maybe about 10 years ago. In [place]  . TONSILLECTOMY    . TUBAL LIGATION    . UPPER GASTROINTESTINAL ENDOSCOPY     Social History: Social History   Socioeconomic History  . Marital status: Married    Spouse name: Not on file  . Number of children: 2  . Years of education: Not on file  . Highest education level: Not on file  Occupational History  . 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 Financial resource strain: Not on file  . Food insecurity    Worry: Not on file    Inability: Not on file  . Transportation needs    Medical: Not on file    Non-medical: Not on file  Tobacco Use  . Smoking status: Never Smoker  . Smokeless tobacco: Never Used  Substance and Sexual Activity  .  Alcohol use: No  . Drug use: Never  . Sexual activity: Yes  Lifestyle  . Physical activity    Days per week: Not on file    Minutes per session: Not on file  . Stress: Not on file  Relationships  . Social Herbalist on phone: Not on file    Gets together: Not on file    Attends religious service: Not on file    Active member of club or organization: Not on file    Attends meetings of clubs or organizations: Not on file    Relationship status: Not on file  Other Topics Concern  . Not on file  Social History Narrative  . Not on file   Family History: Family History  Problem Relation Age of Onset  . CAD Father        3 vessel bypass  . Depression Mother   . Hypertension Mother   . Heart attack Mother   . CAD Mother        3 vessel bypass  . Irritable bowel syndrome Mother   . Cancer Paternal Grandmother        of the bowel per patient   . Esophageal cancer Paternal Grandmother    Allergies: Allergies  Allergen Reactions  . Azithromycin  Other (See Comments) and Nausea And Vomiting    Nausea and diarrhea   . Augmentin [Amoxicillin-Pot Clavulanate] Other (See Comments)    Stomach cramps, diarrhea and vomiting  . Flexeril [Cyclobenzaprine] Other (See Comments)    constipation  . Linzess [Linaclotide] Other (See Comments)  . Zolpidem Tartrate Other (See Comments)    Hallucinations   Medications: See med rec.  Review of Systems: No fevers, chills, night sweats, weight loss, chest pain, or shortness of breath.   Objective:    General: Well Developed, well nourished, and in no acute distress.  Neuro: Alert and oriented x3, extra-ocular muscles intact, sensation grossly intact.  HEENT: Normocephalic, atraumatic, pupils equal round reactive to light, neck supple, no masses, no lymphadenopathy, thyroid nonpalpable.  Skin: Warm and dry, no rashes. Cardiac: Regular rate and rhythm, no murmurs rubs or gallops, no lower extremity edema.  Respiratory: Clear to  auscultation bilaterally. Not using accessory muscles, speaking in full sentences. Left hip: ROM IR: 60 Deg, ER: 60 Deg, Flexion: 120 Deg, Extension: 100 Deg, Abduction: 45 Deg, Adduction: 45 Deg Strength IR: 5/5, ER: 5/5, Flexion: 5/5, Extension: 5/5, Abduction: 5/5, Adduction: 5/5 Pelvic alignment unremarkable to inspection and palpation. Standing hip rotation and gait without trendelenburg / unsteadiness. Greater trochanter with tenderness to palpation. No tenderness over piriformis. No SI joint tenderness and normal minimal SI movement. Objective weakness to hip abduction  Hip x-rays personally reviewed, mild osteoarthritis  Impression and Recommendations:    Left hip pain I would agree with Dr. Madilyn Fireman that this represents trochanteric bursitis. She does have some mild hip osteoarthritis as well. I am going to change her hip rehabilitation exercises, she does have some GERD so we will add a low-dose Celebrex to be taken with food. She did have an upper endoscopy recently that was normal. Return to see me in 4 weeks, we will try another trochanteric bursa injection this time with ultrasound guidance, we should also keep the hip joint in our minds as a possible pain generator but I was unable to elicit any pain from the hip joint today.   ___________________________________________ Gwen Her. Dianah Field, M.D., ABFM., CAQSM. Primary Care and Sports Medicine [facility]  Adjunct Professor of [place] of [facility] [employer]

## 2019-02-04 ENCOUNTER — Other Ambulatory Visit: Payer: Self-pay | Admitting: Family Medicine

## 2019-02-04 DIAGNOSIS — F5101 Primary insomnia: Secondary | ICD-10-CM

## 2019-02-05 ENCOUNTER — Encounter: Payer: Self-pay | Admitting: Sports Medicine

## 2019-02-05 ENCOUNTER — Ambulatory Visit (INDEPENDENT_AMBULATORY_CARE_PROVIDER_SITE_OTHER): Payer: BC Managed Care – PPO | Admitting: Sports Medicine

## 2019-02-05 ENCOUNTER — Other Ambulatory Visit: Payer: Self-pay

## 2019-02-05 DIAGNOSIS — M25552 Pain in left hip: Secondary | ICD-10-CM | POA: Diagnosis not present

## 2019-02-05 NOTE — Assessment & Plan Note (Signed)
Today pain was somewhat proximal to the greater trochanter more over the gluteus medius and minimus tendons near the musculotendinous junction. I injected medication superficial to the gluteus medius, between the gluteus medius, and gluteus minimus, I placed a bit of medication deep to all of the hip external rotators at the trochanteric bursa. She had 80 to 100% pain relief after the injection.

## 2019-02-05 NOTE — Progress Notes (Signed)
Subjective:    CC: Follow-up  HPI: This is a pleasant 48 year old female, she has severe left hip pain, she had a trochanteric bursa injection sometime ago, we added hip rehabilitation exercises, she returns today with persistent severe pain.  Localized without radiation.  I reviewed the past medical history, family history, social history, surgical history, and allergies today and no changes were needed.  Please see the problem list section below in epic for further details.  Past Medical History: Past Medical History:  Diagnosis Date  . 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 Anxiety   . Asthma   . Breast cancer ([place])   . Chronic headache   . Depression   . Fibromyalgia   . Fibromyalgia 2006   pain / headaches  . GERD (gastroesophageal reflux disease)   . IBS (irritable bowel syndrome)    Past Surgical History: Past Surgical History:  Procedure Laterality Date  . CESAREAN SECTION     X 2  . ENDOMETRIAL ABLATION    . ESOPHAGOGASTRODUODENOSCOPY     said it was done maybe about 10 years ago. In [place]  . TONSILLECTOMY    . TUBAL LIGATION    . UPPER GASTROINTESTINAL ENDOSCOPY     Social History: Social History   Socioeconomic History  . Marital status: Married    Spouse name: Not on file  . Number of children: 2  . Years of education: Not on file  . Highest education level: Not on file  Occupational History  . 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 Financial resource strain: Not on file  . Food insecurity    Worry: Not on file    Inability: Not on file  . Transportation needs    Medical: Not on file    Non-medical: Not on file  Tobacco Use  . Smoking status: Never Smoker  . Smokeless tobacco: Never Used  Substance and Sexual Activity  . Alcohol use: No  . Drug use: Never  . Sexual activity: Yes  Lifestyle  . Physical activity    Days per week: Not on file    Minutes per session:  Not on file  . Stress: Not on file  Relationships  . Social Herbalist on phone: Not on file    Gets together: Not on file    Attends religious service: Not on file    Active member of club or organization: Not on file    Attends meetings of clubs or organizations: Not on file    Relationship status: Not on file  Other Topics Concern  . Not on file  Social History Narrative  . Not on file   Family History: Family History  Problem Relation Age of Onset  . CAD Father        3 vessel bypass  . Depression Mother   . Hypertension Mother   . Heart attack Mother   . CAD Mother        3 vessel bypass  . Irritable bowel syndrome Mother   . Cancer Paternal Grandmother        of the bowel per patient   . Esophageal cancer Paternal Grandmother    Allergies: Allergies  Allergen Reactions  . Azithromycin Other (See Comments) and Nausea And Vomiting    Nausea and diarrhea   . Augmentin [Amoxicillin-Pot Clavulanate] Other (See Comments)    Stomach cramps, diarrhea and vomiting  .  Flexeril [Cyclobenzaprine] Other (See Comments)    constipation  . Linzess [Linaclotide] Other (See Comments)  . Zolpidem Tartrate Other (See Comments)    Hallucinations   Medications: See med rec.  Review of Systems: No fevers, chills, night sweats, weight loss, chest pain, or shortness of breath.   Objective:    General: Well Developed, well nourished, and in no acute distress.  Neuro: Alert and oriented x3, extra-ocular muscles intact, sensation grossly intact.  HEENT: Normocephalic, atraumatic, pupils equal round reactive to light, neck supple, no masses, no lymphadenopathy, thyroid nonpalpable.  Skin: Warm and dry, no rashes. Cardiac: Regular rate and rhythm, no murmurs rubs or gallops, no lower extremity edema.  Respiratory: Clear to auscultation bilaterally. Not using accessory muscles, speaking in full sentences. Left hip: ROM IR: 60 Deg, ER: 60 Deg, Flexion: 120 Deg, Extension:  100 Deg, Abduction: 45 Deg, Adduction: 45 Deg Strength IR: 5/5, ER: 5/5, Flexion: 5/5, Extension: 5/5, Abduction: 5/5, Adduction: 5/5 Pelvic alignment unremarkable to inspection and palpation. Standing hip rotation and gait without trendelenburg / unsteadiness. Greater trochanter without tenderness to palpation, she does have tenderness somewhat proximal to the greater trochanter over the gluteus medius and minimus.. No tenderness over piriformis. No SI joint tenderness and normal minimal SI movement.  Procedure: Real-time Ultrasound Guided injection of the left gluteus medius and gluteus minimus tendon sheaths Device: GE Logiq E  Verbal informed consent obtained.  Time-out conducted.  Noted no overlying erythema, induration, or other signs of local infection.  Skin prepped in a sterile fashion.  Local anesthesia: Topical Ethyl chloride.  With sterile technique and under real time ultrasound guidance:  Using a 22-gauge spinal needle advanced just superficial to the gluteus medius, I injected some medication, I then advanced between the gluteus medius and gluteus minimus and injected more medication, I then advanced further deep into the greater trochanteric bursa, and injected the remainder of the medication for a total of 1 cc Kenalog 40, 2 cc lidocaine, 2 cc bupivacaine. Completed without difficulty  Pain immediately resolved suggesting accurate placement of the medication.  Advised to call if fevers/chills, erythema, induration, drainage, or persistent bleeding.  Images permanently stored and available for review in the ultrasound unit.  Impression: Technically successful ultrasound guided injection.  Impression and Recommendations:    Left hip pain Today pain was somewhat proximal to the greater trochanter more over the gluteus medius and minimus tendons near the musculotendinous junction. I injected medication superficial to the gluteus medius, between the gluteus medius, and gluteus  minimus, I placed a bit of medication deep to all of the hip external rotators at the trochanteric bursa. She had 80 to 100% pain relief after the injection.    ___________________________________________ Gwen Her. Dianah Field, M.D., ABFM., CAQSM. Primary Care and Sports Medicine [facility]  Adjunct Professor of [place] of [facility] [employer]

## 2019-02-06 ENCOUNTER — Other Ambulatory Visit: Payer: Self-pay | Admitting: Gastroenterology

## 2019-02-08 ENCOUNTER — Other Ambulatory Visit: Payer: Self-pay

## 2019-02-08 ENCOUNTER — Telehealth: Payer: Self-pay | Admitting: *Deleted

## 2019-02-08 ENCOUNTER — Ambulatory Visit (INDEPENDENT_AMBULATORY_CARE_PROVIDER_SITE_OTHER): Payer: BC Managed Care – PPO

## 2019-02-08 DIAGNOSIS — M25552 Pain in left hip: Secondary | ICD-10-CM

## 2019-02-08 NOTE — Assessment & Plan Note (Signed)
At the last visit we performed a gluteus medius, and gluteus minimus tendon sheath injection with medication additionally placed into the greater trochanteric bursa, she had some good relief immediately after the injection but with a rapid recurrence of pain. For this reason we are going to reevaluate her lumbar spine with an x-ray, MRI. Lumbar spinal stenosis can present similarly.

## 2019-02-08 NOTE — Telephone Encounter (Signed)
At this point we are going to investigate the lumbar spine, adding x-rays and an MRI.

## 2019-02-08 NOTE — Telephone Encounter (Signed)
Pt notified and will come get xray done this afternoon.

## 2019-02-08 NOTE — Telephone Encounter (Signed)
Pt left a vm stating that the hip injection earlier this week has not helped and her pain has actually worsened over the last 2 days.  She wants to know what your thoughts are and what she should do next.  Please advise.

## 2019-02-10 ENCOUNTER — Ambulatory Visit (INDEPENDENT_AMBULATORY_CARE_PROVIDER_SITE_OTHER): Payer: BC Managed Care – PPO

## 2019-02-10 ENCOUNTER — Other Ambulatory Visit: Payer: Self-pay

## 2019-02-10 DIAGNOSIS — M25552 Pain in left hip: Secondary | ICD-10-CM | POA: Diagnosis not present

## 2019-02-11 ENCOUNTER — Ambulatory Visit: Payer: BC Managed Care – PPO | Admitting: Sports Medicine

## 2019-02-19 ENCOUNTER — Telehealth (INDEPENDENT_AMBULATORY_CARE_PROVIDER_SITE_OTHER): Payer: BC Managed Care – PPO | Admitting: Family Medicine

## 2019-02-19 ENCOUNTER — Encounter: Payer: Self-pay | Admitting: Family Medicine

## 2019-02-19 ENCOUNTER — Telehealth: Payer: Self-pay

## 2019-02-19 VITALS — Temp 97.9°F

## 2019-02-19 DIAGNOSIS — K5904 Chronic idiopathic constipation: Secondary | ICD-10-CM | POA: Diagnosis not present

## 2019-02-19 DIAGNOSIS — R232 Flushing: Secondary | ICD-10-CM

## 2019-02-19 DIAGNOSIS — T50905A Adverse effect of unspecified drugs, medicaments and biological substances, initial encounter: Secondary | ICD-10-CM | POA: Insufficient documentation

## 2019-02-19 MED ORDER — OXYBUTYNIN CHLORIDE 5 MG PO TABS: 5.0000 mg | ORAL_TABLET | Freq: Two times a day (BID) | ORAL | 3 refills | Status: DC

## 2019-02-19 NOTE — Telephone Encounter (Signed)
Called and spoke with patient-patient has been scheduled on 03/15/2019 at 3:40 pm; Patient verbalized understanding of information/instructions;  Patient advised to call back to the office should questions/concerns arise;

## 2019-02-19 NOTE — Assessment & Plan Note (Signed)
We discussed options.  For now we will continue with the true Tara Howard I think it is just the addition of the clonidine and that has made it more difficult for her to have a bowel movement though she had noticed a decrease in stooling before actually starting the clonidine.  Okay to continue to add the MiraLAX as needed.  I will reach out to Dr. Bryan Lemma, her GI doc and see if he has any further recommendations

## 2019-02-19 NOTE — Progress Notes (Signed)
Virtual Visit via Video Note  I connected with Tara Howard on 02/19/19 at  8:10 AM EDT by a video enabled telemedicine application and verified that I am speaking with the correct person using two identifiers.   I discussed the limitations of evaluation and management by telemedicine and the availability of in person appointments. The patient expressed understanding and agreed to proceed.    Established Patient Office Visit  Subjective:  Patient ID: Tara Howard, female    DOB: December 25, 1970  Age: 48 y.o. MRN: [id number]  CC: No chief complaint on file.   HPI Tara Howard presents for hot flashes-evidently the tamoxifen has put her into menopause and she is really struggling with hot flashes at night.  To the point where she is getting very little sleep she will just feel hot and cold and especially right now with the high heat index it is been difficult for air conditioning to keep up with cooling her house.  She was recently started on clonidine by her oncologist and says it really has helped with the hot flashes but unfortunately has worsened her chronic constipation.  In regards to that we have tried several medications and a more recently had switched to true [place] which seem to make a big difference when she first started it but now she is back to adding MiraLAX to her regimen and taking some stool softeners about every 3 days to get a good bowel movement.  He wants to know if there are any other recommendations.  Past Medical History:  Diagnosis Date  . 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 Anxiety   . Asthma   . Breast cancer ([place])   . Chronic headache   . Depression   . Fibromyalgia   . Fibromyalgia 2006   pain / headaches  . GERD (gastroesophageal reflux disease)   . IBS (irritable bowel syndrome)     Past Surgical History:  Procedure Laterality Date  . CESAREAN SECTION     X 2  . ENDOMETRIAL ABLATION    . ESOPHAGOGASTRODUODENOSCOPY     said it was done maybe about  10 years ago. In [place]  . TONSILLECTOMY    . TUBAL LIGATION    . UPPER GASTROINTESTINAL ENDOSCOPY      Family History  Problem Relation Age of Onset  . CAD Father        3 vessel bypass  . Depression Mother   . Hypertension Mother   . Heart attack Mother   . CAD Mother        3 vessel bypass  . Irritable bowel syndrome Mother   . Cancer Paternal Grandmother        of the bowel per patient   . Esophageal cancer Paternal Grandmother     Social History   Socioeconomic History  . Marital status: Married    Spouse name: Not on file  . Number of children: 2  . Years of education: Not on file  . Highest education level: Not on file  Occupational History  . 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 Financial resource strain: Not on file  . Food insecurity    Worry: Not on file    Inability: Not on file  . Transportation needs    Medical: Not on file    Non-medical: Not on file  Tobacco Use  . Smoking status: Never Smoker  . Smokeless tobacco:  Never Used  Substance and Sexual Activity  . Alcohol use: No  . Drug use: Never  . Sexual activity: Yes  Lifestyle  . Physical activity    Days per week: Not on file    Minutes per session: Not on file  . Stress: Not on file  Relationships  . Social Herbalist on phone: Not on file    Gets together: Not on file    Attends religious service: Not on file    Active member of club or organization: Not on file    Attends meetings of clubs or organizations: Not on file    Relationship status: Not on file  . Intimate partner violence    Fear of current or ex partner: Not on file    Emotionally abused: Not on file    Physically abused: Not on file    Forced sexual activity: Not on file  Other Topics Concern  . Not on file  Social History Narrative  . Not on file    Outpatient Medications Prior to Visit  Medication Sig Dispense Refill   . azelastine (ASTELIN) 0.1 % nasal spray Place 2 sprays into both nostrils 2 (two) times daily.    . celecoxib (CELEBREX) 100 MG capsule One to 2 tablets by mouth daily as needed for pain. 60 capsule 3  . cetirizine (ZYRTEC) 10 MG tablet Take 10 mg by mouth daily.    . cloNIDine (CATAPRES) 0.1 MG tablet Take one tablet (0.1 mg dose) by mouth at bedtime for 30 days.    Marland Kitchen dexlansoprazole (DEXILANT) 60 MG capsule Take 1 capsule (60 mg total) by mouth daily. 30 capsule 2  . levalbuterol (XOPENEX HFA) 45 MCG/ACT inhaler Inhale 2 puffs into the lungs every 6 (six) hours as needed for wheezing or shortness of breath. Pt will call when needs to filled. 15 g 1  . Multiple Vitamins-Minerals (EQ ONE DAILY WOMENS HEALTH) TABS Take 1 tablet by mouth daily.    Marland Kitchen Plecanatide (TRULANCE) 3 MG TABS Take 1 tablet by mouth daily. 30 tablet 1  . polyethylene glycol (MIRALAX / GLYCOLAX) 17 g packet Take 17 g by mouth daily. Switches between this medication and benefiber    . pregabalin (LYRICA) 150 MG capsule Take 1 capsule (150 mg total) by mouth 2 (two) times daily. 60 capsule 0  . QVAR REDIHALER 80 MCG/ACT inhaler INHALE TWO PUFFS INTO LUNGS TWICE DAILY FOR ASTHMA. USE REGULARLY, AND RINSE MOUTH AFTER USE    . tamoxifen (NOLVADEX) 20 MG tablet Take 20 mg by mouth at bedtime.    . traZODone (DESYREL) 100 MG tablet TAKE 1 & 1/2 TABLETS BY MOUTH AT BEDTIME 135 tablet 3  . venlafaxine (EFFEXOR) 75 MG tablet Take 1 tablet (75 mg total) by mouth daily. 90 tablet 0  . AMITIZA 24 MCG capsule Take 1 capsule (24 mcg total) by mouth 2 (two) times daily with a meal. 60 capsule 3  . fluticasone (FLONASE) 50 MCG/ACT nasal spray Place 1 spray into the nose daily.    . Fluticasone-Salmeterol (ADVAIR DISKUS) 100-50 MCG/DOSE AEPB Inhale 1 puff into the lungs 2 (two) times a day. 60 each 1  . Wheat Dextrin (BENEFIBER DRINK MIX PO) Take by mouth.     No facility-administered medications prior to visit.     Allergies  Allergen  Reactions  . Azithromycin Other (See Comments) and Nausea And Vomiting    Nausea and diarrhea   . Augmentin [Amoxicillin-Pot Clavulanate] Other (See Comments)  Stomach cramps, diarrhea and vomiting  . Flexeril [Cyclobenzaprine] Other (See Comments)    constipation  . Linzess [Linaclotide] Other (See Comments)  . Zolpidem Tartrate Other (See Comments)    Hallucinations    ROS Review of Systems    Objective:    Physical Exam  Constitutional: She is oriented to person, place, and time. She appears well-developed and well-nourished.  HENT:  Head: Normocephalic and atraumatic.  Pulmonary/Chest: Effort normal.  Neurological: She is alert and oriented to person, place, and time.  Psychiatric: She has a normal mood and affect.    Temp 97.9 F (36.6 C)  Wt Readings from Last 3 Encounters:  02/05/19 149 lb (67.6 kg)  01/31/19 149 lb (67.6 kg)  01/29/19 148 lb (67.1 kg)     There are no preventive care reminders to display for this patient.  There are no preventive care reminders to display for this patient.  Lab Results  Component Value Date   TSH 1.235 10/09/2014   Lab Results  Component Value Date   WBC 5.3 10/09/2014   HGB 13.8 10/09/2014   HCT 41.7 10/09/2014   MCV 94.1 10/09/2014   PLT 249 10/09/2014   Lab Results  Component Value Date   NA 140 08/08/2016   NA 140 08/08/2016   K 4.3 08/08/2016   K 4.3 08/08/2016   CO2 27 10/09/2014   GLUCOSE 84 10/09/2014   BUN 12 08/08/2016   BUN 18 08/08/2016   CREATININE 0.7 08/08/2016   CREATININE 0.7 08/08/2016   BILITOT 0.5 10/09/2014   ALKPHOS 51 08/08/2016   AST 15 08/08/2016   AST 15 08/08/2016   ALT 13 08/08/2016   ALT 13 08/08/2016   PROT 6.4 10/09/2014   ALBUMIN 4.3 10/09/2014   CALCIUM 9.2 10/09/2014   Lab Results  Component Value Date   CHOL 146 08/08/2016   CHOL 146 08/08/2016   Lab Results  Component Value Date   HDL 48 08/08/2016   HDL 48 08/08/2016   Lab Results  Component Value Date    LDLCALC 85 08/08/2016   LDLCALC 85 08/08/2016   Lab Results  Component Value Date   TRIG 65 08/08/2016   TRIG 65 08/08/2016   No results found for: CHOLHDL No results found for: HGBA1C    Assessment & Plan:   Problem List Items Addressed This Visit      Cardiovascular and Mediastinum   Hot flash due to medication - Primary    Discussed options.  We could try oxybutynin.  She is already on Lyrica and Effexor both of which should be helping her hot flashes.  And now the clonidine is causing excess constipation which is already complicating her underlying issue.  We will try oxybutynin for 30 days and see if she feels like it is helpful.  I did warn it can also cause constipation but we could only see if it is better than the clonidine.      Relevant Medications   cloNIDine (CATAPRES) 0.1 MG tablet     Other   Constipation    We discussed options.  For now we will continue with the true [place] I think it is just the addition of the clonidine and that has made it more difficult for her to have a bowel movement though she had noticed a decrease in stooling before actually starting the clonidine.  Okay to continue to add the MiraLAX as needed.  I will reach out to Dr. Bryan Lemma, her GI doc and see  if he has any further recommendations         Meds ordered this encounter  Medications  . oxybutynin (DITROPAN) 5 MG tablet    Sig: Take 1 tablet (5 mg total) by mouth 2 (two) times daily.    Dispense:  60 tablet    Refill:  3    Follow-up: No follow-ups on file.    I discussed the assessment and treatment plan with the patient. The patient was provided an opportunity to ask questions and all were answered. The patient agreed with the plan and demonstrated an understanding of the instructions.   The patient was advised to call back or seek an in-person evaluation if the symptoms worsen or if the condition fails to improve as anticipated.    Beatrice Lecher, MD

## 2019-02-19 NOTE — Progress Notes (Signed)
Pt reports hot flashes she was prescribed clonidine for this.  Wanted to speak w/Dr. Madilyn Fireman about taking this along w/the Trulance.Elouise Munroe, [place]

## 2019-02-19 NOTE — Telephone Encounter (Signed)
-----   Message from [place], DO sent at 02/19/2019  2:50 PM EDT ----- Regarding: RE: appt No, routine is fine. Thanks.  ----- Message ----- From: Mohammed Kindle, RN Sent: 02/19/2019   2:49 PM EDT To: Lavena Bullion, DO Subject: appt                                           Are they needing to be scheduled right away because you don't have any openings until 8/21 ish...... Please let me know  ----- Message ----- From: Lavena Bullion, DO Sent: 02/19/2019   2:31 PM EDT To: Mohammed Kindle, RN  Can you schedule a f/u appt for this patient for ongoing tx of IBS-C. Thanks.  ----- Message ----- From: Hali Marry, MD Sent: 02/19/2019  11:06 AM EDT To: Lavena Bullion, DO  Hi Dr. Bryan Lemma, just wanted to get your advice.  Vernica is a mutual patient of ours.  She is tried Act[place] and Linzess.  So I had recently switched her to Trulance which was working well initially but now seems to have slowed down.  So she is gone back to adding MiraLAX and occasional stool softeners.  Just wanted to get your thoughts.  Do you ever go above 3 mg?  Or would you consider Xifaxan at this point?  Any advice you have would be much appreciated.  Thank you.  Dr. Jerilynn Mages

## 2019-02-19 NOTE — Assessment & Plan Note (Signed)
Discussed options.  We could try oxybutynin.  She is already on Lyrica and Effexor both of which should be helping her hot flashes.  And now the clonidine is causing excess constipation which is already complicating her underlying issue.  We will try oxybutynin for 30 days and see if she feels like it is helpful.  I did warn it can also cause constipation but we could only see if it is better than the clonidine.

## 2019-02-25 ENCOUNTER — Telehealth: Payer: Self-pay

## 2019-02-25 NOTE — Telephone Encounter (Signed)
Yes stop oxyburynin and see if rash goes away. Let us know how you are doing in the next few days so we can document intolerance or not.

## 2019-02-25 NOTE — Telephone Encounter (Signed)
Tara Howard started taking Oxybutynin last week. She started having a rash on her chest and it spread to her shoulders. She did go to [facility] and was prescribed prednisone. She still has the rash even on the prednisone. She would like to stop taking the Oxybutynin. Please advise.

## 2019-02-25 NOTE — Telephone Encounter (Signed)
Patient advised.

## 2019-02-28 ENCOUNTER — Ambulatory Visit (INDEPENDENT_AMBULATORY_CARE_PROVIDER_SITE_OTHER): Payer: BC Managed Care – PPO | Admitting: Sports Medicine

## 2019-02-28 ENCOUNTER — Encounter: Payer: Self-pay | Admitting: Sports Medicine

## 2019-02-28 ENCOUNTER — Other Ambulatory Visit: Payer: Self-pay | Admitting: Family Medicine

## 2019-02-28 ENCOUNTER — Other Ambulatory Visit: Payer: Self-pay

## 2019-02-28 DIAGNOSIS — M25552 Pain in left hip: Secondary | ICD-10-CM | POA: Diagnosis not present

## 2019-02-28 NOTE — Progress Notes (Signed)
Subjective:    CC: Follow-up  HPI: This is a pleasant 48 year old female, at the last visit we did gluteus medius and minimus tendon sheath injections, she returns today pain-free.  I reviewed the past medical history, family history, social history, surgical history, and allergies today and no changes were needed.  Please see the problem list section below in epic for further details.  Past Medical History: Past Medical History:  Diagnosis Date  . 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 Anxiety   . Asthma   . Breast cancer ([place])   . Chronic headache   . Depression   . Fibromyalgia   . Fibromyalgia 2006   pain / headaches  . GERD (gastroesophageal reflux disease)   . IBS (irritable bowel syndrome)    Past Surgical History: Past Surgical History:  Procedure Laterality Date  . CESAREAN SECTION     X 2  . ENDOMETRIAL ABLATION    . ESOPHAGOGASTRODUODENOSCOPY     said it was done maybe about 10 years ago. In [place]  . TONSILLECTOMY    . TUBAL LIGATION    . UPPER GASTROINTESTINAL ENDOSCOPY     Social History: Social History   Socioeconomic History  . Marital status: Married    Spouse name: Not on file  . Number of children: 2  . Years of education: Not on file  . Highest education level: Not on file  Occupational History  . 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 Financial resource strain: Not on file  . Food insecurity    Worry: Not on file    Inability: Not on file  . Transportation needs    Medical: Not on file    Non-medical: Not on file  Tobacco Use  . Smoking status: Never Smoker  . Smokeless tobacco: Never Used  Substance and Sexual Activity  . Alcohol use: No  . Drug use: Never  . Sexual activity: Yes  Lifestyle  . Physical activity    Days per week: Not on file    Minutes per session: Not on file  . Stress: Not on file  Relationships  . Social Herbalist on  phone: Not on file    Gets together: Not on file    Attends religious service: Not on file    Active member of club or organization: Not on file    Attends meetings of clubs or organizations: Not on file    Relationship status: Not on file  Other Topics Concern  . Not on file  Social History Narrative  . Not on file   Family History: Family History  Problem Relation Age of Onset  . CAD Father        3 vessel bypass  . Depression Mother   . Hypertension Mother   . Heart attack Mother   . CAD Mother        3 vessel bypass  . Irritable bowel syndrome Mother   . Cancer Paternal Grandmother        of the bowel per patient   . Esophageal cancer Paternal Grandmother    Allergies: Allergies  Allergen Reactions  . Azithromycin Other (See Comments) and Nausea And Vomiting    Nausea and diarrhea   . Augmentin [Amoxicillin-Pot Clavulanate] Other (See Comments)    Stomach cramps, diarrhea and vomiting  . Flexeril [Cyclobenzaprine] Other (See Comments)    constipation  .  Linzess [Linaclotide] Other (See Comments)  . Zolpidem Tartrate Other (See Comments)    Hallucinations   Medications: See med rec.  Review of Systems: No fevers, chills, night sweats, weight loss, chest pain, or shortness of breath.   Objective:    General: Well Developed, well nourished, and in no acute distress.  Neuro: Alert and oriented x3, extra-ocular muscles intact, sensation grossly intact.  HEENT: Normocephalic, atraumatic, pupils equal round reactive to light, neck supple, no masses, no lymphadenopathy, thyroid nonpalpable.  Skin: Warm and dry, no rashes. Cardiac: Regular rate and rhythm, no murmurs rubs or gallops, no lower extremity edema.  Respiratory: Clear to auscultation bilaterally. Not using accessory muscles, speaking in full sentences.  Impression and Recommendations:    Left hip pain Symptoms are completely resolved now after gluteus medius and gluteus minimus tendon sheath injections.  I also placed the medication to the greater trochanteric bursa. It took a few days but her pain is now gone, she did have a bit of lumbar DDD at L5-S1 that I think is not clinically significant at this point. She will work aggressively on core strength, as well as hip abductor strengthening rather than flexibility. Return to see me as needed.   ___________________________________________ Gwen Her. Dianah Field, M.D., ABFM., CAQSM. Primary Care and Sports Medicine [facility]  Adjunct Professor of [employer]

## 2019-02-28 NOTE — Assessment & Plan Note (Signed)
Symptoms are completely resolved now after gluteus medius and gluteus minimus tendon sheath injections. I also placed the medication to the greater trochanteric bursa. It took a few days but her pain is now gone, she did have a bit of lumbar DDD at L5-S1 that I think is not clinically significant at this point. She will work aggressively on core strength, as well as hip abductor strengthening rather than flexibility. Return to see me as needed.

## 2019-03-12 ENCOUNTER — Other Ambulatory Visit: Payer: Self-pay | Admitting: Family Medicine

## 2019-03-12 DIAGNOSIS — K5900 Constipation, unspecified: Secondary | ICD-10-CM

## 2019-03-13 ENCOUNTER — Telehealth: Payer: Self-pay

## 2019-03-13 NOTE — Telephone Encounter (Signed)
Covid-19 screening questions   Do you now or have you had a fever in the last 14 days? No  Do you have any respiratory symptoms of shortness of breath or cough now or in the last 14 days? No  Do you have any family members or close contacts with diagnosed or suspected Covid-19 in the past 14 days? No  Have you been tested for Covid-19 and found to be positive? No        

## 2019-03-15 ENCOUNTER — Ambulatory Visit (INDEPENDENT_AMBULATORY_CARE_PROVIDER_SITE_OTHER): Payer: BC Managed Care – PPO | Admitting: Gastroenterology

## 2019-03-15 ENCOUNTER — Encounter: Payer: Self-pay | Admitting: Gastroenterology

## 2019-03-15 ENCOUNTER — Other Ambulatory Visit: Payer: Self-pay

## 2019-03-15 VITALS — BP 120/80 | HR 86 | Temp 98.3°F | Ht 64.0 in | Wt 154.1 lb

## 2019-03-15 DIAGNOSIS — K5900 Constipation, unspecified: Secondary | ICD-10-CM | POA: Diagnosis not present

## 2019-03-15 DIAGNOSIS — K581 Irritable bowel syndrome with constipation: Secondary | ICD-10-CM | POA: Diagnosis not present

## 2019-03-15 DIAGNOSIS — K219 Gastro-esophageal reflux disease without esophagitis: Secondary | ICD-10-CM | POA: Diagnosis not present

## 2019-03-15 DIAGNOSIS — R1084 Generalized abdominal pain: Secondary | ICD-10-CM

## 2019-03-15 NOTE — H&P (View-Only) (Signed)
P  Chief Complaint:    GERD, Constipation  GI History: 48 year old female with history of Breast CA (diagnosed 2020 now s/p mastectomy for cure), IBS-C, GERD.  1) Hx of IBS-C.  Index sxs were constipation predominant. Has a long hx of previously diagnosed IBS-C, which was well controlled with dietary mods, high fiber diet, and Miralax. Intermittent generalized abdominal cramping and abdominal bloating w/ visible abdominal distension. No n/v/f/c. Previously tried Pulte Homes, and stopped due to diarrhea and abdominal pain after a few days.  Trialed Amitiza in 08/2018, but not effective (although may have only tried the 8 MCG dose).  Started on Trulance, which initially worked, but has since reverted back to constipation.  Otherwise, no hematochezia, melena.   2) Hx of GERD, treated with Nexium for years, daily with occassional need for 2nd dose for breakthrough sxs. Index sxs of HB and regurgitation, btu has been worsening lately with increasing breakthrough sxs. . EGD in 2012 was only n/f gastritis and esophagitis per patient. No dysphagia or odynophagia, and no early satiety.   For ongoing reflux, started on Dexilant, which was initially quite effective, but seems to have lost efficacy with breakthrough 3 days/week.  EGD essentially normal in 08/2018.  Endoscopic Hx: - EGD (09/2010): Erythema in antrum compatible with gastritis. Otherwise normal. No actual report in EMR for review.  - EGD (08/2018, Dr. Bryan Lemma): Normal -Colonoscopy (08/2018, Dr. Bryan Lemma): 2 small benign polyps in transverse colon, suboptimal prep.  Biopsies negative for [facility]   HPI:    Patient is a 48 y.o. female presenting to the [facility]astroenterology Clinic for follow-up.  Initially seen by me in 08/2018 with IBS-C, change in bowel habits, GERD.  EGD and colonoscopy completed and largely unrevealing.  Started on Trulance.  Was seen by Dr. Charise Carwin in 01/2019, with ongoing constipation.  Suspicion that the addition of clonidine has  made BM more difficult.  Added MiraLAX.  Today she states the Trulance is no longer working and with increased flatus but back to constipation. Started in March, Only worked for a few weeks. Now will only have BM with Mag Citrate or stool softener- takes 1-2 times per week depending on abdominal discomfort.  Dexilant is better than the OTC Nexium, but again having breakthough reflux symptoms requring Tums 3 days/week.   Has been taking venlafaxine since Feb.   Review of systems:     No chest pain, no SOB, no fevers, no urinary sx   Past Medical History:  Diagnosis Date  . 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 Anxiety   . Asthma   . Breast cancer ([place])   . Chronic headache   . Depression   . Fibromyalgia   . Fibromyalgia 2006   pain / headaches  . GERD (gastroesophageal reflux disease)   . IBS (irritable bowel syndrome)     Patient's surgical history, family medical history, social history, medications and allergies were all reviewed in Epic    Current Outpatient Medications  Medication Sig Dispense Refill  . celecoxib (CELEBREX) 100 MG capsule One to 2 tablets by mouth daily as needed for pain. 60 capsule 3  . cetirizine (ZYRTEC) 10 MG tablet Take 10 mg by mouth daily.    Marland Kitchen dexlansoprazole (DEXILANT) 60 MG capsule Take 1 capsule (60 mg total) by mouth daily. 30 capsule 2  . levalbuterol (XOPENEX HFA) 45 MCG/ACT inhaler Inhale 2 puffs into the lungs every 6 (six) hours as needed for wheezing or shortness of breath. Pt will  call when needs to filled. 15 g 1  . Multiple Vitamins-Minerals (EQ ONE DAILY WOMENS HEALTH) TABS Take 1 tablet by mouth daily.    . polyethylene glycol (MIRALAX / GLYCOLAX) 17 g packet Take 17 g by mouth daily. Switches between this medication and benefiber    . pregabalin (LYRICA) 150 MG capsule Take 1 capsule (150 mg total) by mouth 2 (two) times daily. 60 capsule 0  . QVAR REDIHALER 80 MCG/ACT inhaler INHALE TWO PUFFS INTO LUNGS TWICE DAILY FOR ASTHMA. USE  REGULARLY, AND RINSE MOUTH AFTER USE    . tamoxifen (NOLVADEX) 20 MG tablet Take 20 mg by mouth at bedtime.    . traZODone (DESYREL) 100 MG tablet TAKE 1 & 1/2 TABLETS BY MOUTH AT BEDTIME 135 tablet 3  . TRULANCE 3 MG TABS Take 1 tablet by mouth daily. 30 tablet 5  . venlafaxine (EFFEXOR) 75 MG tablet Take 1 tablet (75 mg total) by mouth daily. 90 tablet 0   No current facility-administered medications for this visit.     Physical Exam:     BP 120/80   Pulse 86   Temp 98.3 F (36.8 C)   Ht 5\' 4"  (1.626 m)   Wt 154 lb 2 oz (69.9 kg)   BMI 26.46 kg/m   GENERAL:  Pleasant female in NAD PSYCH: : Cooperative, normal affect EENT:  conjunctiva pink, mucous membranes moist, neck supple without masses CARDIAC:  RRR, no murmur heard, no peripheral edema PULM: Normal respiratory effort, lungs CTA bilaterally, no wheezing ABDOMEN:  Nondistended, soft, nontender. No obvious masses, no hepatomegaly,  normal bowel sounds SKIN:  turgor, no lesions seen Musculoskeletal:  Normal muscle tone, normal strength NEURO: Alert and oriented x 3, no focal neurologic deficits   IMPRESSION and PLAN:     1) GERD: - Okay to resume Dexilant for now -Okay to resume on-demand antacids - Discussed pH/impedance versus Bravo.  She would like to proceed with Bravo to evaluate further -Schedule EGD with Bravo at [facility].  To be done off PPI x5 days - Already taking venlafaxine, which would have neuro modulating effect if esophageal hypersensitivity -Briefly discussed the role of TIF.  Can discuss in further detail following Bravo results  2) IBS-C: 3) Generalized abdominal pain -Now having to rely on mag citrate or stool softeners to have BM - Trulance seemingly ineffective.  Stopping today - Further evaluate with Sitz marker study -Discussed the role of ARM pending results of study -May consider trial of Motegrity - Continue adequate fluid hydration -Continue high-fiber diet - Following completion  of Sitz marker study, plan to start MiraLAX at bid and titrate to regular, soft stools.  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  Undoubtedly, this is worsening her IBS-C.  Discussed this at length today -Already taking venlafaxine -Low FODMAP diet -Agree that this could be worsened by clonidine (prescribed by Oncologist for hot flashes)  RTC after studies complete  The indications, risks, and benefits of EGD with Bravo were explained to the patient in detail. Risks include but are not limited to bleeding, perforation, adverse reaction to medications, and cardiopulmonary compromise. Sequelae include but are not limited to the possibility of surgery, hositalization, and mortality. The patient  verbalized understanding and wished to proceed. All questions answered, referred to scheduler. Further recommendations pending results of the exam.        Lavena Bullion ,DO, FACG 03/15/2019, 4:15 PM  CC: Hali Marry, MD

## 2019-03-15 NOTE — Patient Instructions (Signed)
Do not take any anti-diarrheals 5 days before swallowing the sitz marker capsule and 5 days after swallowing ( total of 10 days).  After swallowing the capsule wait 5 days and then come to X Ray at this location [facility]) for an x ray.  You do not have to schedule an appointment.  We will call you to schedule the endoscopy

## 2019-03-15 NOTE — Progress Notes (Signed)
P  Chief Complaint:    GERD, Constipation  GI History: 48 year old female with history of Breast CA (diagnosed 2020 now s/p mastectomy for cure), IBS-C, GERD.  1) Hx of IBS-C.  Index sxs were constipation predominant. Has a long hx of previously diagnosed IBS-C, which was well controlled with dietary mods, high fiber diet, and Miralax. Intermittent generalized abdominal cramping and abdominal bloating w/ visible abdominal distension. No n/v/f/c. Previously tried Pulte [employer], and stopped due to diarrhea and abdominal pain after a few days.  Trialed Amitiza in 08/2018, but not effective (although may have only tried the 8 MCG dose).  Started on Trulance, which initially worked, but has since reverted back to constipation.  Otherwise, no hematochezia, melena.   2) Hx of GERD, treated with Nexium for years, daily with occassional need for 2nd dose for breakthrough sxs. Index sxs of HB and regurgitation, btu has been worsening lately with increasing breakthrough sxs. . EGD in 2012 was only n/f gastritis and esophagitis per patient. No dysphagia or odynophagia, and no early satiety.   For ongoing reflux, started on Dexilant, which was initially quite effective, but seems to have lost efficacy with breakthrough 3 days/week.  EGD essentially normal in 08/2018.  Endoscopic Hx: - EGD (09/2010): Erythema in antrum compatible with gastritis. Otherwise normal. No actual report in EMR for review.  - EGD (08/2018, Dr. Bryan Lemma): Normal -Colonoscopy (08/2018, Dr. Bryan Lemma): 2 small benign polyps in transverse colon, suboptimal prep.  Biopsies negative for [facility]   HPI:    Patient is a 48 y.o. female presenting to the Gastroenterology Clinic for follow-up.  Initially seen by me in 08/2018 with IBS-C, change in bowel habits, GERD.  EGD and colonoscopy completed and largely unrevealing.  Started on Trulance.  Was seen by Dr. Charise Carwin in 01/2019, with ongoing constipation.  Suspicion that the addition of clonidine has  made BM more difficult.  Added MiraLAX.  Today she states the Trulance is no longer working and with increased flatus but back to constipation. Started in March, Only worked for a few weeks. Now will only have BM with Mag Citrate or stool softener- takes 1-2 times per week depending on abdominal discomfort.  Dexilant is better than the OTC Nexium, but again having breakthough reflux symptoms requring Tums 3 days/week.   Has been taking venlafaxine since Feb.   Review of systems:     No chest pain, no SOB, no fevers, no urinary sx   Past Medical History:  Diagnosis Date  . 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 Anxiety   . Asthma   . Breast cancer ([place])   . Chronic headache   . Depression   . Fibromyalgia   . Fibromyalgia 2006   pain / headaches  . GERD (gastroesophageal reflux disease)   . IBS (irritable bowel syndrome)     Patient's surgical history, family medical history, social history, medications and allergies were all reviewed in Epic    Current Outpatient Medications  Medication Sig Dispense Refill  . celecoxib (CELEBREX) 100 MG capsule One to 2 tablets by mouth daily as needed for pain. 60 capsule 3  . cetirizine (ZYRTEC) 10 MG tablet Take 10 mg by mouth daily.    Marland Kitchen dexlansoprazole (DEXILANT) 60 MG capsule Take 1 capsule (60 mg total) by mouth daily. 30 capsule 2  . levalbuterol (XOPENEX HFA) 45 MCG/ACT inhaler Inhale 2 puffs into the lungs every 6 (six) hours as needed for wheezing or shortness of breath. Pt will  call when needs to filled. 15 g 1  . Multiple Vitamins-Minerals (EQ ONE DAILY WOMENS HEALTH) TABS Take 1 tablet by mouth daily.    . polyethylene glycol (MIRALAX / GLYCOLAX) 17 g packet Take 17 g by mouth daily. Switches between this medication and benefiber    . pregabalin (LYRICA) 150 MG capsule Take 1 capsule (150 mg total) by mouth 2 (two) times daily. 60 capsule 0  . QVAR REDIHALER 80 MCG/ACT inhaler INHALE TWO PUFFS INTO LUNGS TWICE DAILY FOR ASTHMA. USE  REGULARLY, AND RINSE MOUTH AFTER USE    . tamoxifen (NOLVADEX) 20 MG tablet Take 20 mg by mouth at bedtime.    . traZODone (DESYREL) 100 MG tablet TAKE 1 & 1/2 TABLETS BY MOUTH AT BEDTIME 135 tablet 3  . TRULANCE 3 MG TABS Take 1 tablet by mouth daily. 30 tablet 5  . venlafaxine (EFFEXOR) 75 MG tablet Take 1 tablet (75 mg total) by mouth daily. 90 tablet 0   No current facility-administered medications for this visit.     Physical Exam:     BP 120/80   Pulse 86   Temp 98.3 F (36.8 C)   Ht 5\' 4"  (1.626 m)   Wt 154 lb 2 oz (69.9 kg)   BMI 26.46 kg/m   GENERAL:  Pleasant female in NAD PSYCH: : Cooperative, normal affect EENT:  conjunctiva pink, mucous membranes moist, neck supple without masses CARDIAC:  RRR, no murmur heard, no peripheral edema PULM: Normal respiratory effort, lungs CTA bilaterally, no wheezing ABDOMEN:  Nondistended, soft, nontender. No obvious masses, no hepatomegaly,  normal bowel sounds SKIN:  turgor, no lesions seen Musculoskeletal:  Normal muscle tone, normal strength NEURO: Alert and oriented x 3, no focal neurologic deficits   IMPRESSION and PLAN:     1) GERD: - Okay to resume Dexilant for now -Okay to resume on-demand antacids - Discussed pH/impedance versus Bravo.  She would like to proceed with Bravo to evaluate further -Schedule EGD with Bravo at [facility].  To be done off PPI x5 days - Already taking venlafaxine, which would have neuro modulating effect if esophageal hypersensitivity -Briefly discussed the role of TIF.  Can discuss in further detail following Bravo results  2) IBS-C: 3) Generalized abdominal pain -Now having to rely on mag citrate or stool softeners to have BM - Trulance seemingly ineffective.  Stopping today - Further evaluate with Sitz marker study -Discussed the role of ARM pending results of study -May consider trial of Motegrity - Continue adequate fluid hydration -Continue high-fiber diet - Following completion  of Sitz marker study, plan to start MiraLAX at bid and titrate to regular, soft stools.  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  Undoubtedly, this is worsening her IBS-C.  Discussed this at length today -Already taking venlafaxine -Low FODMAP diet -Agree that this could be worsened by clonidine (prescribed by Oncologist for hot flashes)  RTC after studies complete  The indications, risks, and benefits of EGD with Bravo were explained to the patient in detail. Risks include but are not limited to bleeding, perforation, adverse reaction to medications, and cardiopulmonary compromise. Sequelae include but are not limited to the possibility of surgery, hositalization, and mortality. The patient  verbalized understanding and wished to proceed. All questions answered, referred to scheduler. Further recommendations pending results of the exam.        Lavena Bullion ,DO, FACG 03/15/2019, 4:15 PM  CC: Hali Marry, MD

## 2019-03-19 ENCOUNTER — Telehealth: Payer: Self-pay

## 2019-03-19 ENCOUNTER — Other Ambulatory Visit: Payer: Self-pay

## 2019-03-19 DIAGNOSIS — K5900 Constipation, unspecified: Secondary | ICD-10-CM

## 2019-03-19 DIAGNOSIS — R1084 Generalized abdominal pain: Secondary | ICD-10-CM

## 2019-03-19 DIAGNOSIS — K219 Gastro-esophageal reflux disease without esophagitis: Secondary | ICD-10-CM

## 2019-03-19 DIAGNOSIS — K581 Irritable bowel syndrome with constipation: Secondary | ICD-10-CM

## 2019-03-19 NOTE — Telephone Encounter (Signed)
Called and spoke with patient- patient advised that she is going to be scheduled for an EGD with BRAVO placement at [facility] on 03/26/2019 at 1:15pm; patient is agreeable to plan of care; patient has been mailed instructions for her EGD at [facility] on 03/19/2019, patient is also aware she can access these instructions via [place]; patient verbalized understanding of information/instructions; patient advised to call back to the office should any questions/concerns arise;

## 2019-03-19 NOTE — Telephone Encounter (Signed)
Called patient and LVM to see when she took her sitz marker because 5 days later she is suppose to have her imaging done

## 2019-03-20 ENCOUNTER — Ambulatory Visit (INDEPENDENT_AMBULATORY_CARE_PROVIDER_SITE_OTHER): Payer: BC Managed Care – PPO

## 2019-03-20 ENCOUNTER — Other Ambulatory Visit: Payer: Self-pay

## 2019-03-20 DIAGNOSIS — K219 Gastro-esophageal reflux disease without esophagitis: Secondary | ICD-10-CM | POA: Diagnosis not present

## 2019-03-20 DIAGNOSIS — K5900 Constipation, unspecified: Secondary | ICD-10-CM | POA: Diagnosis not present

## 2019-03-20 DIAGNOSIS — K581 Irritable bowel syndrome with constipation: Secondary | ICD-10-CM | POA: Diagnosis not present

## 2019-03-21 NOTE — Telephone Encounter (Addendum)
Relayed information to the patient; ---- Message from Lavena Bullion, DO sent at 03/21/2019  8:14 AM EDT ----- Sitz marker study demonstrates stool in the right colon (constipation), but no retained markers.  This demonstrates normal colonic motility.  Plan to start MiraLAX bid for goal of soft, regular stools, then can titrate down as this becomes effective.  Depending on response to MiraLAX, other treatment options include going back to the Amitiza, but at a higher dose, or a trial of Motegrity.

## 2019-03-21 NOTE — Progress Notes (Unsigned)
Patient has been informed of the results from her Sitz marker study

## 2019-03-22 ENCOUNTER — Other Ambulatory Visit: Payer: Self-pay

## 2019-03-22 ENCOUNTER — Other Ambulatory Visit (HOSPITAL_COMMUNITY)
Admission: RE | Admit: 2019-03-22 | Discharge: 2019-03-22 | Disposition: A | Discharge status: Home / Self Care | Payer: BC Managed Care – PPO | Source: Ambulatory Visit | Attending: Gastroenterology | Admitting: Gastroenterology

## 2019-03-22 DIAGNOSIS — Z20828 Contact with and (suspected) exposure to other viral communicable diseases: Secondary | ICD-10-CM | POA: Diagnosis not present

## 2019-03-22 DIAGNOSIS — Z01812 Encounter for preprocedural laboratory examination: Secondary | ICD-10-CM | POA: Insufficient documentation

## 2019-03-22 LAB — SARS CORONAVIRUS 2 (TAT 6-24 HRS): SARS Coronavirus 2: NEGATIVE

## 2019-03-22 MED ORDER — MOTEGRITY 2 MG PO TABS: 2.0000 mg | ORAL_TABLET | Freq: Every day | ORAL | 3 refills | Status: DC

## 2019-03-22 NOTE — Progress Notes (Signed)
Samples sent to front desk for patient to pick up per MD recommendations;

## 2019-03-25 ENCOUNTER — Other Ambulatory Visit: Payer: Self-pay

## 2019-03-25 ENCOUNTER — Encounter (HOSPITAL_COMMUNITY): Payer: Self-pay | Admitting: *Deleted

## 2019-03-26 ENCOUNTER — Encounter (HOSPITAL_COMMUNITY)
Admission: RE | Disposition: A | Discharge status: Home / Self Care | Payer: Self-pay | Source: Home / Self Care | Attending: Gastroenterology

## 2019-03-26 ENCOUNTER — Ambulatory Visit (HOSPITAL_COMMUNITY): Payer: BC Managed Care – PPO | Admitting: Anesthesiology

## 2019-03-26 ENCOUNTER — Encounter (HOSPITAL_COMMUNITY): Payer: Self-pay | Admitting: *Deleted

## 2019-03-26 ENCOUNTER — Ambulatory Visit (HOSPITAL_COMMUNITY)
Admission: RE | Admit: 2019-03-26 | Discharge: 2019-03-26 | Disposition: A | Discharge status: Home / Self Care | Payer: BC Managed Care – PPO | Attending: Gastroenterology | Admitting: Gastroenterology

## 2019-03-26 ENCOUNTER — Other Ambulatory Visit: Payer: Self-pay

## 2019-03-26 DIAGNOSIS — K219 Gastro-esophageal reflux disease without esophagitis: Secondary | ICD-10-CM | POA: Diagnosis present

## 2019-03-26 DIAGNOSIS — K5900 Constipation, unspecified: Secondary | ICD-10-CM | POA: Insufficient documentation

## 2019-03-26 DIAGNOSIS — R12 Heartburn: Secondary | ICD-10-CM

## 2019-03-26 DIAGNOSIS — M797 Fibromyalgia: Secondary | ICD-10-CM | POA: Diagnosis not present

## 2019-03-26 DIAGNOSIS — Z853 Personal history of malignant neoplasm of breast: Secondary | ICD-10-CM | POA: Diagnosis not present

## 2019-03-26 DIAGNOSIS — K581 Irritable bowel syndrome with constipation: Secondary | ICD-10-CM

## 2019-03-26 DIAGNOSIS — J45909 Unspecified asthma, uncomplicated: Secondary | ICD-10-CM | POA: Diagnosis not present

## 2019-03-26 DIAGNOSIS — Q402 Other specified congenital malformations of stomach: Secondary | ICD-10-CM

## 2019-03-26 DIAGNOSIS — R1084 Generalized abdominal pain: Secondary | ICD-10-CM

## 2019-03-26 DIAGNOSIS — F419 Anxiety disorder, unspecified: Secondary | ICD-10-CM | POA: Insufficient documentation

## 2019-03-26 HISTORY — PX: ESOPHAGOGASTRODUODENOSCOPY (EGD) WITH PROPOFOL: SHX5813

## 2019-03-26 HISTORY — PX: BRAVO PH STUDY: SHX5421

## 2019-03-26 SURGERY — ESOPHAGOGASTRODUODENOSCOPY (EGD) WITH PROPOFOL
Anesthesia: Monitor Anesthesia Care

## 2019-03-26 MED ORDER — LACTATED RINGERS IV SOLN
INTRAVENOUS | Status: DC
  Administered 2019-03-26: 14:00:00 1000 mL via INTRAVENOUS

## 2019-03-26 MED ORDER — SODIUM CHLORIDE 0.9 % IV SOLN: INTRAVENOUS | Status: DC

## 2019-03-26 MED ORDER — PROPOFOL 500 MG/50ML IV EMUL
INTRAVENOUS | Status: DC | PRN
  Administered 2019-03-26 (×2): 20 mg via INTRAVENOUS
  Administered 2019-03-26: 50 mg via INTRAVENOUS

## 2019-03-26 MED ORDER — PROPOFOL 500 MG/50ML IV EMUL
INTRAVENOUS | Status: DC | PRN
  Administered 2019-03-26: 125 ug/kg/min via INTRAVENOUS

## 2019-03-26 MED ORDER — PROPOFOL 10 MG/ML IV BOLUS
INTRAVENOUS | Status: AC
  Filled 2019-03-26: qty 20

## 2019-03-26 MED ORDER — PROPOFOL 10 MG/ML IV BOLUS
INTRAVENOUS | Status: AC
  Filled 2019-03-26: qty 40

## 2019-03-26 SURGICAL SUPPLY — 15 items

## 2019-03-26 NOTE — Discharge Instructions (Signed)
YOU HAD AN ENDOSCOPIC PROCEDURE TODAY: Refer to the procedure report and other information in the discharge instructions given to you for any specific questions about what was found during the examination. If this information does not answer your questions, please call [place] office at [phone] to clarify.  ° °YOU SHOULD EXPECT: Some feelings of bloating in the abdomen. Passage of more gas than usual. Walking can help get rid of the air that was put into your GI tract during the procedure and reduce the bloating. If you had a lower endoscopy (such as a colonoscopy or flexible sigmoidoscopy) you may notice spotting of blood in your stool or on the toilet paper. Some abdominal soreness may be present for a day or two, also. ° °DIET: Your first meal following the procedure should be a light meal and then it is ok to progress to your normal diet. A half-sandwich or bowl of soup is an example of a good first meal. Heavy or fried foods are harder to digest and may make you feel nauseous or bloated. Drink plenty of fluids but you should avoid alcoholic beverages for 24 hours. If you had a esophageal dilation, please see attached instructions for diet.   ° °ACTIVITY: Your care partner should take you home directly after the procedure. You should plan to take it easy, moving slowly for the rest of the day. You can resume normal activity the day after the procedure however YOU SHOULD NOT DRIVE, use power tools, machinery or perform tasks that involve climbing or major physical exertion for 24 hours (because of the sedation medicines used during the test).  ° °SYMPTOMS TO REPORT IMMEDIATELY: °A gastroenterologist can be reached at any hour. Please call [phone]  for any of the following symptoms:  °Following lower endoscopy (colonoscopy, flexible sigmoidoscopy) °Excessive amounts of blood in the stool  °Significant tenderness, worsening of abdominal pains  °Swelling of the abdomen that is new, acute  °Fever of 100° or  higher  °Following upper endoscopy (EGD, EUS, ERCP, esophageal dilation) °Vomiting of blood or coffee ground material  °New, significant abdominal pain  °New, significant chest pain or pain under the shoulder blades  °Painful or persistently difficult swallowing  °New shortness of breath  °Black, tarry-looking or red, bloody stools ° °FOLLOW UP:  °If any biopsies were taken you will be contacted by phone or by letter within the next 1-3 weeks. Call [phone]  if you have not heard about the biopsies in 3 weeks.  °Please also call with any specific questions about appointments or follow up tests. ° °

## 2019-03-26 NOTE — Anesthesia Procedure Notes (Signed)
Date/Time: 03/26/2019 3:03 PM Performed by: Glory Buff, CRNA Oxygen Delivery Method: Nasal cannula

## 2019-03-26 NOTE — Progress Notes (Signed)
While pt was in recovery after an EGD with pH Bravo placement the RN attempted to start study on the Bravo Monitor. Bravo monitor unable to connect and start study with Bravo Capsule. Dr Bryan Lemma and pt notified. Jobe Igo, RN

## 2019-03-26 NOTE — Transfer of Care (Signed)
Immediate Anesthesia Transfer of Care Note  Patient: Tara Howard  Procedure(s) Performed: ESOPHAGOGASTRODUODENOSCOPY (EGD) WITH PROPOFOL (N/A ) BRAVO PH STUDY (N/A )  Patient Location: PACU  Anesthesia Type:MAC  Level of Consciousness: awake, alert  and oriented  Airway & Oxygen Therapy: Patient Spontanous Breathing and Patient connected to nasal cannula oxygen  Post-op Assessment: Report given to RN and Post -op Vital signs reviewed and stable  Post vital signs: Reviewed and stable  Last Vitals:  Vitals Value Taken Time  BP    Temp    Pulse 73 03/26/19 1526  Resp 16 03/26/19 1526  SpO2 100 % 03/26/19 1526  Vitals shown include unvalidated device data.  Last Pain:  Vitals:   03/26/19 1319  TempSrc: Oral  PainSc: 0-No pain         Complications: No apparent anesthesia complications

## 2019-03-26 NOTE — Interval H&P Note (Signed)
History and Physical Interval Note:  03/26/2019 1:04 PM  Tara Howard  has presented today for surgery, with the diagnosis of GERD/IBS-C/constipation.  The various methods of treatment have been discussed with the patient and family. After consideration of risks, benefits and other options for treatment, the patient has consented to  Procedure(s): ESOPHAGOGASTRODUODENOSCOPY (EGD) WITH PROPOFOL (N/A) BRAVO PH STUDY (N/A) as a surgical intervention.  The patient's history has been reviewed, patient examined, no change in status, stable for surgery.  I have reviewed the patient's chart and labs.  Questions were answered to the patient's satisfaction.     Dominic Pea [name]

## 2019-03-26 NOTE — Op Note (Signed)
[facility] Patient Name: Tara Howard Procedure Date: 03/26/2019 MRN: [id number] Attending MD: Gerrit Heck , MD Date of Birth: [birth year] CSN: [id number] Age: 48 Admit Type: Outpatient Procedure:                Upper GI endoscopy Indications:              Heartburn, Bravo placement Providers:                Gerrit Heck, MD, Baird Cancer, RN, Marguerita Merles, Technician, William Dalton, Technician Referring MD:              Medicines:                Monitored Anesthesia Care Complications:            No immediate complications. Estimated Blood Loss:     Estimated blood loss: none. Procedure:                Pre-Anesthesia Assessment:                           - Prior to the procedure, a History and Physical                            was performed, and patient medications and                            allergies were reviewed. The patient's tolerance of                            previous anesthesia was also reviewed. The risks                            and benefits of the procedure and the sedation                            options and risks were discussed with the patient.                            All questions were answered, and informed consent                            was obtained. Prior Anticoagulants: The patient has                            taken no previous anticoagulant or antiplatelet                            agents. ASA Grade Assessment: II - A patient with                            mild systemic disease. After reviewing the risks  and benefits, the patient was deemed in                            satisfactory condition to undergo the procedure.                           After obtaining informed consent, the endoscope was                            passed under direct vision. Throughout the                            procedure, the patient's blood pressure, pulse, and              oxygen saturations were monitored continuously. The                            [id number]-[id number] Olympus gastroscope was                            introduced through the mouth, and advanced to the                            second part of duodenum. The upper GI endoscopy was                            accomplished without difficulty. The patient                            tolerated the procedure well. 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      The Z-line was regular and was found 35 cm from the incisors.      The middle third of the esophagus and lower third of the esophagus were       normal. The BRAVO capsule with delivery system was introduced through       the mouth and advanced into the esophagus, such that the BRAVO pH       capsule was positioned 30 cm from the incisors, which was 5 cm proximal       to the GE junction. Suction was applied to the well of the BRAVO pH       capsule to suck in the adjacent mucosa of the esophagus using the       external vacuum pump. The BRAVO pH capsule was then deployed by       depressing the plunger on top of the handle to advance the locking pin       into the mucosa, thereby attaching the capsule to the esophagus. The       plunger was then rotated a quarter turn clockwise to release the capsule       from the delivery system. The delivery system was then withdrawn.       Endoscopy was utilized for probe placement and diagnostic evaluation.      The gastroesophageal flap valve was visualized endoscopically and  classified as Hill Grade II (fold present, opens with respiration).      The entire examined stomach was normal.      The duodenal bulb, first portion of the duodenum and second portion of       the duodenum were normal. Impression:               - Ectopic gastric mucosa in the upper third of the                             esophagus.                           - Z-line regular, 35 cm from the incisors.                           - Normal middle third of esophagus and lower third                            of esophagus.                           - Gastroesophageal flap valve classified as Hill                            Grade II (fold present, opens with respiration).                           - Normal stomach.                           - Normal duodenal bulb, first portion of the                            duodenum and second portion of the duodenum.                           - The BRAVO pH capsule was positioned 30 cm from                            the incisors, which was 5 cm proximal to the GE                            junction.                           - No specimens collected. Moderate Sedation:      Not Applicable - Patient had care per Anesthesia. Recommendation:           - Patient has a contact number available for                            emergencies. The signs and symptoms of potential                            delayed complications were discussed with the  patient. Return to normal activities tomorrow.                            Written discharge instructions were provided to the                            patient.                           - Resume previous diet today.                           - Hold Dexilant and other acid suppression                            medications for the duration of the study.                           - Continue other medications as prescribed.                           - Return to GI clinic at appointment to be                            scheduled to review study results. Procedure Code(s):        --- Professional ---                           [phone], Esophagogastroduodenoscopy, flexible,                            transoral; diagnostic, including collection of                            specimen(s) by brushing or washing, when  performed                            (separate procedure) Diagnosis Code(s):        --- Professional ---                           Q40.2, Other specified congenital malformations of                            stomach                           R12, Heartburn CPT copyright 2019 American Medical Association. All rights reserved. The codes documented in this report are preliminary and upon coder review may  be revised to meet current compliance requirements. Gerrit Heck, MD 03/26/2019 3:27:19 PM Number of Addenda: 0

## 2019-03-26 NOTE — Anesthesia Preprocedure Evaluation (Addendum)
Anesthesia Evaluation  Patient identified by MRN, date of birth, ID band Patient awake    Reviewed: Allergy & Precautions, NPO status , Patient's Chart, lab work & pertinent test results  Airway Mallampati: II  TM Distance: >3 FB Neck ROM: Full    Dental  (+) Teeth Intact, Dental Advisory Given   Pulmonary    breath sounds clear to auscultation       Cardiovascular  Rhythm:Regular Rate:Normal     Neuro/Psych    GI/Hepatic   Endo/Other    Renal/GU      Musculoskeletal   Abdominal   Peds  Hematology   Anesthesia Other Findings   Reproductive/Obstetrics                             Anesthesia Physical Anesthesia Plan  ASA: II  Anesthesia Plan: MAC   Post-op Pain Management:    Induction:   PONV Risk Score and Plan: Ondansetron and Propofol infusion  Airway Management Planned: Natural Airway and Nasal Cannula  Additional Equipment:   Intra-op Plan:   Post-operative Plan:   Informed Consent: I have reviewed the patients History and Physical, chart, labs and discussed the procedure including the risks, benefits and alternatives for the proposed anesthesia with the patient or authorized representative who has indicated his/her understanding and acceptance.     Dental advisory given  Plan Discussed with: CRNA and Anesthesiologist  Anesthesia Plan Comments:         Anesthesia Quick Evaluation

## 2019-03-26 NOTE — Anesthesia Postprocedure Evaluation (Signed)
Anesthesia Post Note  Patient: Tara Howard  Procedure(s) Performed: ESOPHAGOGASTRODUODENOSCOPY (EGD) WITH PROPOFOL (N/A ) BRAVO PH STUDY (N/A )     Patient location during evaluation: Endoscopy Anesthesia Type: MAC Level of consciousness: awake and alert Pain management: pain level controlled Vital Signs Assessment: post-procedure vital signs reviewed and stable Respiratory status: spontaneous breathing, nonlabored ventilation, respiratory function stable and patient connected to nasal cannula oxygen Cardiovascular status: blood pressure returned to baseline and stable Postop Assessment: no apparent nausea or vomiting Anesthetic complications: no    Last Vitals:  Vitals:   03/26/19 1610 03/26/19 1619  BP: 135/71   Pulse: (!) 58 65  Resp: 19 16  Temp:    SpO2: 100% 100%    Last Pain:  Vitals:   03/26/19 1619  TempSrc:   PainSc: 0-No pain                 Mayank Teuscher L Shulamit Donofrio

## 2019-03-27 ENCOUNTER — Telehealth: Payer: Self-pay | Admitting: Gastroenterology

## 2019-03-27 DIAGNOSIS — K219 Gastro-esophageal reflux disease without esophagitis: Secondary | ICD-10-CM

## 2019-03-27 MED ORDER — LIDOCAINE VISCOUS HCL 2 % MT SOLN: 15.0000 mL | Freq: Four times a day (QID) | OROMUCOSAL | 1 refills | Status: DC | PRN

## 2019-03-27 NOTE — Telephone Encounter (Signed)
Verbal order received for 2% viscous lidocaine 39ml every 6hr prn esophageal pain (1 bottle (190ml) with 1 refill) RX sent to pharmacy of patient choice-patient has been made aware of RX and MD recommendations; patient is agreeable to plan of care and was advised to call back to the office should questions/concerns arise; patient verbalized understanding of information/instructions;

## 2019-03-27 NOTE — Telephone Encounter (Signed)
Called to follow-up regarding yesterday's procedure.  Was scheduled for an EGD with Bravo placement.  Procedure was completed yesterday, but only in recovery was it noted that the Bravo capsule that was placed was one that required calibration.  This was not the device that was ordered, as we were set up for a calibration-free model.  Discussed this extensively with the [facility] staff yesterday/last evening, along with the device rep.  Device rep acknowledged the error of sending the wrong devices, which were nearly indistinguishable from the ones that we had ordered.  Safety portal entered.  I discussed this extensively with the patient today.  I apologized profusely on behalf of the [facility] unit and [facility] as a whole.  While she was understanding of the mixup and confusion, I still find this to be unacceptable.  With that said, she would still like to proceed with a repeat study in October.  I made every assurance to let her know that we can schedule to get this done and ensure that the appropriate equipment is ordered ahead of time and available.  She does endorse some odynophagia.  Plan to treat with viscous lidocaine 4% prn q6 hours.  Discussed with [facility].  Plan for EGD with Bravo placement on 05/24/2019 as first case with me.    Request was already made by me to wave any bill for yesterday's procedure and to similarly wave any bills for her repeat procedure.  I find this to be very reasonable request.  The patient herself made a request for reimbursement for her lost time from work yesterday.  I discussed this with Deb from [facility], who will forward this request on.  I find that too to be a very reasonable request on her behalf.  All questions answered, and again sincerely apologetic on behalf of the organization.  She appreciated the phone call and candor.

## 2019-03-28 ENCOUNTER — Telehealth: Payer: Self-pay | Admitting: Gastroenterology

## 2019-03-28 ENCOUNTER — Encounter (HOSPITAL_COMMUNITY): Payer: Self-pay | Admitting: Gastroenterology

## 2019-03-28 DIAGNOSIS — K219 Gastro-esophageal reflux disease without esophagitis: Secondary | ICD-10-CM

## 2019-03-29 ENCOUNTER — Ambulatory Visit: Payer: BC Managed Care – PPO | Admitting: Family Medicine

## 2019-03-29 ENCOUNTER — Encounter: Payer: Self-pay | Admitting: *Deleted

## 2019-03-29 MED ORDER — SUCRALFATE 1 GM/10ML PO SUSP: 1.0000 g | Freq: Four times a day (QID) | ORAL | 1 refills | Status: DC

## 2019-03-29 NOTE — Telephone Encounter (Signed)
Odynophagia can occur in approximately 4% of patients after Bravo placement.  Agree that the Bravo capsule has likely fallen off at this time, but still could have some erosion/irritation when it was initially placed.  If the viscous lidocaine is not improving the symptoms, can also give Carafate to drink 10 mL as needed every 4-6 hours for esophageal pain.  1 bottle, RF 1.  As for recurrence of odynophagia with the next Bravo placement, again the risks would still be about 4% for esophageal pain and an additional 1-2% for chest pain.  Not many endoscopic maneuvers to try to limit this or predict who may or may not develop the symptoms, but would hope given the low percentages that it would not recur.

## 2019-03-29 NOTE — Telephone Encounter (Signed)
Called and spoke with patient-patient reports she is still having difficulties with swallowing since having the BRAVO placed; patient is chewing food well but feels like the "food keeps getting stuck right where the BRAVO was placed or has already fallen off-it is just really sore-things feel like they are getting stuck no matter how well the food is chewed up or whatever I am swallowing-I am using the Lidocaine but it really is not helping";   Side note: the patient has agreed to go ahead with the EGD with BRAVO placement (again) but wants to make sure she is not going to have the swallowing issue until then or for a long period of time after the BRAVO placement-patient is scheduled to have EGD with BRAVO on 05/24/2019 at 7:30 at [facility] endo  Please advise on alternative measure for swallowing difficulties

## 2019-03-29 NOTE — Progress Notes (Deleted)
Established Patient Office Visit  Subjective:  Patient ID: Tara Howard, female    DOB: 06/11/1971  Age: 48 y.o. MRN: [id number]  CC: No chief complaint on file.   HPI [name] presents for   Follow-up asthma-  Past Medical History:  Diagnosis Date  . 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 Anxiety   . Asthma   . Breast cancer ([place])   . Chronic headache   . Depression   . Fibromyalgia   . Fibromyalgia 2006   pain / headaches  . GERD (gastroesophageal reflux disease)   . IBS (irritable bowel syndrome)     Past Surgical History:  Procedure Laterality Date  . bilateral mastectomy  11/01/2018  . BRAVO [place] STUDY N/A 03/26/2019   Procedure: BRAVO [place];  Surgeon: Lavena Bullion, DO;  Location: [facility];  Service: Gastroenterology;  Laterality: N/A;  . BREAST LUMPECTOMY Left 09/2018  . BREAST RECONSTRUCTION  12/2018  . CESAREAN SECTION     X 2  . ENDOMETRIAL ABLATION    . ESOPHAGOGASTRODUODENOSCOPY     said it was done maybe about 10 years ago. In [place]  . ESOPHAGOGASTRODUODENOSCOPY (EGD) WITH PROPOFOL N/A 03/26/2019   Procedure: ESOPHAGOGASTRODUODENOSCOPY (EGD) WITH PROPOFOL;  Surgeon: Lavena Bullion, DO;  Location: [facility];  Service: Gastroenterology;  Laterality: N/A;  . TONSILLECTOMY    . TONSILLECTOMY    . TUBAL LIGATION    . UPPER GASTROINTESTINAL ENDOSCOPY      Family History  Problem Relation Age of Onset  . CAD Father        3 vessel bypass  . Depression Mother   . Hypertension Mother   . Heart attack Mother   . CAD Mother        3 vessel bypass  . Irritable bowel syndrome Mother   . Aneurysm Mother        brain  . Cancer Paternal Grandmother        of the bowel per patient   . Esophageal cancer Paternal Grandmother     Social History   Socioeconomic History  . Marital status: Married    Spouse name: Not on file  . Number of children: 2  . Years of education: Not on file  . Highest education level: Not on file   Occupational History  . Occupation: Wellsite geologist: [employer]    Comment: [place]  Social Needs  . Financial resource strain: Not on file  . Food insecurity    Worry: Not on file    Inability: Not on file  . Transportation needs    Medical: Not on file    Non-medical: Not on file  Tobacco Use  . Smoking status: Never Smoker  . Smokeless tobacco: Never Used  Substance and Sexual Activity  . Alcohol use: No  . Drug use: Never  . Sexual activity: Yes  Lifestyle  . Physical activity    Days per week: Not on file    Minutes per session: Not on file  . Stress: Not on file  Relationships  . Social Herbalist on phone: Not on file    Gets together: Not on file    Attends religious service: Not on file    Active member of club or organization: Not on file    Attends meetings of clubs or organizations: Not on file    Relationship status: Not on file  . Intimate partner violence  Fear of current or ex partner: Not on file    Emotionally abused: Not on file    Physically abused: Not on file    Forced sexual activity: Not on file  Other Topics Concern  . Not on file  Social History Narrative  . Not on file    Outpatient Medications Prior to Visit  Medication Sig Dispense Refill  . B Complex-C (B-COMPLEX WITH VITAMIN C) tablet Take 1 tablet by mouth daily.    . celecoxib (CELEBREX) 100 MG capsule One to 2 tablets by mouth daily as needed for pain. (Patient taking differently: Take 100-200 mg by mouth daily as needed (pain.). ) 60 capsule 3  . cetirizine (ZYRTEC) 10 MG tablet Take 10 mg by mouth daily.    Marland Kitchen dexlansoprazole (DEXILANT) 60 MG capsule Take 1 capsule (60 mg total) by mouth daily. (Patient not taking: Reported on 03/25/2019) 30 capsule 2  . fluticasone (FLONASE) 50 MCG/ACT nasal spray Place 1 spray into both nostrils daily.    Marland Kitchen levalbuterol (XOPENEX HFA) 45 MCG/ACT inhaler Inhale 2 puffs into the lungs every 6 (six)  hours as needed for wheezing or shortness of breath. Pt will call when needs to filled. 15 g 1  . lidocaine (XYLOCAINE) 2 % solution Use as directed 15 mLs in the mouth or throat every 6 (six) hours as needed for mouth pain (for esophageal pain). 100 mL 1  . Multiple Vitamin (MULTIVITAMIN WITH MINERALS) TABS tablet Take 1 tablet by mouth daily.    . polyethylene glycol (MIRALAX / GLYCOLAX) 17 g packet Take 17 g by mouth 3 (three) times daily.     . pregabalin (LYRICA) 150 MG capsule Take 1 capsule (150 mg total) by mouth 2 (two) times daily. (Patient not taking: Reported on 03/20/2019) 60 capsule 0  . pregabalin (LYRICA) 200 MG capsule Take 200 mg by mouth daily.    . Prucalopride Succinate (MOTEGRITY) 2 MG TABS Take 2 mg by mouth daily. (Patient not taking: Reported on 03/25/2019) 90 tablet 3  . QVAR REDIHALER 80 MCG/ACT inhaler Inhale 2 puffs into the lungs 2 (two) times daily.     . tamoxifen (NOLVADEX) 20 MG tablet Take 20 mg by mouth at bedtime.    . traZODone (DESYREL) 100 MG tablet TAKE 1 & 1/2 TABLETS BY MOUTH AT BEDTIME (Patient taking differently: Take 150 mg by mouth at bedtime. ) 135 tablet 3  . TRULANCE 3 MG TABS Take 1 tablet by mouth daily. (Patient not taking: Reported on 03/20/2019) 30 tablet 5  . venlafaxine (EFFEXOR) 75 MG tablet Take 1 tablet (75 mg total) by mouth daily. 90 tablet 0   No facility-administered medications prior to visit.     Allergies  Allergen Reactions  . Azithromycin Other (See Comments) and Nausea And Vomiting    Nausea and diarrhea   . Augmentin [Amoxicillin-Pot Clavulanate] Diarrhea, Nausea And Vomiting and Other (See Comments)    Stomach cramps Did it involve swelling of the face/tongue/throat, SOB, or low BP? No Did it involve sudden or severe rash/hives, skin peeling, or any reaction on the inside of your mouth or nose? No Did you need to seek medical attention at a hospital or doctor's office? No When did it last happen?More than 3 years  ago If all above answers are "NO", may proceed with cephalosporin use.   Yvette Rack [Cyclobenzaprine] Other (See Comments)    constipation  . Linzess [Linaclotide] Other (See Comments)  . Zolpidem Tartrate Other (See Comments)  Hallucinations  . Clonidine Derivatives Rash  . Oxybutynin Rash    ROS Review of Systems    Objective:    Physical Exam  There were no vitals taken for this visit. Wt Readings from Last 3 Encounters:  03/15/19 154 lb 2 oz (69.9 kg)  02/28/19 151 lb (68.5 kg)  02/05/19 149 lb (67.6 kg)     Health Maintenance Due  Topic Date Due  . INFLUENZA VACCINE  02/23/2019    There are no preventive care reminders to display for this patient.  Lab Results  Component Value Date   TSH 1.235 10/09/2014   Lab Results  Component Value Date   WBC 5.3 10/09/2014   HGB 13.8 10/09/2014   HCT 41.7 10/09/2014   MCV 94.1 10/09/2014   PLT 249 10/09/2014   Lab Results  Component Value Date   NA 140 08/08/2016   NA 140 08/08/2016   K 4.3 08/08/2016   K 4.3 08/08/2016   CO2 27 10/09/2014   GLUCOSE 84 10/09/2014   BUN 12 08/08/2016   BUN 18 08/08/2016   CREATININE 0.7 08/08/2016   CREATININE 0.7 08/08/2016   BILITOT 0.5 10/09/2014   ALKPHOS 51 08/08/2016   AST 15 08/08/2016   AST 15 08/08/2016   ALT 13 08/08/2016   ALT 13 08/08/2016   PROT 6.4 10/09/2014   ALBUMIN 4.3 10/09/2014   CALCIUM 9.2 10/09/2014   Lab Results  Component Value Date   CHOL 146 08/08/2016   CHOL 146 08/08/2016   Lab Results  Component Value Date   HDL 48 08/08/2016   HDL 48 08/08/2016   Lab Results  Component Value Date   LDLCALC 85 08/08/2016   LDLCALC 85 08/08/2016   Lab Results  Component Value Date   TRIG 65 08/08/2016   TRIG 65 08/08/2016   No results found for: CHOLHDL No results found for: HGBA1C    Assessment & Plan:   Problem List Items Addressed This Visit      Respiratory   Extrinsic asthma - Primary      No orders of the defined types  were placed in this encounter.   Follow-up: No follow-ups on file.    Beatrice Lecher, MD

## 2019-03-29 NOTE — Telephone Encounter (Signed)
Patient has been rescheduled for her EGD with BRAVO placement on 05/24/2019 at 7:30 am at [facility]; patient has been sent prep instructions and has been set up for [place] screening on 05/21/2019 at 2:30pm; patient is aware of appt date/times;  RX has been sent in to patient's pharmacy of choice-Called and spoke with patient-patient informed of MD recommendations; patient is agreeable with plan of care; RX has been sent to pharmacy of choice; Patient verbalized understanding of information/instructions;  Patient was advised to call the office at [phone] if questions/concerns arise;

## 2019-03-31 ENCOUNTER — Other Ambulatory Visit: Payer: Self-pay | Admitting: Family Medicine

## 2019-04-12 ENCOUNTER — Encounter: Payer: Self-pay | Admitting: Family Medicine

## 2019-04-29 ENCOUNTER — Other Ambulatory Visit: Payer: Self-pay | Admitting: Family Medicine

## 2019-05-21 ENCOUNTER — Inpatient Hospital Stay (HOSPITAL_COMMUNITY): Admission: RE | Admit: 2019-05-21 | Payer: BC Managed Care – PPO | Source: Ambulatory Visit

## 2019-05-21 ENCOUNTER — Telehealth: Payer: Self-pay | Admitting: Gastroenterology

## 2019-05-21 NOTE — Telephone Encounter (Signed)
Agree with that plan.  Thank you for the update on Tara Howard.

## 2019-05-21 NOTE — Telephone Encounter (Signed)
Pt is calling in because she is supposed to have a COVID test today for her appt on 10/30 at the hospital and said that she has been vomitting since this moring- very dizzy and extremly sick. She will not be able to make COVID test today and she asked what that meant for the hospital procedure. Told her that it would probably be r/s due to her being extremely sick but let her know that Dr. Vivia Ewing nurse would reach out to her regarding hospital.

## 2019-05-21 NOTE — Telephone Encounter (Signed)
Called and spoke with patient-patient reports she started feeling bad this morning-has been vomiting, feeling dizzy, and just "down right sick"-wanted to know if she can complete her COVID screening on 10/28 and still have the procedure on 10/30- Patient advised that if symptoms were still present on Thursday she needs to call the office and cancel her procedure-patient also advised she could complete her COVID screening on 10/28 (per [facility]) as  results should be back in time for the procedure  Patient verbalized understanding of information/instructions and reports she will call back to the office by 4:00pm on 10/28 to notify the office of completion of COVID screening or to cancel procedure on 10/30  Please advise if the patient's procedure needs to be rescheduled now or wait until the patient calls into the office

## 2019-05-22 ENCOUNTER — Telehealth: Payer: Self-pay | Admitting: Gastroenterology

## 2019-05-22 ENCOUNTER — Encounter: Payer: Self-pay | Admitting: Family Medicine

## 2019-05-22 ENCOUNTER — Ambulatory Visit (INDEPENDENT_AMBULATORY_CARE_PROVIDER_SITE_OTHER): Payer: BC Managed Care – PPO | Admitting: Family Medicine

## 2019-05-22 VITALS — Temp 98.1°F | Ht 64.0 in | Wt 154.0 lb

## 2019-05-22 DIAGNOSIS — R42 Dizziness and giddiness: Secondary | ICD-10-CM

## 2019-05-22 MED ORDER — PREDNISONE 5 MG (48) PO TBPK: ORAL_TABLET | ORAL | 0 refills | Status: DC

## 2019-05-22 MED ORDER — DIAZEPAM 5 MG PO TABS: 5.0000 mg | ORAL_TABLET | Freq: Three times a day (TID) | ORAL | 0 refills | Status: DC | PRN

## 2019-05-22 MED ORDER — VALACYCLOVIR HCL 1 G PO TABS: 1000.0000 mg | ORAL_TABLET | Freq: Two times a day (BID) | ORAL | 2 refills | Status: DC

## 2019-05-22 MED ORDER — MECLIZINE HCL 25 MG PO TABS: 25.0000 mg | ORAL_TABLET | Freq: Three times a day (TID) | ORAL | 3 refills | Status: DC | PRN

## 2019-05-22 NOTE — Patient Instructions (Signed)
Thank you for coming in today.   Benign Positional Vertigo Vertigo is the feeling that you or your surroundings are moving when they are not. Benign positional vertigo is the most common form of vertigo. This is usually a harmless condition (benign). This condition is positional. This means that symptoms are triggered by certain movements and positions. This condition can be dangerous if it occurs while you are doing something that could cause harm to you or others. This includes activities such as driving or operating machinery. What are the causes? In many cases, the cause of this condition is not known. It may be caused by a disturbance in an area of the inner ear that helps your brain to sense movement and balance. This disturbance can be caused by:  Viral infection (labyrinthitis).  Head injury.  Repetitive motion, such as jumping, dancing, or running. What increases the risk? You are more likely to develop this condition if:  You are a woman.  You are 93 years of age or older. What are the signs or symptoms? Symptoms of this condition usually happen when you move your head or your eyes in different directions. Symptoms may start suddenly, and usually last for less than a minute. They include:  Loss of balance and falling.  Feeling like you are spinning or moving.  Feeling like your surroundings are spinning or moving.  Nausea and vomiting.  Blurred vision.  Dizziness.  Involuntary eye movement (nystagmus). Symptoms can be mild and cause only minor problems, or they can be severe and interfere with daily life. Episodes of benign positional vertigo may return (recur) over time. Symptoms may improve over time. How is this diagnosed? This condition may be diagnosed based on:  Your medical history.  Physical exam of the head, neck, and ears.  Tests, such as: ? MRI. ? CT scan. ? Eye movement tests. Your health care provider may ask you to change positions quickly while  he or she watches you for symptoms of benign positional vertigo, such as nystagmus. Eye movement may be tested with a variety of exams that are designed to evaluate or stimulate vertigo. ? An electroencephalogram (EEG). This records electrical activity in your brain. ? Hearing tests. You may be referred to a health care provider who specializes in ear, nose, and throat (ENT) problems (otolaryngologist) or a provider who specializes in disorders of the nervous system (neurologist). How is this treated?  This condition may be treated in a session in which your health care provider moves your head in specific positions to adjust your inner ear back to normal. Treatment for this condition may take several sessions. Surgery may be needed in severe cases, but this is rare. In some cases, benign positional vertigo may resolve on its own in 2-4 weeks. Follow these instructions at home: Safety  Move slowly. Avoid sudden body or head movements or certain positions, as told by your health care provider.  Avoid driving until your health care provider says it is safe for you to do so.  Avoid operating heavy machinery until your health care provider says it is safe for you to do so.  Avoid doing any tasks that would be dangerous to you or others if vertigo occurs.  If you have trouble walking or keeping your balance, try using a cane for stability. If you feel dizzy or unstable, sit down right away.  Return to your normal activities as told by your health care provider. Ask your health care provider what activities are  safe for you. General instructions  Take over-the-counter and prescription medicines only as told by your health care provider.  Drink enough fluid to keep your urine pale yellow.  Keep all follow-up visits as told by your health care provider. This is important. Contact a health care provider if:  You have a fever.  Your condition gets worse or you develop new symptoms.  Your  family or friends notice any behavioral changes.  You have nausea or vomiting that gets worse.  You have numbness or a "pins and needles" sensation. Get help right away if you:  Have difficulty speaking or moving.  Are always dizzy.  Faint.  Develop severe headaches.  Have weakness in your legs or arms.  Have changes in your hearing or vision.  Develop a stiff neck.  Develop sensitivity to light. Summary  Vertigo is the feeling that you or your surroundings are moving when they are not. Benign positional vertigo is the most common form of vertigo.  The cause of this condition is not known. It may be caused by a disturbance in an area of the inner ear that helps your brain to sense movement and balance.  Symptoms include loss of balance and falling, feeling that you or your surroundings are moving, nausea and vomiting, and blurred vision.  This condition can be diagnosed based on symptoms, physical exam, and other tests, such as MRI, CT scan, eye movement tests, and hearing tests.  Follow safety instructions as told by your health care provider. You will also be told when to contact your health care provider in case of problems. This information is not intended to replace advice given to you by your health care provider. Make sure you discuss any questions you have with your health care provider. Document Released: 04/18/2006 Document Revised: 12/20/2017 Document Reviewed: 12/20/2017 Elsevier Patient Education  [employer].   Labyrinthitis  Labyrinthitis is an inner ear infection. The inner ear is a system of tubes and canals (labyrinth) that are filled with fluid. The inner ear also contains nerve cells that send hearing and balance signals to the brain. When tiny germs (microorganisms) get inside the labyrinth, they harm the cells that send messages to the brain. This can cause changes in hearing and balance. Labyrinthitis usually develops suddenly and goes away with  treatment in a few weeks (acute labyrinthitis). If the infection damages parts of the labyrinth, some symptoms may last for a long time (chronic labyrinthitis). What are the causes? Labyrinthitis is most often caused by a virus, such as one that causes:  Infectious mononucleosis, also called "mono."  Measles.  The flu.  Herpes. Labyrinthitis can also be caused by bacteria that spread from an infection in the brain or the middle ear (suppurative labyrinthitis). In some cases, the bacteria may produce a poison (toxin) that gets inside the labyrinth (serous labyrinthitis). What increases the risk? You may be at greater risk for labyrinthitis if you:  Recently had a mouth, nose, or throat infection (upper respiratory infection) or an ear infection.  Drink a lot of alcohol.  Smoke.  Use certain drugs.  Are not well-rested (are fatigued).  Are experiencing a lot of stress.  Have allergies. What are the signs or symptoms? Symptoms of labyrinthitis usually start suddenly. The symptoms may range from mild to severe, and may include:  Dizziness.  Hearing loss.  A feeling that you or your surroundings are moving when they are not (vertigo).  Ringing in your ear (tinnitus).  Nausea and  vomiting.  Trouble focusing your eyes. Symptoms of chronic labyrinthitis may include:  Fatigue.  Confusion.  Hearing loss.  Tinnitus.  Poor balance.  Vertigo after sudden head movements. How is this diagnosed? This condition may be diagnosed based on:  Your symptoms and medical history. Your health care provider may ask about any dizziness or hearing loss you have and any recent upper respiratory infections.  A physical exam that involves: ? Checking your ears for infection. ? Testing your balance. ? Checking your eye movement.  Hearing tests.  Imaging tests, such as CT scan or MRI.  Tests of your eye movements (electronystagmogram, ENG). How is this treated?  Treatment  depends on the cause. If your condition is caused by bacteria, you may need antibiotic medicine. If it is caused by a virus, it may get better on its own. Regardless of the cause, you may be treated with:  Medicines to: ? Stop dizziness. ? Relieve nausea. ? Reduce inflammation. ? Speed up your recovery.  Rest. You may be asked to limit your activities until dizziness goes away.  IV fluids. These may be given at a hospital. You may need IV fluids if you have severe nausea and vomiting.  Physical therapy. A therapist can teach you exercises to help you adjust to feeling dizzy (vestibular rehabilitation exercises). You may need this if you have dizziness that does not go away. Follow these instructions at home: Medicines  Take over-the-counter and prescription medicines only as told by your health care provider.  If you were prescribed an antibiotic medicine, take it exactly as told by your health care provider. Do not stop taking the antibiotic even if you start to feel better. Activity  Rest as much as possible.  Limit your activity as directed. Ask your health care provider what activities are safe for you.  Do not make sudden movements until any dizziness goes away.  If physical therapy was prescribed, do exercises as directed. General instructions  Avoid loud noises and bright lights.  Do not drive until your health care provider says that this is safe for you.  Drink enough fluid to keep your urine pale yellow.  Keep all follow-up visits as told by your health care provider. This is important. Contact a health care provider if you have:  Symptoms that do not get better with medicine.  Symptoms that last longer than two weeks.  A fever. Get help right away if you have:  Nausea or vomiting that is severe or does not go away.  Severe dizziness.  Sudden hearing loss. Summary  Labyrinthitis is an infection of the inner ear. It can cause changes in hearing and  balance.  Symptoms usually start suddenly and include dizziness, hearing loss, and nausea and vomiting. You may also have ringing in your ear (tinnitus), trouble focusing your eyes, and a feeling that you or your surroundings are moving when they are not (vertigo).  If the condition lasts more than a few weeks, symptoms may include fatigue, confusion, hearing loss, poor balance, tinnitus, and vertigo.  Treatment depends on the cause. If your labyrinthitis is caused by bacteria, you may need antibiotic medicine. If your labyrinthitis is caused by a virus, it may get better on its own.  Follow your health care provider's instructions, including how to take medicines, what activities to avoid, and when to get medical help. This information is not intended to replace advice given to you by your health care provider. Make sure you discuss any questions you  have with your health care provider. Document Released: 08/01/2014 Document Revised: 04/30/2018 Document Reviewed: 07/22/2017 Elsevier Patient Education  2020 [employer].

## 2019-05-22 NOTE — Telephone Encounter (Signed)
Pt wants to r/s her procedure at [facility] scheduled for this Friday. Pls call her.

## 2019-05-22 NOTE — Progress Notes (Signed)
Virtual Visit  via Video Note  I connected with      Tara Howard by a video enabled telemedicine application and verified that I am speaking with the correct person using two identifiers.   I discussed the limitations of evaluation and management by telemedicine and the availability of in person appointments. The patient expressed understanding and agreed to proceed.  History of Present Illness: Tara Howard is a 48 y.o. female who would like to discuss vertigo.  Smt developed vertigo yesterday.  She feels an intense room spinning sensation that occurs when she turns her head or moves.  She notes some mild ringing in her left ear.  She denies any ear pain or pressure or difficulty hearing.  The vertigo lasts for a few seconds and resolves when she stops moving.  She notes that she has had some nausea and vomiting.  She tried Dramamine and Phenergan which do help some.  She had a similar episode but less severe in the past that resolved spontaneously.  She feels well otherwise.  No fevers chills nausea vomiting or diarrhea.  She denies any weakness or numbness difficulty speaking confusion or loss of function.     Observations/Objective: Temp 98.1 F (36.7 C) (Temporal)   Ht 5\' 4"  (1.626 m)   Wt 154 lb (69.9 kg)   BMI 26.43 kg/m  Wt Readings from Last 5 Encounters:  05/22/19 154 lb (69.9 kg)  03/15/19 154 lb 2 oz (69.9 kg)  02/28/19 151 lb (68.5 kg)  02/05/19 149 lb (67.6 kg)  01/31/19 149 lb (67.6 kg)   Exam: Appearance in bed but nontoxic-appearing Normal Speech.  Alert and oriented normal speech thought process and affect. No nystagmus visible on exam. Normal facial motion.  Face appears to be symmetrical.   Assessment and Plan: 48 y.o. female with  Vertigo.  Patient describes BPPV type vertigo.  Labyrinthitis is also a possibility especially given her mild tinnitus in her left ear.  She is quite symptomatic with this vertigo however.  Plan for meclizine  and Valium for vertigo control.  Will refer to vestibular therapy for presumed BPPV.  However it is possible she also has labyrinthitis.  We will go ahead and prescribe now prednisone and Valtrex.  Recheck if not improving.  Work note provided.  Precautions reviewed.  PDMP not reviewed this encounter. No orders of the defined types were placed in this encounter.  No orders of the defined types were placed in this encounter.   Follow Up Instructions:    I discussed the assessment and treatment plan with the patient. The patient was provided an opportunity to ask questions and all were answered. The patient agreed with the plan and demonstrated an understanding of the instructions.   The patient was advised to call back or seek an in-person evaluation if the symptoms worsen or if the condition fails to improve as anticipated.  Time: 25 minutes of intraservice time, with >39 minutes of total time during today's visit.      Historical information moved to improve visibility of documentation.  Past Medical History:  Diagnosis Date  . 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 Anxiety   . Asthma   . Breast cancer ([place])   . Chronic headache   . Depression   . Fibromyalgia   . Fibromyalgia 2006   pain / headaches  . GERD (gastroesophageal reflux disease)   . IBS (irritable bowel syndrome)    Past Surgical History:  Procedure  Laterality Date  . bilateral mastectomy  11/01/2018  . BRAVO [place] STUDY N/A 03/26/2019   Procedure: BRAVO [place];  Surgeon: Lavena Bullion, DO;  Location: [facility];  Service: Gastroenterology;  Laterality: N/A;  . BREAST LUMPECTOMY Left 09/2018  . BREAST RECONSTRUCTION  12/2018  . CESAREAN SECTION     X 2  . ENDOMETRIAL ABLATION    . ESOPHAGOGASTRODUODENOSCOPY     said it was done maybe about 10 years ago. In [place]  . ESOPHAGOGASTRODUODENOSCOPY (EGD) WITH PROPOFOL N/A 03/26/2019   Procedure: ESOPHAGOGASTRODUODENOSCOPY (EGD) WITH PROPOFOL;  Surgeon:  Lavena Bullion, DO;  Location: [facility];  Service: Gastroenterology;  Laterality: N/A;  . TONSILLECTOMY    . TONSILLECTOMY    . TUBAL LIGATION    . UPPER GASTROINTESTINAL ENDOSCOPY     Social History   Tobacco Use  . Smoking status: Never Smoker  . Smokeless tobacco: Never Used  Substance Use Topics  . Alcohol use: No   family history includes Aneurysm in her mother; CAD in her father and mother; Cancer in her paternal grandmother; Depression in her mother; Esophageal cancer in her paternal grandmother; Heart attack in her mother; Hypertension in her mother; Irritable bowel syndrome in her mother.  Medications: Current Outpatient Medications  Medication Sig Dispense Refill  . acetaminophen (TYLENOL) 500 MG tablet Take 1,500 mg by mouth 2 (two) times daily as needed for moderate pain or headache.    . B Complex-C (B-COMPLEX WITH VITAMIN C) tablet Take 1 tablet by mouth daily.    . Biotin w/ Vitamins C & E (HAIR SKIN & NAILS GUMMIES PO) Take 1 each by mouth daily.    . celecoxib (CELEBREX) 100 MG capsule One to 2 tablets by mouth daily as needed for pain. (Patient taking differently: Take 100 mg by mouth 2 (two) times daily as needed (pain.). ) 60 capsule 3  . cetirizine (ZYRTEC) 10 MG tablet Take 10 mg by mouth daily.    . Cholecalciferol (DIALYVITE VITAMIN D 5000) 125 MCG (5000 UT) capsule Take 5,000 Units by mouth daily.    Marland Kitchen DEXILANT 60 MG capsule Take 1 capsule (60 mg total) by mouth daily. 30 capsule 5  . fluticasone (FLONASE) 50 MCG/ACT nasal spray Place 1 spray into both nostrils daily.    Marland Kitchen Hyaluronic Acid-Vitamin C (HYALURONIC ACID PO) Take 1 tablet by mouth daily.    Marland Kitchen levalbuterol (XOPENEX HFA) 45 MCG/ACT inhaler Inhale 2 puffs into the lungs every 6 (six) hours as needed for wheezing or shortness of breath. Pt will call when needs to filled. 15 g 1  . Polyethyl Glycol-Propyl Glycol (SYSTANE OP) Place 1 drop into both eyes daily.    . polyethylene glycol (MIRALAX /  GLYCOLAX) 17 g packet Take 17 g by mouth 2 (two) times daily.     . pregabalin (LYRICA) 150 MG capsule Take 1 capsule (150 mg total) by mouth 2 (two) times daily. 60 capsule 0  . pregabalin (LYRICA) 200 MG capsule Take 200 mg by mouth daily.    . Prucalopride Succinate (MOTEGRITY) 2 MG TABS Take 2 mg by mouth daily. 90 tablet 3  . QVAR REDIHALER 80 MCG/ACT inhaler Inhale 2 puffs into the lungs 2 (two) times daily.     . tamoxifen (NOLVADEX) 20 MG tablet Take 20 mg by mouth at bedtime.    . traZODone (DESYREL) 100 MG tablet TAKE 1 & 1/2 TABLETS BY MOUTH AT BEDTIME 135 tablet 3  . venlafaxine (EFFEXOR) 75 MG tablet  Take 1 tablet (75 mg total) by mouth daily. 90 tablet 0   No current facility-administered medications for this visit.    Allergies  Allergen Reactions  . Azithromycin Diarrhea, Nausea And Vomiting and Other (See Comments)       . Augmentin [Amoxicillin-Pot Clavulanate] Diarrhea, Nausea And Vomiting and Other (See Comments)    Stomach cramps Did it involve swelling of the face/tongue/throat, SOB, or low BP? No Did it involve sudden or severe rash/hives, skin peeling, or any reaction on the inside of your mouth or nose? No Did you need to seek medical attention at a hospital or doctor's office? No When did it last happen?More than 3 years ago If all above answers are "NO", may proceed with cephalosporin use.   . Clonidine Derivatives Rash  . Flexeril [Cyclobenzaprine] Other (See Comments)    constipation  . Linzess [Linaclotide] Other (See Comments)    cramping  . Oxybutynin Rash  . Zolpidem Tartrate Other (See Comments)    Hallucinations

## 2019-05-22 NOTE — Telephone Encounter (Signed)
Called and spoke with patient-patient reports her dizziness and nausea have not ceased and she is needing to cancel her procedure for 05/24/2019; procedure cancelled per patient request and patient has been placed on back log for procedure to be rescheduled at next earliest date available; Patient advised to call back to the office at [phone] should questions/concerns arise; Patient verbalized understanding of information/instructions;

## 2019-05-24 ENCOUNTER — Encounter (HOSPITAL_COMMUNITY): Admission: RE | Payer: Self-pay | Source: Home / Self Care

## 2019-05-24 ENCOUNTER — Ambulatory Visit (HOSPITAL_COMMUNITY)
Admission: RE | Admit: 2019-05-24 | Payer: BC Managed Care – PPO | Source: Home / Self Care | Admitting: Gastroenterology

## 2019-05-24 SURGERY — ESOPHAGOGASTRODUODENOSCOPY (EGD) WITH PROPOFOL
Anesthesia: Monitor Anesthesia Care

## 2019-05-24 NOTE — Telephone Encounter (Signed)
Called and spoke with patient-patient informed of procedure date for EGD with bravo placement of 06/27/2019 at 10:10 am; patient has been scheduled for COVID screening on 06/24/2019 at 3:20pm per patient request; patient prep instructions have been sent to patient via mail and MyChart; Patient advised to call back to the office at [phone] should questions/concerns arise;  Patient verbalized understanding of information/instructions;

## 2019-05-27 ENCOUNTER — Other Ambulatory Visit: Payer: Self-pay | Admitting: Family Medicine

## 2019-05-29 ENCOUNTER — Ambulatory Visit: Payer: BC Managed Care – PPO | Admitting: Rehabilitative and Restorative Service Providers"

## 2019-06-24 ENCOUNTER — Other Ambulatory Visit (HOSPITAL_COMMUNITY): Payer: BC Managed Care – PPO

## 2019-06-25 ENCOUNTER — Other Ambulatory Visit: Payer: Self-pay

## 2019-06-25 ENCOUNTER — Inpatient Hospital Stay (HOSPITAL_COMMUNITY): Admission: RE | Admit: 2019-06-25 | Payer: BC Managed Care – PPO | Source: Ambulatory Visit

## 2019-06-25 DIAGNOSIS — K219 Gastro-esophageal reflux disease without esophagitis: Secondary | ICD-10-CM

## 2019-06-25 DIAGNOSIS — Z20822 Contact with and (suspected) exposure to covid-19: Secondary | ICD-10-CM

## 2019-06-25 NOTE — Telephone Encounter (Signed)
Spoke to patient this morning who states she was not informed of the date and time for her Covid test prior to her procedure on 06/27/2019. Appointment has been made for Covid test on 06/25/2019 at 1:40 pm. All questions answered, patient voiced understanding.

## 2019-06-25 NOTE — Telephone Encounter (Signed)
Please chart on this patient-

## 2019-06-25 NOTE — Telephone Encounter (Signed)
Pt has questions regarding EGD with bravo placement scheduled 06/27/19.  She stated that she has not received information about COVID-19 testing and would like to confirm date and time.

## 2019-06-25 NOTE — Telephone Encounter (Signed)
Left message for patient to call back to the office;  

## 2019-06-26 ENCOUNTER — Other Ambulatory Visit: Payer: Self-pay

## 2019-06-26 ENCOUNTER — Encounter (HOSPITAL_COMMUNITY): Payer: Self-pay | Admitting: Emergency Medicine

## 2019-06-26 ENCOUNTER — Other Ambulatory Visit (HOSPITAL_COMMUNITY)
Admission: RE | Admit: 2019-06-26 | Discharge: 2019-06-26 | Disposition: A | Discharge status: Home / Self Care | Payer: BC Managed Care – PPO | Source: Ambulatory Visit | Attending: Gastroenterology | Admitting: Gastroenterology

## 2019-06-26 DIAGNOSIS — Z20828 Contact with and (suspected) exposure to other viral communicable diseases: Secondary | ICD-10-CM | POA: Insufficient documentation

## 2019-06-26 DIAGNOSIS — Z01812 Encounter for preprocedural laboratory examination: Secondary | ICD-10-CM | POA: Insufficient documentation

## 2019-06-26 LAB — SARS CORONAVIRUS 2 (TAT 6-24 HRS): SARS Coronavirus 2: NEGATIVE

## 2019-06-26 NOTE — Progress Notes (Signed)
Pre-op endo call completed  

## 2019-06-27 ENCOUNTER — Ambulatory Visit (HOSPITAL_COMMUNITY): Payer: BC Managed Care – PPO | Admitting: Certified Registered"

## 2019-06-27 ENCOUNTER — Encounter (HOSPITAL_COMMUNITY)
Admission: RE | Disposition: A | Discharge status: Home / Self Care | Payer: Self-pay | Source: Home / Self Care | Attending: Gastroenterology

## 2019-06-27 ENCOUNTER — Ambulatory Visit (HOSPITAL_COMMUNITY)
Admission: RE | Admit: 2019-06-27 | Discharge: 2019-06-27 | Disposition: A | Discharge status: Home / Self Care | Payer: BC Managed Care – PPO | Attending: Gastroenterology | Admitting: Gastroenterology

## 2019-06-27 ENCOUNTER — Encounter (HOSPITAL_COMMUNITY): Payer: Self-pay | Admitting: *Deleted

## 2019-06-27 ENCOUNTER — Other Ambulatory Visit: Payer: Self-pay

## 2019-06-27 DIAGNOSIS — K581 Irritable bowel syndrome with constipation: Secondary | ICD-10-CM | POA: Insufficient documentation

## 2019-06-27 DIAGNOSIS — J45909 Unspecified asthma, uncomplicated: Secondary | ICD-10-CM | POA: Insufficient documentation

## 2019-06-27 DIAGNOSIS — F419 Anxiety disorder, unspecified: Secondary | ICD-10-CM | POA: Insufficient documentation

## 2019-06-27 DIAGNOSIS — C50912 Malignant neoplasm of unspecified site of left female breast: Secondary | ICD-10-CM | POA: Insufficient documentation

## 2019-06-27 DIAGNOSIS — M797 Fibromyalgia: Secondary | ICD-10-CM | POA: Diagnosis not present

## 2019-06-27 DIAGNOSIS — Q402 Other specified congenital malformations of stomach: Secondary | ICD-10-CM | POA: Diagnosis not present

## 2019-06-27 DIAGNOSIS — F329 Major depressive disorder, single episode, unspecified: Secondary | ICD-10-CM | POA: Diagnosis not present

## 2019-06-27 DIAGNOSIS — Z853 Personal history of malignant neoplasm of breast: Secondary | ICD-10-CM | POA: Insufficient documentation

## 2019-06-27 DIAGNOSIS — Z791 Long term (current) use of non-steroidal anti-inflammatories (NSAID): Secondary | ICD-10-CM | POA: Insufficient documentation

## 2019-06-27 DIAGNOSIS — Z79899 Other long term (current) drug therapy: Secondary | ICD-10-CM | POA: Insufficient documentation

## 2019-06-27 DIAGNOSIS — K219 Gastro-esophageal reflux disease without esophagitis: Secondary | ICD-10-CM | POA: Diagnosis present

## 2019-06-27 DIAGNOSIS — K3189 Other diseases of stomach and duodenum: Secondary | ICD-10-CM | POA: Diagnosis not present

## 2019-06-27 HISTORY — PX: BRAVO PH STUDY: SHX5421

## 2019-06-27 HISTORY — PX: ESOPHAGOGASTRODUODENOSCOPY (EGD) WITH PROPOFOL: SHX5813

## 2019-06-27 LAB — NOVEL CORONAVIRUS, NAA: SARS-CoV-2, NAA: NOT DETECTED

## 2019-06-27 SURGERY — ESOPHAGOGASTRODUODENOSCOPY (EGD) WITH PROPOFOL
Anesthesia: Monitor Anesthesia Care

## 2019-06-27 MED ORDER — PROPOFOL 500 MG/50ML IV EMUL
INTRAVENOUS | Status: DC | PRN
  Administered 2019-06-27: 135 ug/kg/min via INTRAVENOUS

## 2019-06-27 MED ORDER — PROPOFOL 500 MG/50ML IV EMUL
INTRAVENOUS | Status: AC
  Filled 2019-06-27: qty 50

## 2019-06-27 MED ORDER — SODIUM CHLORIDE 0.9 % IV SOLN: INTRAVENOUS | Status: DC

## 2019-06-27 MED ORDER — LACTATED RINGERS IV SOLN
INTRAVENOUS | Status: DC | PRN
  Administered 2019-06-27: 10:00:00 via INTRAVENOUS

## 2019-06-27 MED ORDER — LACTATED RINGERS IV SOLN
INTRAVENOUS | Status: DC
  Administered 2019-06-27: 1000 mL via INTRAVENOUS

## 2019-06-27 MED ORDER — ONDANSETRON HCL 4 MG/2ML IJ SOLN
INTRAMUSCULAR | Status: DC | PRN
  Administered 2019-06-27: 4 mg via INTRAVENOUS

## 2019-06-27 MED ORDER — PROPOFOL 500 MG/50ML IV EMUL
INTRAVENOUS | Status: DC | PRN
  Administered 2019-06-27: 240 mg via INTRAVENOUS
  Administered 2019-06-27 (×2): 30 mg via INTRAVENOUS

## 2019-06-27 MED ORDER — LIDOCAINE HCL (CARDIAC) PF 100 MG/5ML IV SOSY
PREFILLED_SYRINGE | INTRAVENOUS | Status: DC | PRN
  Administered 2019-06-27 (×2): 50 mg via INTRATRACHEAL

## 2019-06-27 SURGICAL SUPPLY — 15 items

## 2019-06-27 NOTE — Discharge Instructions (Signed)
YOU HAD AN ENDOSCOPIC PROCEDURE TODAY: Refer to the procedure report and other information in the discharge instructions given to you for any specific questions about what was found during the examination. If this information does not answer your questions, please call [facility] office at [phone] to clarify.  ° °YOU SHOULD EXPECT: Some feelings of bloating in the abdomen. Passage of more gas than usual. Walking can help get rid of the air that was put into your GI tract during the procedure and reduce the bloating. If you had a lower endoscopy (such as a colonoscopy or flexible sigmoidoscopy) you may notice spotting of blood in your stool or on the toilet paper. Some abdominal soreness may be present for a day or two, also. ° °DIET: Your first meal following the procedure should be a light meal and then it is ok to progress to your normal diet. A half-sandwich or bowl of soup is an example of a good first meal. Heavy or fried foods are harder to digest and may make you feel nauseous or bloated. Drink plenty of fluids but you should avoid alcoholic beverages for 24 hours. If you had a esophageal dilation, please see attached instructions for diet.   ° °ACTIVITY: Your care partner should take you home directly after the procedure. You should plan to take it easy, moving slowly for the rest of the day. You can resume normal activity the day after the procedure however YOU SHOULD NOT DRIVE, use power tools, machinery or perform tasks that involve climbing or major physical exertion for 24 hours (because of the sedation medicines used during the test).  ° °SYMPTOMS TO REPORT IMMEDIATELY: °A gastroenterologist can be reached at any hour. Please call [phone]  for any of the following symptoms:  °Following lower endoscopy (colonoscopy, flexible sigmoidoscopy) °Excessive amounts of blood in the stool  °Significant tenderness, worsening of abdominal pains  °Swelling of the abdomen that is new, acute  °Fever of 100° or  higher  °Following upper endoscopy (EGD, EUS, ERCP, esophageal dilation) °Vomiting of blood or coffee ground material  °New, significant abdominal pain  °New, significant chest pain or pain under the shoulder blades  °Painful or persistently difficult swallowing  °New shortness of breath  °Black, tarry-looking or red, bloody stools ° °FOLLOW UP:  °If any biopsies were taken you will be contacted by phone or by letter within the next 1-3 weeks. Call [phone]  if you have not heard about the biopsies in 3 weeks.  °Please also call with any specific questions about appointments or follow up tests. ° °

## 2019-06-27 NOTE — Anesthesia Postprocedure Evaluation (Signed)
Anesthesia Post Note  Patient: Tara Howard  Procedure(s) Performed: ESOPHAGOGASTRODUODENOSCOPY (EGD) WITH PROPOFOL (N/A ) BRAVO [facility] STUDY (N/A )     Patient location during evaluation: PACU Anesthesia Type: MAC Level of consciousness: awake and alert Pain management: pain level controlled Vital Signs Assessment: post-procedure vital signs reviewed and stable Respiratory status: spontaneous breathing Cardiovascular status: stable Anesthetic complications: no    Last Vitals:  Vitals:   06/27/19 1040 06/27/19 1050  BP: 121/78 133/69  Pulse: 76 74  Resp: 11 15  Temp:    SpO2: 100% 100%    Last Pain:  Vitals:   06/27/19 1050  TempSrc:   PainSc: [place]

## 2019-06-27 NOTE — Transfer of Care (Signed)
Immediate Anesthesia Transfer of Care Note  Patient: Tara Howard  Procedure(s) Performed: ESOPHAGOGASTRODUODENOSCOPY (EGD) WITH PROPOFOL (N/A ) BRAVO PH STUDY (N/A )  Patient Location: Endoscopy Unit  Anesthesia Type:MAC  Level of Consciousness: oriented, drowsy, patient cooperative and responds to stimulation  Airway & Oxygen Therapy: Patient Spontanous Breathing and Patient connected to face mask oxygen  Post-op Assessment: Report given to RN and Post -op Vital signs reviewed and stable  Post vital signs: Reviewed and stable  Last Vitals:  Vitals Value Taken Time  BP    Temp    Pulse 91 06/27/19 1013  Resp 18 06/27/19 1013  SpO2 100 % 06/27/19 1013  Vitals shown include unvalidated device data.  Last Pain:  Vitals:   06/27/19 0929  TempSrc: Oral  PainSc: 0-No pain         Complications: No apparent anesthesia complications

## 2019-06-27 NOTE — Anesthesia Preprocedure Evaluation (Signed)
Anesthesia Evaluation  Patient identified by MRN, date of birth, ID band Patient awake    Reviewed: Allergy & Precautions, NPO status , Patient's Chart, lab work & pertinent test results  Airway Mallampati: II  TM Distance: >3 FB Neck ROM: Full    Dental  (+) Teeth Intact, Dental Advisory Given   Pulmonary asthma ,    breath sounds clear to auscultation       Cardiovascular  Rhythm:Regular Rate:Normal     Neuro/Psych  Headaches, PSYCHIATRIC DISORDERS Anxiety Depression    GI/Hepatic GERD  ,  Endo/Other    Renal/GU      Musculoskeletal  (+) Fibromyalgia -  Abdominal   Peds  Hematology   Anesthesia Other Findings   Reproductive/Obstetrics                             Anesthesia Physical  Anesthesia Plan  ASA: II  Anesthesia Plan: MAC   Post-op Pain Management:    Induction:   PONV Risk Score and Plan: Ondansetron, Propofol infusion and Treatment may vary due to age or medical condition  Airway Management Planned: Natural Airway and Nasal Cannula  Additional Equipment:   Intra-op Plan:   Post-operative Plan:   Informed Consent: I have reviewed the patients History and Physical, chart, labs and discussed the procedure including the risks, benefits and alternatives for the proposed anesthesia with the patient or authorized representative who has indicated his/her understanding and acceptance.     Dental advisory given  Plan Discussed with: CRNA and Anesthesiologist  Anesthesia Plan Comments:         Anesthesia Quick Evaluation

## 2019-06-27 NOTE — Op Note (Signed)
[facility] Patient Name: Tara Howard Procedure Date: 06/27/2019 MRN: [id number] Attending MD: Gerrit Heck , MD Date of Birth: Nov 19, 1970 CSN: [id number] Age: 48 Admit Type: Outpatient Procedure:                Upper GI endoscopy Indications:              Heartburn, Suspected esophageal reflux, Bravo                            placement Providers:                Gerrit Heck, MD, Jobe Igo, RN, Lina Sar, Marin Shutter CRNA Referring MD:              Medicines:                Monitored Anesthesia Care Complications:            No immediate complications. Estimated Blood Loss:     Estimated blood loss: none. Procedure:                Pre-Anesthesia Assessment:                           - Prior to the procedure, a History and Physical                            was performed, and patient medications and                            allergies were reviewed. The patient's tolerance of                            previous anesthesia was also reviewed. The risks                            and benefits of the procedure and the sedation                            options and risks were discussed with the patient.                            All questions were answered, and informed consent                            was obtained. Prior Anticoagulants: The patient has                            taken no previous anticoagulant or antiplatelet                            agents. ASA Grade Assessment: II - A patient with                            mild systemic  disease. After reviewing the risks                            and benefits, the patient was deemed in                            satisfactory condition to undergo the procedure.                           After obtaining informed consent, the endoscope was                            passed under direct vision. Throughout the                            procedure, the  patient's blood pressure, pulse, and                            oxygen saturations were monitored continuously. The                            [id number]-H190 [id number] Olympus gastroscope was                            introduced through the mouth, and advanced to the                            second part of duodenum. The upper GI endoscopy was                            accomplished without difficulty. The patient                            tolerated the procedure well. 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      The middle third of the esophagus, lower third of the esophagus and       gastroesophageal junction were normal. The BRAVO capsule with delivery       system was introduced through the mouth and advanced into the esophagus,       such that the BRAVO pH capsule was positioned 32 cm from the incisors,       which was 6 cm proximal to the GE junction. The BRAVO pH capsule was       then deployed and attached to the esophageal mucosa. The delivery system       was then withdrawn. Endoscopy was utilized for probe placement and       diagnostic evaluation. The scope was reinserted to evaluate placement of       the BRAVO capsule. Visualization showed the BRAVO capsule to be in an       appropriate position.      The gastroesophageal flap valve was visualized endoscopically and       classified as Hill Grade II (fold present, opens with respiration).  Normal Z line at 38 cm.      The entire examined stomach was normal.      The duodenal bulb, first portion of the duodenum and second portion of       the duodenum were normal. Impression:               - Ectopic gastric mucosa in the upper third of the                            esophagus.                           - Normal middle third of esophagus, lower third of                            esophagus and gastroesophageal junction.                            - Gastroesophageal flap valve classified as Hill                            Grade II (fold present, opens with respiration).                           - Normal stomach.                           - Normal duodenal bulb, first portion of the                            duodenum and second portion of the duodenum.                           - The BRAVO pH capsule was deployed.                           - No specimens collected. Moderate Sedation:      Not Applicable - Patient had care per Anesthesia. Recommendation:           - Patient has a contact number available for                            emergencies. The signs and symptoms of potential                            delayed complications were discussed with the                            patient. Return to normal activities tomorrow.                            Written discharge instructions were provided to the                            patient.                           -  Resume previous diet.                           - Continue present medications.                           - Return to GI clinic at appointment to be                            scheduled. Procedure Code(s):        --- Professional ---                           [phone], Esophagogastroduodenoscopy, flexible,                            transoral; diagnostic, including collection of                            specimen(s) by brushing or washing, when performed                            (separate procedure) Diagnosis Code(s):        --- Professional ---                           Q40.2, Other specified congenital malformations of                            stomach                           K21.9, Gastro-esophageal reflux disease without                            esophagitis CPT copyright 2019 American Medical Association. All rights reserved. The codes documented in this report are preliminary and upon coder review may  be revised to meet current compliance  requirements. Gerrit Heck, MD 06/27/2019 10:24:06 AM Number of Addenda: 0

## 2019-06-27 NOTE — H&P (Signed)
P  Chief Complaint:    GERD  GI History: 48 year old female with history of Breast CA (diagnosed 2020 now s/p mastectomy for cure), IBS-C, GERD, presents today for EGD with Bravo placement for further evaluation of her reflux.  1) Hx of IBS-C.Index sxs were constipation predominant. Has a long hx ofpreviously diagnosedIBS-C, which was well controlled with dietary mods, high fiber diet, and Miralax.Intermittentgeneralizedabdominal cramping and abdominal bloatingw/visible abdominal distension. No n/v/f/c. Previously tried [employer], and stopped due to diarrhea and abdominal pain after a few days.  Trialed Amitiza in 08/2018, but not effective (although may have only tried the 8 MCG dose).  Started on Trulance, which initially worked, but has since reverted back to constipation.  Otherwise, no hematochezia, melena.   2)Hx of GERD, treated with Nexium for years, daily with occassional need for 2nd dose for breakthrough sxs.Index sxs of HB and regurgitation, but has been worsening lately with increasing breakthrough sxs. EGD in 2012 was only n/f gastritis and esophagitis per patient. No dysphagia or odynophagia, and no early satiety.  For ongoing reflux, started on Dexilant, which was initially quite effective, but seems to have lost efficacy with breakthrough 3 days/week.  EGD essentially normal in 08/2018.  Endoscopic Hx: - EGD (09/2010): Erythema in antrum compatible with gastritis. Otherwise normal. No actual report in EMR for review. - EGD (08/2018, Dr. Bryan Lemma): Normal -EGD with Bravo placement (03/2019, Dr. Bryan Lemma): Gastric inlet patch, normal Z-line, Hill grade 2, normal stomach/duodenum.  Bravo attached, but unfortunately device not functional with equipment and no recording -Colonoscopy (08/2018, Dr. Bryan Lemma): 2 small benign polyps in transverse colon, suboptimal prep.  Biopsies negative for [facility]  HPI:     Patient is a 48 y.o. female presenting today for repeat EGD with  Bravo placement for further evaluation of reflux.  Continues to take Dexilant, but still having breakthrough symptoms.  Has held Spencer for the last 5 days in anticipation of the study.    Review of systems:     No chest pain, no SOB, no fevers, no urinary sx   Past Medical History:  Diagnosis Date  . 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 Anxiety   . Asthma   . Breast cancer ([place])   . Chronic headache   . Depression   . Fibromyalgia   . Fibromyalgia 2006   pain / headaches  . GERD (gastroesophageal reflux disease)   . IBS (irritable bowel syndrome)     Patient's surgical history, family medical history, social history, medications and allergies were all reviewed in Epic    Current Facility-Administered Medications  Medication Dose Route Frequency Provider Last Rate Last Dose  . 0.9 %  sodium chloride infusion   Intravenous Continuous Lezlee Gills V, DO       Facility-Administered Medications Ordered in Other Encounters  Medication Dose Route Frequency Provider Last Rate Last Dose  . lactated ringers infusion    Continuous PRN Silas [place], CRNA        Physical Exam:     BP 120/77   Pulse 86   Temp 98.7 F (37.1 C) (Oral)   Resp 20   Ht 5\' 4"  (1.626 m)   Wt 68 kg   LMP  (Approximate) Comment: LMP 12 years ago, abstinence  SpO2 100%   BMI 25.73 kg/m   GENERAL:  Pleasant female in NAD PSYCH: : Cooperative, normal affect EENT:  conjunctiva pink, mucous membranes moist, neck supple without masses CARDIAC:  RRR, no murmur heard,  no peripheral edema PULM: Normal respiratory effort, lungs CTA bilaterally, no wheezing ABDOMEN:  Nondistended, soft, nontender. No obvious masses, no hepatomegaly,  normal bowel sounds SKIN:  turgor, no lesions seen Musculoskeletal:  Normal muscle tone, normal strength NEURO: Alert and oriented x 3, no focal neurologic deficits   IMPRESSION and PLAN:     1) GERD: -Plan for EGD with Bravo placement today -Hold Dexilant through the  duration of the study.  Has been holding for the last 5 days -Already taking venlafaxine, which could have neuro modulating effect if superimposed esophageal hypersensitivity.  Evaluate Bravo results -To follow-up with me in the GI clinic to review Bravo results  The indications, risks, and benefits of EGD with Bravo placement were explained to the patient in detail. Risks include but are not limited to bleeding, perforation, adverse reaction to medications, and cardiopulmonary compromise. Sequelae include but are not limited to the possibility of surgery, hositalization, and mortality. The patient verbalized understanding and wished to proceed. All questions answered, referred to scheduler. Further recommendations pending results of the exam.            Lavena Bullion ,DO, FACG 06/27/2019, 9:31 AM

## 2019-06-27 NOTE — Interval H&P Note (Signed)
History and Physical Interval Note:  06/27/2019 9:37 AM  Tara Howard  has presented today for surgery, with the diagnosis of GERD.  The various methods of treatment have been discussed with the patient and family. After consideration of risks, benefits and other options for treatment, the patient has consented to  Procedure(s): ESOPHAGOGASTRODUODENOSCOPY (EGD) WITH PROPOFOL (N/A) BRAVO PH STUDY (N/A) as a surgical intervention.  The patient's history has been reviewed, patient examined, no change in status, stable for surgery.  I have reviewed the patient's chart and labs.  Questions were answered to the patient's satisfaction.     [name]

## 2019-06-28 ENCOUNTER — Encounter (HOSPITAL_COMMUNITY): Payer: Self-pay | Admitting: Gastroenterology

## 2019-07-09 ENCOUNTER — Telehealth: Payer: Self-pay | Admitting: Gastroenterology

## 2019-07-09 NOTE — Telephone Encounter (Signed)
Please review and advise on egd with bravo placement results as requested by patient

## 2019-07-09 NOTE — Telephone Encounter (Signed)
Bravo results still not finalized. Will update as soon as resulted. Thanks.

## 2019-07-10 ENCOUNTER — Ambulatory Visit (INDEPENDENT_AMBULATORY_CARE_PROVIDER_SITE_OTHER): Payer: BC Managed Care – PPO | Admitting: Sports Medicine

## 2019-07-10 ENCOUNTER — Other Ambulatory Visit: Payer: Self-pay

## 2019-07-10 DIAGNOSIS — M7062 Trochanteric bursitis, left hip: Secondary | ICD-10-CM

## 2019-07-10 MED ORDER — TRAMADOL HCL 50 MG PO TABS: 50.0000 mg | ORAL_TABLET | Freq: Three times a day (TID) | ORAL | 0 refills | Status: DC | PRN

## 2019-07-10 NOTE — Progress Notes (Signed)
Subjective:    CC: Left hip pain  HPI: This is a pleasant 48 year old female, she has left hip pain, lateral, last injected about 6 months ago.  Unfortunately she is having recurrence of pain, she has started her rehab exercises but not progress due to the discomfort.  Localized on the lateral hip, worse laying on the ipsilateral side.  Moderate weakness.  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  I reviewed the past medical history, family history, social history, surgical history, and allergies today and no changes were needed.  Please see the problem list section below in epic for further details.  Past Medical History: Past Medical History:  Diagnosis Date  . 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 Anxiety   . Asthma   . Breast cancer ([place])   . Chronic headache   . Depression   . Fibromyalgia   . Fibromyalgia 2006   pain / headaches  . GERD (gastroesophageal reflux disease)   . IBS (irritable bowel syndrome)    Past Surgical History: Past Surgical History:  Procedure Laterality Date  . bilateral mastectomy  11/01/2018  . BRAVO [place] STUDY N/A 03/26/2019   Procedure: BRAVO [place];  Surgeon: Lavena Bullion, DO;  Location: [facility] ENDOSCOPY;  Service: Gastroenterology;  Laterality: N/A;  . BRAVO [place] STUDY N/A 06/27/2019   Procedure: BRAVO [place];  Surgeon: Lavena Bullion, DO;  Location: [facility]OSCOPY;  Service: Gastroenterology;  Laterality: N/A;  . BREAST LUMPECTOMY Left 09/2018  . BREAST RECONSTRUCTION  12/2018  . CESAREAN SECTION     X 2  . ENDOMETRIAL ABLATION    . ESOPHAGOGASTRODUODENOSCOPY     said it was done maybe about 10 years ago. In [place]  . ESOPHAGOGASTRODUODENOSCOPY (EGD) WITH PROPOFOL N/A 03/26/2019   Procedure: ESOPHAGOGASTRODUODENOSCOPY (EGD) WITH PROPOFOL;  Surgeon: Lavena Bullion, DO;  Location: [facility];  Service: Gastroenterology;  Laterality: N/A;  . ESOPHAGOGASTRODUODENOSCOPY (EGD) WITH PROPOFOL N/A 06/27/2019   Procedure:  ESOPHAGOGASTRODUODENOSCOPY (EGD) WITH PROPOFOL;  Surgeon: Lavena Bullion, DO;  Location: [facility];  Service: Gastroenterology;  Laterality: N/A;  . TONSILLECTOMY    . TONSILLECTOMY    . TUBAL LIGATION    . UPPER GASTROINTESTINAL ENDOSCOPY     Social History: Social History   Socioeconomic History  . Marital status: Legally Separated    Spouse name: Not on file  . Number of children: 2  . Years of education: Not on file  . Highest education level: Not on file  Occupational History  . Occupation: Wellsite geologist: [employer]    Comment: [employer]  Tobacco Use  . Smoking status: Never Smoker  . Smokeless tobacco: Never Used  Substance and Sexual Activity  . Alcohol use: Yes    Comment: rarely   . Drug use: Never  . Sexual activity: Yes  Other Topics Concern  . Not on file  Social History Narrative  . Not on file   Social Determinants of Health   Financial Resource Strain:   . Difficulty of Paying Living Expenses: Not on file  Food Insecurity:   . Worried About Charity fundraiser in the Last Year: Not on file  . Ran Out of Food in the Last Year: Not on file  Transportation Needs:   . Lack of Transportation (Medical): Not on file  . Lack of Transportation (Non-Medical): Not on file  Physical Activity:   . Days of Exercise per  Week: Not on file  . Minutes of Exercise per Session: Not on file  Stress:   . Feeling of Stress : Not on file  Social Connections:   . Frequency of Communication with Friends and Family: Not on file  . Frequency of Social Gatherings with Friends and Family: Not on file  . Attends Religious Services: Not on file  . Active Member of Clubs or Organizations: Not on file  . Attends Archivist Meetings: Not on file  . Marital Status: Not on file   Family History: Family History  Problem Relation Age of Onset  . CAD Father        3 vessel bypass  . Depression Mother   . Hypertension Mother    . Heart attack Mother   . CAD Mother        3 vessel bypass  . Irritable bowel syndrome Mother   . Aneurysm Mother        brain  . Cancer Paternal Grandmother        of the bowel per patient   . Esophageal cancer Paternal Grandmother    Allergies: Allergies  Allergen Reactions  . Azithromycin Diarrhea, Nausea And Vomiting and Other (See Comments)       . Augmentin [Amoxicillin-Pot Clavulanate] Diarrhea, Nausea And Vomiting and Other (See Comments)    Stomach cramps Did it involve swelling of the face/tongue/throat, SOB, or low BP? No Did it involve sudden or severe rash/hives, skin peeling, or any reaction on the inside of your mouth or nose? No Did you need to seek medical attention at a hospital or doctor's office? No When did it last happen?More than 3 years ago If all above answers are "NO", may proceed with cephalosporin use.   . Clonidine Derivatives Rash  . Flexeril [Cyclobenzaprine] Other (See Comments)    constipation  . Linzess [Linaclotide] Other (See Comments)    cramping  . Oxybutynin Rash  . Zolpidem Tartrate Other (See Comments)    Hallucinations   Medications: See med rec.  Review of Systems: No fevers, chills, night sweats, weight loss, chest pain, or shortness of breath.   Objective:    General: Well Developed, well nourished, and in no acute distress.  Neuro: Alert and oriented x3, extra-ocular muscles intact, sensation grossly intact.  HEENT: Normocephalic, atraumatic, pupils equal round reactive to light, neck supple, no masses, no lymphadenopathy, thyroid nonpalpable.  Skin: Warm and dry, no rashes. Cardiac: Regular rate and rhythm, no murmurs rubs or gallops, no lower extremity edema.  Respiratory: Clear to auscultation bilaterally. Not using accessory muscles, speaking in full sentences. Left hip: ROM IR: 60 Deg, ER: 60 Deg, Flexion: 120 Deg, Extension: 100 Deg, Abduction: 45 Deg, Adduction: 45 Deg Strength IR: 5/5, ER: 5/5, Flexion: 5/5,  Extension: 5/5, Abduction: 5/5, Adduction: 5/5 Pelvic alignment unremarkable to inspection and palpation. Standing hip rotation and gait without trendelenburg / unsteadiness. Greater trochanter with severe tenderness to palpation. No tenderness over piriformis. No SI joint tenderness and normal minimal SI movement.  Procedure: Real-time Ultrasound Guided injection of the left greater trochanteric bursa Device: Samsung HS60  Verbal informed consent obtained.  Time-out conducted.  Noted no overlying erythema, induration, or other signs of local infection.  Skin prepped in a sterile fashion.  Local anesthesia: Topical Ethyl chloride.  With sterile technique and under real time ultrasound guidance: 1 cc Kenalog 40, 2 cc lidocaine, 2 cc bupivacaine injected easily Completed without difficulty  Pain immediately resolved suggesting accurate placement of the  medication.  Advised to call if fevers/chills, erythema, induration, drainage, or persistent bleeding.  Images permanently stored and available for review in the ultrasound unit.  Impression: Technically successful ultrasound guided injection.  Impression and Recommendations:    Greater trochanteric bursitis of left hip Repeat left greater trochanteric bursa injection, previously done in the summertime. Use Celebrex and tramadol as needed for breakthrough pain. Return to see me in a month as needed.   ___________________________________________ Gwen Her. Dianah Field, M.D., ABFM., CAQSM. Primary Care and Sports Medicine [facility]  Adjunct Professor of [place] [employer]

## 2019-07-10 NOTE — Assessment & Plan Note (Addendum)
Repeat left greater trochanteric bursa injection, previously done in the summertime. Use Celebrex and tramadol as needed for breakthrough pain. Return to see me in a month as needed.

## 2019-07-29 ENCOUNTER — Other Ambulatory Visit: Payer: Self-pay | Admitting: Family Medicine

## 2019-07-31 ENCOUNTER — Ambulatory Visit: Payer: BC Managed Care – PPO | Admitting: Gastroenterology

## 2019-08-07 ENCOUNTER — Other Ambulatory Visit: Payer: Self-pay

## 2019-08-07 ENCOUNTER — Ambulatory Visit: Payer: BC Managed Care – PPO | Admitting: Sports Medicine

## 2019-08-07 DIAGNOSIS — M1612 Unilateral primary osteoarthritis, left hip: Secondary | ICD-10-CM | POA: Diagnosis not present

## 2019-08-07 MED ORDER — HYDROCODONE-ACETAMINOPHEN 5-325 MG PO TABS: 1.0000 | ORAL_TABLET | Freq: Three times a day (TID) | ORAL | 0 refills | Status: DC | PRN

## 2019-08-07 NOTE — Addendum Note (Signed)
Addended by: Silverio Decamp on: 08/07/2019 04:10 PM   Modules accepted: Orders

## 2019-08-07 NOTE — Assessment & Plan Note (Addendum)
Tara Howard returns, she still has significant pain in her left hip with radiation to the anterior left thigh. We did a left greater trochanteric bursa injection about a month ago, she did not really have much relief of her hip pain, today her pain is referrable more to the joint, she has reproduction of pain with internal rotation of her left hip, so we did perform a left femoroacetabular joint injection. Tramadol was ineffective, increasing to hydrocodone. Return to see me in 1 month to reevaluate. If this fails then she will unfortunately need a total hip arthroplasty.

## 2019-08-07 NOTE — Progress Notes (Addendum)
    Procedures performed today:    Procedure: Real-time Ultrasound Guided injection of the left femoroacetabular joint Device: Samsung HS60  Verbal informed consent obtained.  Time-out conducted.  Noted no overlying erythema, induration, or other signs of local infection.  Skin prepped in a sterile fashion.  Local anesthesia: Topical Ethyl chloride.  With sterile technique and under real time ultrasound guidance: 1 cc Kenalog 40, 2 cc lidocaine, 2 cc bupivacaine injected easily Completed without difficulty  Pain immediately resolved suggesting accurate placement of the medication.  Advised to call if fevers/chills, erythema, induration, drainage, or persistent bleeding.  Images permanently stored and available for review in the ultrasound unit.  Impression: Technically successful ultrasound guided injection.  Independent interpretation of tests performed by another provider:   None.  Impression and Recommendations:    Primary osteoarthritis of left hip Tara Howard returns, she still has significant pain in her left hip with radiation to the anterior left thigh. We did a left greater trochanteric bursa injection about a month ago, she did not really have much relief of her hip pain, today her pain is referrable more to the joint, she has reproduction of pain with internal rotation of her left hip, so we did perform a left femoroacetabular joint injection. Tramadol was ineffective, increasing to hydrocodone. Return to see me in 1 month to reevaluate. If this fails then she will unfortunately need a total hip arthroplasty.    ___________________________________________ Gwen Her. Dianah Field, M.D., ABFM., CAQSM. Primary Care and [place] Instructor of [place] [employer]

## 2019-08-12 ENCOUNTER — Telehealth (INDEPENDENT_AMBULATORY_CARE_PROVIDER_SITE_OTHER): Payer: BC Managed Care – PPO | Admitting: Gastroenterology

## 2019-08-12 ENCOUNTER — Encounter: Payer: Self-pay | Admitting: Gastroenterology

## 2019-08-12 ENCOUNTER — Other Ambulatory Visit: Payer: Self-pay

## 2019-08-12 VITALS — Ht 64.0 in | Wt 138.0 lb

## 2019-08-12 DIAGNOSIS — R12 Heartburn: Secondary | ICD-10-CM

## 2019-08-12 DIAGNOSIS — K219 Gastro-esophageal reflux disease without esophagitis: Secondary | ICD-10-CM | POA: Diagnosis not present

## 2019-08-12 DIAGNOSIS — K581 Irritable bowel syndrome with constipation: Secondary | ICD-10-CM | POA: Diagnosis not present

## 2019-08-12 NOTE — Progress Notes (Signed)
Chief Complaint: Heartburn   GI History: 49 year old female with history of Breast CA (diagnosed 2020 now s/p mastectomy for cure), IBS-C, GERD.  1) Hx of IBS-C.Index sxs were constipation predominant. Has a long hx ofpreviously diagnosedIBS-C, which was well controlled with dietary mods, high fiber diet, and Miralax.Intermittentgeneralizedabdominal cramping and abdominal bloatingw/visible abdominal distension. No n/v/f/c. Previously tried [employer], and stopped due to diarrhea and abdominal pain after a few days.  Trialed Amitiza in 08/2018, but not effective (although may have only tried the 8 MCG dose).  Started on Trulance, which initially worked, but has since reverted back to constipation.  Sitz marker study with retained stool but no retained markers in 02/2019.  Trialed MiraLAX with plan to consider going back to Amitiza (higher dose) or trial Motegrity pending response.  Otherwise, no hematochezia, melena.   2)Hx of Heartburn/Esophageal hypersensitivity: Longstanding history of heartburn and regurgitation, treated with Nexium for years, daily with occassional need for 2nd dose for breakthrough sxs. Worsening sxs in 2020 with increasing breakthrough sxs. EGD in 2012 was only n/f gastritis and esophagitis per patient. No dysphagia or odynophagia, and no early satiety.  For ongoing reflux, started on Dexilant, which was initially quite effective, but seems to have lost efficacy with breakthrough 3 days/week.  EGD essentially normal in 08/2018.  Endoscopic Hx: - EGD (09/2010): Erythema in antrum compatible with gastritis. Otherwise normal. No actual report in EMR for review. - EGD (08/2018, Dr. Bryan Lemma): Normal -Colonoscopy (08/2018, Dr. Bryan Lemma): 2 small benign polyps in transverse colon, suboptimal prep.  Biopsies negative for [facility] -EGD with Bravo (03/2019, Dr. Bryan Lemma): Gastric inlet patch, regular Z-line, normal stomach/duodenum.  Hill grade 2.  Bravo attached but  failed communication with recorder -EGD with Bravo (06/2019, Dr. Bryan Lemma): Gastric inlet patch, regular Z-line, normal stomach/duodenum.  Hill grade 2.  Bravo placed -Bravo (06/2019): Normal, [place] 3.4.  Good symptom correlation for heartburn with reflux.  Normal number of reflux events.  Consistent with esophageal hypersensitivity.   HPI:    Due to current restrictions/limitations of in-office visits due to the COVID-19 pandemic, this scheduled clinical appointment was converted to a telehealth virtual consultation using Doximity. She had issues connecting through Doximity, so converted to telephone call.   -Time of medical discussion: 24 minutes -The patient did consent to this virtual visit and is aware of possible charges through their insurance for this visit.  -Names of all parties present: Tara Howard (patient), Gerrit Heck, DO, [facility] (physician) -Patient location: Home -Physician location: Office  Tara Howard is a 49 y.o. female presenting to the Gastroenterology Clinic for follow-up.   Reflux sxs generally controlled. Still breakthrough with potato chips, spicy, tomato based foods, fried foods. Avoids juice, soda, coffee. Still taking Dexilant.Takes Tums prn- few days/week. Has been taking Effexor since CA dx in 2020. No dysphagia.   Constipation well controlled on current regimen- Miralax BID, fruit, fiber supplement.   Past medical history, past surgical history, social history, family history, medications, and allergies reviewed in the chart and with patient.    Past Medical History:  Diagnosis Date  . 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 Anxiety   . Asthma   . Breast cancer ([place])   . Chronic headache   . Depression   . Fibromyalgia   . Fibromyalgia 2006   pain / headaches  . GERD (gastroesophageal reflux disease)   . IBS (irritable bowel syndrome)   . Osteoarthritis  in her hips     Past Surgical History:  Procedure Laterality Date  . bilateral  mastectomy  11/01/2018  . BRAVO [place] STUDY N/A 03/26/2019   Procedure: BRAVO [place];  Surgeon: Lavena Bullion, DO;  Location: [facility];  Service: Gastroenterology;  Laterality: N/A;  . BRAVO [place] STUDY N/A 06/27/2019   Procedure: BRAVO [place];  Surgeon: Lavena Bullion, DO;  [facility];  Service: Gastroenterology;  Laterality: N/A;  . BREAST LUMPECTOMY Left 09/2018  . BREAST RECONSTRUCTION  12/2018  . CESAREAN SECTION     X 2  . ENDOMETRIAL ABLATION    . ESOPHAGOGASTRODUODENOSCOPY     said it was done maybe about 10 years ago. In [place]  . ESOPHAGOGASTRODUODENOSCOPY (EGD) WITH PROPOFOL N/A 03/26/2019   Procedure: ESOPHAGOGASTRODUODENOSCOPY (EGD) WITH PROPOFOL;  Surgeon: Lavena Bullion, DO;  [facility];  Service: Gastroenterology;  Laterality: N/A;  . ESOPHAGOGASTRODUODENOSCOPY (EGD) WITH PROPOFOL N/A 06/27/2019   Procedure: ESOPHAGOGASTRODUODENOSCOPY (EGD) WITH PROPOFOL;  Surgeon: Lavena Bullion, DO;  [facility];  Service: Gastroenterology;  Laterality: N/A;  . TONSILLECTOMY    . TONSILLECTOMY    . TUBAL LIGATION    . UPPER GASTROINTESTINAL ENDOSCOPY     Family History  Problem Relation Age of Onset  . CAD Father        3 vessel bypass  . Depression Mother   . Hypertension Mother   . Heart attack Mother   . CAD Mother        3 vessel bypass  . Irritable bowel syndrome Mother   . Aneurysm Mother        brain  . Cancer Paternal Grandmother        of the bowel per patient   . Esophageal cancer Paternal Grandmother    Social History   Tobacco Use  . Smoking status: Never Smoker  . Smokeless tobacco: Never Used  Substance Use Topics  . Alcohol use: Yes    Comment: rarely   . Drug use: Never   Current Outpatient Medications  Medication Sig Dispense Refill  . B Complex-C (B-COMPLEX WITH VITAMIN C) tablet Take 1 tablet by mouth daily.    . Cholecalciferol (DIALYVITE VITAMIN D 5000) 125 MCG (5000 UT) capsule Take 5,000  Units by mouth daily.    Marland Kitchen dexlansoprazole (DEXILANT) 60 MG capsule Take 60 mg by mouth daily.    . diazepam (VALIUM) 5 MG tablet Take 5 mg by mouth as needed for anxiety.    . fluticasone (FLONASE) 50 MCG/ACT nasal spray Place 1 spray into both nostrils daily.    Marland Kitchen Hyaluronic Acid-Vitamin C (HYALURONIC ACID PO) Take 2 tablets by mouth daily.     Marland Kitchen HYDROcodone-acetaminophen (NORCO/VICODIN) 5-325 MG tablet Take 1 tablet by mouth every 8 (eight) hours as needed for moderate pain. 15 tablet 0  . levalbuterol (XOPENEX HFA) 45 MCG/ACT inhaler Inhale 2 puffs into the lungs every 6 (six) hours as needed for wheezing or shortness of breath. Pt will call when needs to filled. 15 g 1  . Multiple Vitamins-Minerals (HAIR SKIN AND NAILS FORMULA PO) Take 2 tablets by mouth daily.    . Multiple Vitamins-Minerals (MULTIVITAMIN WITH MINERALS) tablet Take 1 tablet by mouth daily.    Vladimir Faster Glycol-Propyl Glycol (SYSTANE OP) Place 1 drop into both eyes daily.    . polyethylene glycol (MIRALAX / GLYCOLAX) 17 g packet Take 17 g by mouth 2 (two) times daily.     . pregabalin (LYRICA) 200  MG capsule TAKE ONE CAPSULE BY MOUTH TWICE DAILY (Patient taking differently: Take 200 mg by mouth daily. ) 180 capsule 0  . QVAR REDIHALER 80 MCG/ACT inhaler Inhale 2 puffs into the lungs 2 (two) times daily.     . tamoxifen (NOLVADEX) 20 MG tablet Take 20 mg by mouth at bedtime.    . traMADol (ULTRAM) 50 MG tablet Take 50 mg by mouth as needed.    . traZODone (DESYREL) 100 MG tablet TAKE 1 & 1/2 TABLETS BY MOUTH AT BEDTIME (Patient taking differently: Take 150 mg by mouth at bedtime. TAKE 1 & 1/2 TABLETS BY MOUTH AT BEDTIME) 135 tablet 3  . venlafaxine (EFFEXOR) 75 MG tablet Take 1 tablet (75 mg total) by mouth daily. 90 tablet 0  . acetaminophen (TYLENOL) 500 MG tablet Take 1,500 mg by mouth 2 (two) times daily as needed for moderate pain or headache.    . celecoxib (CELEBREX) 100 MG capsule One to 2 tablets by mouth daily as  needed for pain. (Patient not taking: Reported on 08/12/2019) 60 capsule 3   No current facility-administered medications for this visit.   Allergies  Allergen Reactions  . Azithromycin Diarrhea, Nausea And Vomiting and Other (See Comments)       . Augmentin [Amoxicillin-Pot Clavulanate] Diarrhea, Nausea And Vomiting and Other (See Comments)    Stomach cramps Did it involve swelling of the face/tongue/throat, SOB, or low BP? No Did it involve sudden or severe rash/hives, skin peeling, or any reaction on the inside of your mouth or nose? No Did you need to seek medical attention at a hospital or doctor's office? No When did it last happen?More than 3 years ago If all above answers are "NO", may proceed with cephalosporin use.   . Clonidine Derivatives Rash  . Flexeril [Cyclobenzaprine] Other (See Comments)    constipation  . Linzess [Linaclotide] Other (See Comments)    cramping  . Oxybutynin Rash  . Zolpidem Tartrate Other (See Comments)    Hallucinations     Review of Systems: All systems reviewed and negative except where noted in HPI.     Physical Exam:    Complete physical exam not completed due to the nature of this telehealth communication.   Gen: Awake, alert, and oriented, and well communicative. HEENT: EOMI, non-icteric sclera, NCAT, MMM Neck: Normal movement of head and neck Pulm: No labored breathing, speaking in full sentences without conversational dyspnea Derm: No apparent lesions or bruising in visible field MS: Moves all visible extremities without noticeable abnormality Psych: Pleasant, cooperative, normal speech, thought processing seemingly intact   ASSESSMENT AND PLAN;   1) Heartburn 2) Esophageal hypersensitivity  - Add Pepcid 20 mg qhs for continued heartburn symptoms - Resume Dexilant -No plan for antireflux surgery at this juncture -Continue avoiding exacerbating foods -Resume venlafaxine as already taking  3) IBS-C: -Well-controlled  on current regimen -No change in therapy at this time  RTC in 6-12 months or sooner as needed   Lavena Bullion, DO, FACG  08/12/2019, 1:11 PM   Hali Marry, *

## 2019-08-12 NOTE — Patient Instructions (Signed)
Return in 6 months  It was a pleasure to see you today!  Vito Cirigliano, D.O.

## 2019-08-19 ENCOUNTER — Other Ambulatory Visit: Payer: Self-pay

## 2019-08-19 ENCOUNTER — Ambulatory Visit: Payer: BC Managed Care – PPO | Admitting: Medical-Surgical

## 2019-08-19 ENCOUNTER — Ambulatory Visit: Payer: BC Managed Care – PPO | Admitting: Family Medicine

## 2019-08-19 ENCOUNTER — Encounter: Payer: Self-pay | Admitting: Family Medicine

## 2019-08-19 VITALS — BP 114/65 | HR 88 | Ht 64.0 in | Wt 148.0 lb

## 2019-08-19 DIAGNOSIS — S21002A Unspecified open wound of left breast, initial encounter: Secondary | ICD-10-CM

## 2019-08-19 DIAGNOSIS — M542 Cervicalgia: Secondary | ICD-10-CM | POA: Diagnosis not present

## 2019-08-19 NOTE — Patient Instructions (Addendum)
Clean the area on your breast with Hibiclens just with your fingertips and then rinse with water.  When you get out of the shower pat dry and then apply the Xeroform gauze and then a nonstick gauze on top.  Change the dressing daily.  I like to see back in a week just to make sure that it is healing. Apply Aquaphor twice a day to the spot on the right breast.   By the exercises for your neck.   Can try Massage therapist Renay Perrell.  Can contact her for pricing. She is in [place] and really sweet.   [phone].

## 2019-08-19 NOTE — Progress Notes (Signed)
Acute Office Visit  Subjective:    Patient ID: Tara Howard, female    DOB: 1971/06/18, 49 y.o.   MRN: [id number]  Chief Complaint  Patient presents with  . Wound Infection    L breast  . Neck Pain    HPI   Patient is in today for wound on her right breast.  She says about 4 weeks ago she had the nipple tattooed onto her breast where she had reconstruction.  Reports that within a couple days afterwards she noticed that where she had been numb the skin was actually bruising and peeling and started to drain.  She then reached out to the nurse who had actually done the tattooing and they recommended that she start applying over-the-counter Neosporin type product and keep it covered she did that for a couple of days and then had her follow-up with her oncologist last Friday on January 22.  She recommended just maybe leaving it open to the air and trying not to keep it covered.  She still has had a little bit of drainage coming from the area but no fevers or chills.  She says it actually feels numb since the surgery.  Also complains of upper cervical pain and headaches that have been going on for several weeks.  In fact she actually went to see a massage therapist this past weekend and says that actually was helpful but it is quite expensive.  She wondered if there had any recommendation for anyone else in the area that might be a little less expensive.  Past Medical History:  Diagnosis Date  . 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 Anxiety   . Asthma   . Breast cancer ([place])   . Chronic headache   . Depression   . Fibromyalgia   . Fibromyalgia 2006   pain / headaches  . GERD (gastroesophageal reflux disease)   . IBS (irritable bowel syndrome)   . Osteoarthritis    in her hips    Past Surgical History:  Procedure Laterality Date  . bilateral mastectomy  11/01/2018  . BRAVO [place] STUDY N/A 03/26/2019   Procedure: BRAVO [place];  Surgeon: Lavena Bullion, DO;  [facility];   Service: Gastroenterology;  Laterality: N/A;  . BRAVO [place] STUDY N/A 06/27/2019   Procedure: BRAVO [place];  Surgeon: Lavena Bullion, DO;  [facility];  Service: Gastroenterology;  Laterality: N/A;  . BREAST LUMPECTOMY Left 09/2018  . BREAST RECONSTRUCTION  12/2018  . CESAREAN SECTION     X 2  . ENDOMETRIAL ABLATION    . ESOPHAGOGASTRODUODENOSCOPY     said it was done maybe about 10 years ago. In [place]  . ESOPHAGOGASTRODUODENOSCOPY (EGD) WITH PROPOFOL N/A 03/26/2019   Procedure: ESOPHAGOGASTRODUODENOSCOPY (EGD) WITH PROPOFOL;  Surgeon: Lavena Bullion, DO;  [facility];  Service: Gastroenterology;  Laterality: N/A;  . ESOPHAGOGASTRODUODENOSCOPY (EGD) WITH PROPOFOL N/A 06/27/2019   Procedure: ESOPHAGOGASTRODUODENOSCOPY (EGD) WITH PROPOFOL;  Surgeon: Lavena Bullion, DO;  [facility];  Service: Gastroenterology;  Laterality: N/A;  . TONSILLECTOMY    . TONSILLECTOMY    . TUBAL LIGATION    . UPPER GASTROINTESTINAL ENDOSCOPY      Family History  Problem Relation Age of Onset  . CAD Father        3 vessel bypass  . Depression Mother   . Hypertension Mother   . Heart attack Mother   . CAD Mother  3 vessel bypass  . Irritable bowel syndrome Mother   . Aneurysm Mother        brain  . Cancer Paternal Grandmother        of the bowel per patient   . Esophageal cancer Paternal Grandmother     Social History   Socioeconomic History  . Marital status: Legally Separated    Spouse name: Not on file  . Number of children: 2  . Years of education: Not on file  . Highest education level: Not on file  Occupational History  . Occupation: Wellsite geologist: [employer]    Comment: [employer]  Tobacco Use  . Smoking status: Never Smoker  . Smokeless tobacco: Never Used  Substance and Sexual Activity  . Alcohol use: Yes    Comment: rarely   . Drug use: Never  . Sexual activity: Yes  Other Topics Concern   . Not on file  Social History Narrative  . Not on file   Social Determinants of Health   Financial Resource Strain:   . Difficulty of Paying Living Expenses: Not on file  Food Insecurity:   . Worried About Charity fundraiser in the Last Year: Not on file  . Ran Out of Food in the Last Year: Not on file  Transportation Needs:   . Lack of Transportation (Medical): Not on file  . Lack of Transportation (Non-Medical): Not on file  Physical Activity:   . Days of Exercise per Week: Not on file  . Minutes of Exercise per Session: Not on file  Stress:   . Feeling of Stress : Not on file  Social Connections:   . Frequency of Communication with Friends and Family: Not on file  . Frequency of Social Gatherings with Friends and Family: Not on file  . Attends Religious Services: Not on file  . Active Member of Clubs or Organizations: Not on file  . Attends Archivist Meetings: Not on file  . Marital Status: Not on file  Intimate Partner Violence:   . Fear of Current or Ex-Partner: Not on file  . Emotionally Abused: Not on file  . Physically Abused: Not on file  . Sexually Abused: Not on file    Outpatient Medications Prior to Visit  Medication Sig Dispense Refill  . acetaminophen (TYLENOL) 500 MG tablet Take 1,500 mg by mouth 2 (two) times daily as needed for moderate pain or headache.    . B Complex-C (B-COMPLEX WITH VITAMIN C) tablet Take 1 tablet by mouth daily.    . celecoxib (CELEBREX) 100 MG capsule One to 2 tablets by mouth daily as needed for pain. 60 capsule 3  . Cholecalciferol (DIALYVITE VITAMIN D 5000) 125 MCG (5000 UT) capsule Take 5,000 Units by mouth daily.    Marland Kitchen dexlansoprazole (DEXILANT) 60 MG capsule Take 60 mg by mouth daily.    . diazepam (VALIUM) 5 MG tablet Take 5 mg by mouth as needed for anxiety.    . fluticasone (FLONASE) 50 MCG/ACT nasal spray Place 1 spray into both nostrils daily.    Marland Kitchen Hyaluronic Acid-Vitamin C (HYALURONIC ACID PO) Take 2 tablets  by mouth daily.     Marland Kitchen HYDROcodone-acetaminophen (NORCO/VICODIN) 5-325 MG tablet Take 1 tablet by mouth every 8 (eight) hours as needed for moderate pain. 15 tablet 0  . levalbuterol (XOPENEX HFA) 45 MCG/ACT inhaler Inhale 2 puffs into the lungs every 6 (six) hours as needed for wheezing or shortness of breath.  Pt will call when needs to filled. 15 g 1  . Multiple Vitamins-Minerals (HAIR SKIN AND NAILS FORMULA PO) Take 2 tablets by mouth daily.    . Multiple Vitamins-Minerals (MULTIVITAMIN WITH MINERALS) tablet Take 1 tablet by mouth daily.    Vladimir Faster Glycol-Propyl Glycol (SYSTANE OP) Place 1 drop into both eyes daily.    . polyethylene glycol (MIRALAX / GLYCOLAX) 17 g packet Take 17 g by mouth 2 (two) times daily.     . pregabalin (LYRICA) 200 MG capsule TAKE ONE CAPSULE BY MOUTH TWICE DAILY (Patient taking differently: Take 200 mg by mouth daily. ) 180 capsule 0  . QVAR REDIHALER 80 MCG/ACT inhaler Inhale 2 puffs into the lungs 2 (two) times daily.     . tamoxifen (NOLVADEX) 20 MG tablet Take 20 mg by mouth at bedtime.    . traMADol (ULTRAM) 50 MG tablet Take 50 mg by mouth as needed.    . traZODone (DESYREL) 100 MG tablet TAKE 1 & 1/2 TABLETS BY MOUTH AT BEDTIME (Patient taking differently: Take 150 mg by mouth at bedtime. TAKE 1 & 1/2 TABLETS BY MOUTH AT BEDTIME) 135 tablet 3  . venlafaxine (EFFEXOR) 75 MG tablet Take 1 tablet (75 mg total) by mouth daily. 90 tablet 0   No facility-administered medications prior to visit.    Allergies  Allergen Reactions  . Azithromycin Diarrhea, Nausea And Vomiting and Other (See Comments)       . Augmentin [Amoxicillin-Pot Clavulanate] Diarrhea, Nausea And Vomiting and Other (See Comments)    Stomach cramps Did it involve swelling of the face/tongue/throat, SOB, or low BP? No Did it involve sudden or severe rash/hives, skin peeling, or any reaction on the inside of your mouth or nose? No Did you need to seek medical attention at a hospital or  doctor's office? No When did it last happen?More than 3 years ago If all above answers are "NO", may proceed with cephalosporin use.   . Clonidine Derivatives Rash  . Flexeril [Cyclobenzaprine] Other (See Comments)    constipation  . Linzess [Linaclotide] Other (See Comments)    cramping  . Oxybutynin Rash  . Zolpidem Tartrate Other (See Comments)    Hallucinations    Review of Systems     Objective:    Physical Exam Vitals reviewed.  Constitutional:      Appearance: She is well-developed.  HENT:     Head: Normocephalic and atraumatic.  Eyes:     Conjunctiva/sclera: Conjunctivae normal.  Cardiovascular:     Rate and Rhythm: Normal rate.  Pulmonary:     Effort: Pulmonary effort is normal.  Musculoskeletal:     Comments: Cervical spine with normal flexion extension rotation right and left.  Tender just at the very top of the cervical spine and occiput area.  Also tender over the paraspinous muscles.  Nontender over the spine itself.  Skin:    General: Skin is dry.     Coloration: Skin is not pale.     Comments: 1.2 cm x 0.6 centimeters scab on the medial portion of the left breast, at the edge of the tattooed areola.  No active drainage.  A little bit of blood around the edge but no streaking erythema to indicate cellulitis.  On the right breast she has a very similar type scab but smaller approximately half a centimeter in a similar location on the right breast.  Neurological:     Mental Status: She is alert and oriented to person, place, and time.  Psychiatric:        Behavior: Behavior normal.     BP 114/65   Pulse 88   Ht 5\' 4"  (1.626 m)   Wt 148 lb (67.1 kg)   SpO2 100%   BMI 25.40 kg/m  Wt Readings from Last 3 Encounters:  08/19/19 148 lb (67.1 kg)  08/12/19 138 lb (62.6 kg)  07/10/19 152 lb (68.9 kg)    There are no preventive care reminders to display for this patient.  There are no preventive care reminders to display for this patient.   Lab  Results  Component Value Date   TSH 1.235 10/09/2014   Lab Results  Component Value Date   WBC 5.3 10/09/2014   HGB 13.8 10/09/2014   HCT 41.7 10/09/2014   MCV 94.1 10/09/2014   PLT 249 10/09/2014   Lab Results  Component Value Date   NA 140 08/08/2016   NA 140 08/08/2016   K 4.3 08/08/2016   K 4.3 08/08/2016   CO2 27 10/09/2014   GLUCOSE 84 10/09/2014   BUN 12 08/08/2016   BUN 18 08/08/2016   CREATININE 0.7 08/08/2016   CREATININE 0.7 08/08/2016   BILITOT 0.5 10/09/2014   ALKPHOS 51 08/08/2016   AST 15 08/08/2016   AST 15 08/08/2016   ALT 13 08/08/2016   ALT 13 08/08/2016   PROT 6.4 10/09/2014   ALBUMIN 4.3 10/09/2014   CALCIUM 9.2 10/09/2014   Lab Results  Component Value Date   CHOL 146 08/08/2016   CHOL 146 08/08/2016   Lab Results  Component Value Date   HDL 48 08/08/2016   HDL 48 08/08/2016   Lab Results  Component Value Date   LDLCALC 85 08/08/2016   LDLCALC 85 08/08/2016   Lab Results  Component Value Date   TRIG 65 08/08/2016   TRIG 65 08/08/2016   No results found for: CHOLHDL No results found for: HGBA1C     Assessment & Plan:   Problem List Items Addressed This Visit    None    Visit Diagnoses    Cervical spine pain    -  Primary   Wound, breast, left, initial encounter         Cervical spine pain-discussed doing home physical therapy handout with stretches given to do on her own at home.  In addition to conservative measures.  She really cannot take NSAIDs very well.  She did get some relief with the massage so she may want to consider having that done again which is quite expensive.  But can be really helpful.  We also discussed really looking her into her pillow and sleep position and avoiding excessive flexion of the spine looking down at screens computers etc.  In fact she says she is going to look at maybe a riser for her computer and see if that helps as well.  Wound on the left breast-recommend clean the area with just her  fingertips with Hibiclens and rinse well in the shower.  Do not scrub at the area.  We will treat with Xeroform, nonstick gauze and then she will either wear is more fitting sports bra to hold in place or use paper tape I did give her some paper tape today as she was starting to get some irritation from Band-Aid that she had been wearing.  Like to see her back in 1 week.  Right now I did not see any sign of infection and it looks pretty good.  I wonder if she  may have had epinephrine with the anesthesia and if that may have caused some skin necrosis.  Just not sure.  She also has a smaller scab on the right breast in a similar area but it is much smaller measuring maybe half a centimeter.  On that when I just want her to moisturize it twice a day with Aquaphor.  No orders of the defined types were placed in this encounter.    Beatrice Lecher, MD

## 2019-08-27 ENCOUNTER — Other Ambulatory Visit: Payer: Self-pay | Admitting: Sports Medicine

## 2019-08-27 DIAGNOSIS — M7062 Trochanteric bursitis, left hip: Secondary | ICD-10-CM

## 2019-08-28 ENCOUNTER — Other Ambulatory Visit: Payer: Self-pay

## 2019-08-28 ENCOUNTER — Encounter: Payer: Self-pay | Admitting: Family Medicine

## 2019-08-28 ENCOUNTER — Ambulatory Visit (INDEPENDENT_AMBULATORY_CARE_PROVIDER_SITE_OTHER): Payer: BC Managed Care – PPO | Admitting: Family Medicine

## 2019-08-28 VITALS — BP 111/69 | HR 96 | Ht 64.0 in | Wt 147.0 lb

## 2019-08-28 DIAGNOSIS — S21002A Unspecified open wound of left breast, initial encounter: Secondary | ICD-10-CM

## 2019-08-28 DIAGNOSIS — J0100 Acute maxillary sinusitis, unspecified: Secondary | ICD-10-CM | POA: Diagnosis not present

## 2019-08-28 MED ORDER — AMOXICILLIN 875 MG PO TABS: 875.0000 mg | ORAL_TABLET | Freq: Two times a day (BID) | ORAL | 0 refills | Status: DC

## 2019-08-28 NOTE — Progress Notes (Signed)
Established Patient Office Visit  Subjective:  Patient ID: Tara Howard, female    DOB: 07-17-1971  Age: 49 y.o. MRN: [id number]  CC:  Chief Complaint  Patient presents with  . Wound Check  . Sinusitis    sxs began gradually. headache, tooth/jaw pain    HPI Tara Howard presents for follow-up wound on her left breast.  She has been applying Xeroform and a nonstick gauze daily after just cleansing in the shower.  She says it does not look worse but it does not seem to really be getting a whole lot smaller.  Some of the more yellow tissue look like it was starting to try to peel off this morning.  She says it still draining a lot.  Sometimes bloody sometimes just serous fluid.  No odor.  No erythema or streaking.  No fever chills or sweats.  She has had some sinus drainage and congestion for about 6 days.  She says is particularly causing a lot of pressure around her left eye and radiating down into the left teeth upper jaw area.  She is had some pressure and pain in that left ear.  Some mild sore throat just in the mornings.  No fevers, chills, or sweats.  No discoloration to the nasal drainage.  Past Medical History:  Diagnosis Date  . 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 Anxiety   . Asthma   . Breast cancer ([place])   . Chronic headache   . Depression   . Fibromyalgia   . Fibromyalgia 2006   pain / headaches  . GERD (gastroesophageal reflux disease)   . IBS (irritable bowel syndrome)   . Osteoarthritis    in her hips    Past Surgical History:  Procedure Laterality Date  . bilateral mastectomy  11/01/2018  . BRAVO [place] STUDY N/A 03/26/2019   Procedure: BRAVO [place];  Surgeon: Lavena Bullion, DO;  Location: [facility];  Service: Gastroenterology;  Laterality: N/A;  . BRAVO [place] STUDY N/A 06/27/2019   Procedure: BRAVO [place];  Surgeon: Lavena Bullion, DO;  Location: [facility];  Service: Gastroenterology;  Laterality: N/A;  . BREAST LUMPECTOMY Left 09/2018  .  BREAST RECONSTRUCTION  12/2018  . CESAREAN SECTION     X 2  . ENDOMETRIAL ABLATION    . ESOPHAGOGASTRODUODENOSCOPY     said it was done maybe about 10 years ago. In [place]  . ESOPHAGOGASTRODUODENOSCOPY (EGD) WITH PROPOFOL N/A 03/26/2019   Procedure: ESOPHAGOGASTRODUODENOSCOPY (EGD) WITH PROPOFOL;  Surgeon: Lavena Bullion, DO;  Location: [facility];  Service: Gastroenterology;  Laterality: N/A;  . ESOPHAGOGASTRODUODENOSCOPY (EGD) WITH PROPOFOL N/A 06/27/2019   Procedure: ESOPHAGOGASTRODUODENOSCOPY (EGD) WITH PROPOFOL;  Surgeon: Lavena Bullion, DO;  Location: [facility];  Service: Gastroenterology;  Laterality: N/A;  . TONSILLECTOMY    . TONSILLECTOMY    . TUBAL LIGATION    . UPPER GASTROINTESTINAL ENDOSCOPY      Family History  Problem Relation Age of Onset  . CAD Father        3 vessel bypass  . Depression Mother   . Hypertension Mother   . Heart attack Mother   . CAD Mother        3 vessel bypass  . Irritable bowel syndrome Mother   . Aneurysm Mother        brain  . Cancer Paternal Grandmother        of the bowel per patient   . Esophageal cancer Paternal Grandmother  Social History   Socioeconomic History  . Marital status: Legally Separated    Spouse name: Not on file  . Number of children: 2  . Years of education: Not on file  . Highest education level: Not on file  Occupational History  . Occupation: Wellsite geologist: [employer]    Comment: [employer]  Tobacco Use  . Smoking status: Never Smoker  . Smokeless tobacco: Never Used  Substance and Sexual Activity  . Alcohol use: Yes    Comment: rarely   . Drug use: Never  . Sexual activity: Yes  Other Topics Concern  . Not on file  Social History Narrative  . Not on file   Social Determinants of Health   Financial Resource Strain:   . Difficulty of Paying Living Expenses: Not on file  Food Insecurity:   . Worried About Charity fundraiser in the Last  Year: Not on file  . Ran Out of Food in the Last Year: Not on file  Transportation Needs:   . Lack of Transportation (Medical): Not on file  . Lack of Transportation (Non-Medical): Not on file  Physical Activity:   . Days of Exercise per Week: Not on file  . Minutes of Exercise per Session: Not on file  Stress:   . Feeling of Stress : Not on file  Social Connections:   . Frequency of Communication with Friends and Family: Not on file  . Frequency of Social Gatherings with Friends and Family: Not on file  . Attends Religious Services: Not on file  . Active Member of Clubs or Organizations: Not on file  . Attends Archivist Meetings: Not on file  . Marital Status: Not on file  Intimate Partner Violence:   . Fear of Current or Ex-Partner: Not on file  . Emotionally Abused: Not on file  . Physically Abused: Not on file  . Sexually Abused: Not on file    Outpatient Medications Prior to Visit  Medication Sig Dispense Refill  . acetaminophen (TYLENOL) 500 MG tablet Take 1,500 mg by mouth 2 (two) times daily as needed for moderate pain or headache.    . B Complex-C (B-COMPLEX WITH VITAMIN C) tablet Take 1 tablet by mouth daily.    . celecoxib (CELEBREX) 100 MG capsule One to 2 tablets by mouth daily as needed for pain. 60 capsule 3  . Cholecalciferol (DIALYVITE VITAMIN D 5000) 125 MCG (5000 UT) capsule Take 5,000 Units by mouth daily.    Marland Kitchen dexlansoprazole (DEXILANT) 60 MG capsule Take 60 mg by mouth daily.    . diazepam (VALIUM) 5 MG tablet Take 5 mg by mouth as needed for anxiety.    . fluticasone (FLONASE) 50 MCG/ACT nasal spray Place 1 spray into both nostrils daily.    Marland Kitchen Hyaluronic Acid-Vitamin C (HYALURONIC ACID PO) Take 2 tablets by mouth daily.     Marland Kitchen HYDROcodone-acetaminophen (NORCO/VICODIN) 5-325 MG tablet Take 1 tablet by mouth every 8 (eight) hours as needed for moderate pain. 15 tablet 0  . levalbuterol (XOPENEX HFA) 45 MCG/ACT inhaler Inhale 2 puffs into the lungs  every 6 (six) hours as needed for wheezing or shortness of breath. Pt will call when needs to filled. 15 g 1  . Multiple Vitamins-Minerals (HAIR SKIN AND NAILS FORMULA PO) Take 2 tablets by mouth daily.    . Multiple Vitamins-Minerals (MULTIVITAMIN WITH MINERALS) tablet Take 1 tablet by mouth daily.    Vladimir Faster Glycol-Propyl Glycol (SYSTANE  OP) Place 1 drop into both eyes daily.    . polyethylene glycol (MIRALAX / GLYCOLAX) 17 g packet Take 17 g by mouth 2 (two) times daily.     . pregabalin (LYRICA) 200 MG capsule TAKE ONE CAPSULE BY MOUTH TWICE DAILY (Patient taking differently: Take 200 mg by mouth daily. ) 180 capsule 0  . QVAR REDIHALER 80 MCG/ACT inhaler Inhale 2 puffs into the lungs 2 (two) times daily.     . tamoxifen (NOLVADEX) 20 MG tablet Take 20 mg by mouth at bedtime.    . traMADol (ULTRAM) 50 MG tablet PLEASE SEE ATTACHED FOR DETAILED DIRECTIONS 21 tablet 0  . traZODone (DESYREL) 100 MG tablet TAKE 1 & 1/2 TABLETS BY MOUTH AT BEDTIME (Patient taking differently: Take 150 mg by mouth at bedtime. TAKE 1 & 1/2 TABLETS BY MOUTH AT BEDTIME) 135 tablet 3  . venlafaxine (EFFEXOR) 75 MG tablet Take 1 tablet (75 mg total) by mouth daily. 90 tablet 0   No facility-administered medications prior to visit.    Allergies  Allergen Reactions  . Azithromycin Diarrhea, Nausea And Vomiting and Other (See Comments)       . Augmentin [Amoxicillin-Pot Clavulanate] Diarrhea, Nausea And Vomiting and Other (See Comments)    Stomach cramps Did it involve swelling of the face/tongue/throat, SOB, or low BP? No Did it involve sudden or severe rash/hives, skin peeling, or any reaction on the inside of your mouth or nose? No Did you need to seek medical attention at a hospital or doctor's office? No When did it last happen?More than 3 years ago If all above answers are "NO", may proceed with cephalosporin use.   . Clonidine Derivatives Rash  . Flexeril [Cyclobenzaprine] Other (See Comments)     constipation  . Linzess [Linaclotide] Other (See Comments)    cramping  . Oxybutynin Rash  . Zolpidem Tartrate Other (See Comments)    Hallucinations    ROS Review of Systems    Objective:    Physical Exam  Constitutional: She is oriented to person, place, and time. She appears well-developed and well-nourished.  HENT:  Head: Normocephalic and atraumatic.  Right Ear: External ear normal.  Left Ear: External ear normal.  Nose: Nose normal.  Mouth/Throat: Oropharynx is clear and moist.  TMs and canals are clear.   Eyes: Pupils are equal, round, and reactive to light. Conjunctivae and EOM are normal.  Neck: No thyromegaly present.  Cardiovascular: Normal rate, regular rhythm and normal heart sounds.  Pulmonary/Chest: Effort normal and breath sounds normal. She has no wheezes.  Musculoskeletal:     Cervical back: Neck supple.  Lymphadenopathy:    She has no cervical adenopathy.  Neurological: She is alert and oriented to person, place, and time.  Skin: Skin is warm and dry.  There is still an oval-shaped wound on the medial side of her left breast.  The outer ring has a yellowish discoloration but it does look more dry and there is still a black area in the center.  No active drainage right now.  No spreading erythema.  Nontender on exam.  Psychiatric: She has a normal mood and affect.    BP 111/69   Pulse 96   Ht 5\' 4"  (1.626 m)   Wt 147 lb (66.7 kg)   SpO2 99%   BMI 25.23 kg/m  Wt Readings from Last 3 Encounters:  08/28/19 147 lb (66.7 kg)  08/19/19 148 lb (67.1 kg)  08/12/19 138 lb (62.6 kg)  There are no preventive care reminders to display for this patient.  There are no preventive care reminders to display for this patient.  Lab Results  Component Value Date   TSH 1.235 10/09/2014   Lab Results  Component Value Date   WBC 5.3 10/09/2014   HGB 13.8 10/09/2014   HCT 41.7 10/09/2014   MCV 94.1 10/09/2014   PLT 249 10/09/2014   Lab Results   Component Value Date   NA 140 08/08/2016   NA 140 08/08/2016   K 4.3 08/08/2016   K 4.3 08/08/2016   CO2 27 10/09/2014   GLUCOSE 84 10/09/2014   BUN 12 08/08/2016   BUN 18 08/08/2016   CREATININE 0.7 08/08/2016   CREATININE 0.7 08/08/2016   BILITOT 0.5 10/09/2014   ALKPHOS 51 08/08/2016   AST 15 08/08/2016   AST 15 08/08/2016   ALT 13 08/08/2016   ALT 13 08/08/2016   PROT 6.4 10/09/2014   ALBUMIN 4.3 10/09/2014   CALCIUM 9.2 10/09/2014   Lab Results  Component Value Date   CHOL 146 08/08/2016   CHOL 146 08/08/2016   Lab Results  Component Value Date   HDL 48 08/08/2016   HDL 48 08/08/2016   Lab Results  Component Value Date   LDLCALC 85 08/08/2016   LDLCALC 85 08/08/2016   Lab Results  Component Value Date   TRIG 65 08/08/2016   TRIG 65 08/08/2016   No results found for: CHOLHDL No results found for: HGBA1C    Assessment & Plan:   Problem List Items Addressed This Visit    None    Visit Diagnoses    Acute non-recurrent maxillary sinusitis    -  Primary   Relevant Medications   amoxicillin (AMOXIL) 875 MG tablet   Wound, breast, left, initial encounter          Left breast wound -overall it still looks good it just does not look smaller.  Discussed options.  We will change up wound care course.  Will apply DuoDERM since it sounds like the wound has a lot of exudate.  Change every 2 to 3 days.  Follow-up in 10 days.  Call if any concerns or problems.  Acute sinusitis-we will treat with amoxicillin.  Call if not significantly better after the weekend.  Okay to do symptomatic care as well.   Meds ordered this encounter  Medications  . amoxicillin (AMOXIL) 875 MG tablet    Sig: Take 1 tablet (875 mg total) by mouth 2 (two) times daily.    Dispense:  20 tablet    Refill:  0    Follow-up: Return in about 10 days (around 09/07/2019) for recheck wound.    Beatrice Lecher, MD

## 2019-08-28 NOTE — Progress Notes (Signed)
Pt reports that L breast is still oozing/bleeding. She hasn't really noticed much of a difference in the wound.

## 2019-08-28 NOTE — Patient Instructions (Signed)
Change the Duoderm dressing every 2-3 days.  Will need to cover while in the shower.

## 2019-09-02 ENCOUNTER — Encounter: Payer: Self-pay | Admitting: Family Medicine

## 2019-09-03 MED ORDER — CEFDINIR 300 MG PO CAPS: 300.0000 mg | ORAL_CAPSULE | Freq: Two times a day (BID) | ORAL | 0 refills | Status: DC

## 2019-09-04 ENCOUNTER — Ambulatory Visit: Payer: BC Managed Care – PPO | Admitting: Sports Medicine

## 2019-09-11 ENCOUNTER — Ambulatory Visit (INDEPENDENT_AMBULATORY_CARE_PROVIDER_SITE_OTHER): Payer: BC Managed Care – PPO | Admitting: Family Medicine

## 2019-09-11 ENCOUNTER — Ambulatory Visit (INDEPENDENT_AMBULATORY_CARE_PROVIDER_SITE_OTHER): Payer: BC Managed Care – PPO | Admitting: Sports Medicine

## 2019-09-11 ENCOUNTER — Encounter: Payer: Self-pay | Admitting: Family Medicine

## 2019-09-11 ENCOUNTER — Encounter: Payer: Self-pay | Admitting: Sports Medicine

## 2019-09-11 ENCOUNTER — Other Ambulatory Visit: Payer: Self-pay

## 2019-09-11 VITALS — BP 112/70 | HR 102 | Ht 64.0 in | Wt 147.0 lb

## 2019-09-11 DIAGNOSIS — J329 Chronic sinusitis, unspecified: Secondary | ICD-10-CM | POA: Diagnosis not present

## 2019-09-11 DIAGNOSIS — S21002A Unspecified open wound of left breast, initial encounter: Secondary | ICD-10-CM

## 2019-09-11 DIAGNOSIS — M7062 Trochanteric bursitis, left hip: Secondary | ICD-10-CM | POA: Diagnosis not present

## 2019-09-11 DIAGNOSIS — M1612 Unilateral primary osteoarthritis, left hip: Secondary | ICD-10-CM

## 2019-09-11 MED ORDER — CEFDINIR 300 MG PO CAPS: 300.0000 mg | ORAL_CAPSULE | Freq: Two times a day (BID) | ORAL | 0 refills | Status: DC

## 2019-09-11 MED ORDER — TRAMADOL HCL 50 MG PO TABS: 100.0000 mg | ORAL_TABLET | Freq: Three times a day (TID) | ORAL | 0 refills | Status: DC | PRN

## 2019-09-11 MED ORDER — PREDNISONE 10 MG PO TABS: ORAL_TABLET | ORAL | 0 refills | Status: DC

## 2019-09-11 NOTE — Progress Notes (Signed)
    Procedures performed today:    None.  Independent interpretation of tests performed by another provider:   None.  Impression and Recommendations:    Primary osteoarthritis of left hip This pleasant 49 year old female returns, she has left hip pain, chronic. Mild osteoarthritis on x-rays, but for the most part rapid progression of her pain. She has had therapy, she has had hip joint injections, nothing is working. She needs a hip arthroplasty but right now cannot afford the procedure. She has had several doses of steroids both in her knees, as well as systemically and her hip, so AVN is certainly a possibility, and I do see a mild splotchiness in her hip x-rays as well as mild lucency in the subchondral bone that could be consistent with AVN but MRI right now is too expensive as well. She does understand the treatment for AVN and hip arthritis that has failed conservative measures is still arthroplasty. Increasing tramadol to 100 mg 3 times daily, she will stay ahead of the constipation with various medications, and I would like a surgical opinion from Dr. Berenice Primas.    ___________________________________________ Gwen Her. Dianah Field, M.D., ABFM., CAQSM. Primary Care and [place] Instructor of [place] [employer] [facility]

## 2019-09-11 NOTE — Assessment & Plan Note (Signed)
This pleasant 49 year old female returns, she has left hip pain, chronic. Mild osteoarthritis on x-rays, but for the most part rapid progression of her pain. She has had therapy, she has had hip joint injections, nothing is working. She needs a hip arthroplasty but right now cannot afford the procedure. She has had several doses of steroids both in her knees, as well as systemically and her hip, so AVN is certainly a possibility, and I do see a mild splotchiness in her hip x-rays as well as mild lucency in the subchondral bone that could be consistent with AVN but MRI right now is too expensive as well. She does understand the treatment for AVN and hip arthritis that has failed conservative measures is still arthroplasty. Increasing tramadol to 100 mg 3 times daily, she will stay ahead of the constipation with various medications, and I would like a surgical opinion from Dr. Berenice Primas.

## 2019-09-11 NOTE — Progress Notes (Signed)
Established Patient Office Visit  Subjective:  Patient ID: Tara Howard, female    DOB: June 18, 1971  Age: 49 y.o. MRN: [id number]  CC:  Chief Complaint  Patient presents with  . Wound Check    area has gotten smaller but she stated that its "goopy"    HPI Tara Howard presents for   Follow-up left breast wound-he says overall she is doing better she actually thinks it smaller and looks a little better.  She some some of the very dark-colored scab in the center finally came off when she was drying off after her shower.  She is been able to keep the DuoDERM on for about 3 days at a time before having to change the bandage.  She denies any fevers chills or sweats or odor.  But she is interested in doing a culture of the wound.  In regard to her sinusitis she was initially treated with amoxicillin and then we switched to [facility].  She does feel like it is more effective but she still has a lot of drainage and congestion and a lot of facial pressure that has not resolved.  She said the yellow discoloration has become more clear which is good and again she feels like it did work better than the first antibiotic which was amoxicillin.  Past Medical History:  Diagnosis Date  . 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 Anxiety   . Asthma   . Breast cancer ([place])   . Chronic headache   . Depression   . Fibromyalgia   . Fibromyalgia 2006   pain / headaches  . GERD (gastroesophageal reflux disease)   . IBS (irritable bowel syndrome)   . Osteoarthritis    in her hips    Past Surgical History:  Procedure Laterality Date  . bilateral mastectomy  11/01/2018  . BRAVO [place] STUDY N/A 03/26/2019   Procedure: BRAVO [place];  Surgeon: Lavena Bullion, DO;  Location: [facility];  Service: Gastroenterology;  Laterality: N/A;  . BRAVO [place] STUDY N/A 06/27/2019   Procedure: BRAVO [place];  Surgeon: Lavena Bullion, DO;  Location: [facility];  Service: Gastroenterology;  Laterality: N/A;  . BREAST  LUMPECTOMY Left 09/2018  . BREAST RECONSTRUCTION  12/2018  . CESAREAN SECTION     X 2  . ENDOMETRIAL ABLATION    . ESOPHAGOGASTRODUODENOSCOPY     said it was done maybe about 10 years ago. In [place]  . ESOPHAGOGASTRODUODENOSCOPY (EGD) WITH PROPOFOL N/A 03/26/2019   Procedure: ESOPHAGOGASTRODUODENOSCOPY (EGD) WITH PROPOFOL;  Surgeon: Lavena Bullion, DO;  Location: [facility];  Service: Gastroenterology;  Laterality: N/A;  . ESOPHAGOGASTRODUODENOSCOPY (EGD) WITH PROPOFOL N/A 06/27/2019   Procedure: ESOPHAGOGASTRODUODENOSCOPY (EGD) WITH PROPOFOL;  Surgeon: Lavena Bullion, DO;  Location: [facility];  Service: Gastroenterology;  Laterality: N/A;  . TONSILLECTOMY    . TONSILLECTOMY    . TUBAL LIGATION    . UPPER GASTROINTESTINAL ENDOSCOPY      Family History  Problem Relation Age of Onset  . CAD Father        3 vessel bypass  . Depression Mother   . Hypertension Mother   . Heart attack Mother   . CAD Mother        3 vessel bypass  . Irritable bowel syndrome Mother   . Aneurysm Mother        brain  . Cancer Paternal Grandmother        of the bowel per patient   . Esophageal  cancer Paternal Grandmother     Social History   Socioeconomic History  . Marital status: Legally Separated    Spouse name: Not on file  . Number of children: 2  . Years of education: Not on file  . Highest education level: Not on file  Occupational History  . Occupation: Wellsite geologist: [employer]    Comment: [employer]  Tobacco Use  . Smoking status: Never Smoker  . Smokeless tobacco: Never Used  Substance and Sexual Activity  . Alcohol use: Yes    Comment: rarely   . Drug use: Never  . Sexual activity: Yes  Other Topics Concern  . Not on file  Social History Narrative  . Not on file   Social Determinants of Health   Financial Resource Strain:   . Difficulty of Paying Living Expenses: Not on file  Food Insecurity:   . Worried About Paediatric nurse in the Last Year: Not on file  . Ran Out of Food in the Last Year: Not on file  Transportation Needs:   . Lack of Transportation (Medical): Not on file  . Lack of Transportation (Non-Medical): Not on file  Physical Activity:   . Days of Exercise per Week: Not on file  . Minutes of Exercise per Session: Not on file  Stress:   . Feeling of Stress : Not on file  Social Connections:   . Frequency of Communication with Friends and Family: Not on file  . Frequency of Social Gatherings with Friends and Family: Not on file  . Attends Religious Services: Not on file  . Active Member of Clubs or Organizations: Not on file  . Attends Archivist Meetings: Not on file  . Marital Status: Not on file  Intimate Partner Violence:   . Fear of Current or Ex-Partner: Not on file  . Emotionally Abused: Not on file  . Physically Abused: Not on file  . Sexually Abused: Not on file    Outpatient Medications Prior to Visit  Medication Sig Dispense Refill  . acetaminophen (TYLENOL) 500 MG tablet Take 1,500 mg by mouth 2 (two) times daily as needed for moderate pain or headache.    . B Complex-C (B-COMPLEX WITH VITAMIN C) tablet Take 1 tablet by mouth daily.    . celecoxib (CELEBREX) 100 MG capsule One to 2 tablets by mouth daily as needed for pain. 60 capsule 3  . Cholecalciferol (DIALYVITE VITAMIN D 5000) 125 MCG (5000 UT) capsule Take 5,000 Units by mouth daily.    Marland Kitchen dexlansoprazole (DEXILANT) 60 MG capsule Take 60 mg by mouth daily.    . diazepam (VALIUM) 5 MG tablet Take 5 mg by mouth as needed for anxiety.    . fluticasone (FLONASE) 50 MCG/ACT nasal spray Place 1 spray into both nostrils daily.    Marland Kitchen Hyaluronic Acid-Vitamin C (HYALURONIC ACID PO) Take 2 tablets by mouth daily.     Marland Kitchen levalbuterol (XOPENEX HFA) 45 MCG/ACT inhaler Inhale 2 puffs into the lungs every 6 (six) hours as needed for wheezing or shortness of breath. Pt will call when needs to filled. 15 g 1  . Multiple  Vitamins-Minerals (HAIR SKIN AND NAILS FORMULA PO) Take 2 tablets by mouth daily.    . Multiple Vitamins-Minerals (MULTIVITAMIN WITH MINERALS) tablet Take 1 tablet by mouth daily.    Vladimir Faster Glycol-Propyl Glycol (SYSTANE OP) Place 1 drop into both eyes daily.    . polyethylene glycol (MIRALAX / GLYCOLAX)  17 g packet Take 17 g by mouth 2 (two) times daily.     . pregabalin (LYRICA) 200 MG capsule TAKE ONE CAPSULE BY MOUTH TWICE DAILY (Patient taking differently: Take 200 mg by mouth daily. ) 180 capsule 0  . QVAR REDIHALER 80 MCG/ACT inhaler Inhale 2 puffs into the lungs 2 (two) times daily.     . tamoxifen (NOLVADEX) 20 MG tablet Take 20 mg by mouth at bedtime.    . traMADol (ULTRAM) 50 MG tablet PLEASE SEE ATTACHED FOR DETAILED DIRECTIONS 21 tablet 0  . traZODone (DESYREL) 100 MG tablet TAKE 1 & 1/2 TABLETS BY MOUTH AT BEDTIME (Patient taking differently: Take 150 mg by mouth at bedtime. TAKE 1 & 1/2 TABLETS BY MOUTH AT BEDTIME) 135 tablet 3  . venlafaxine (EFFEXOR) 75 MG tablet Take 1 tablet (75 mg total) by mouth daily. 90 tablet 0  . amoxicillin (AMOXIL) 875 MG tablet Take 1 tablet (875 mg total) by mouth 2 (two) times daily. 20 tablet 0  . cefdinir (OMNICEF) 300 MG capsule Take 1 capsule (300 mg total) by mouth 2 (two) times daily. 14 capsule 0  . HYDROcodone-acetaminophen (NORCO/VICODIN) 5-325 MG tablet Take 1 tablet by mouth every 8 (eight) hours as needed for moderate pain. 15 tablet 0   No facility-administered medications prior to visit.    Allergies  Allergen Reactions  . Azithromycin Diarrhea, Nausea And Vomiting and Other (See Comments)       . Augmentin [Amoxicillin-Pot Clavulanate] Diarrhea, Nausea And Vomiting and Other (See Comments)    Stomach cramps Did it involve swelling of the face/tongue/throat, SOB, or low BP? No Did it involve sudden or severe rash/hives, skin peeling, or any reaction on the inside of your mouth or nose? No Did you need to seek medical attention  at a hospital or doctor's office? No When did it last happen?More than 3 years ago If all above answers are "NO", may proceed with cephalosporin use.   . Clonidine Derivatives Rash  . Flexeril [Cyclobenzaprine] Other (See Comments)    constipation  . Linzess [Linaclotide] Other (See Comments)    cramping  . Oxybutynin Rash  . Zolpidem Tartrate Other (See Comments)    Hallucinations    ROS Review of Systems    Objective:    Physical Exam  Constitutional: She is oriented to person, place, and time. She appears well-developed and well-nourished.  HENT:  Head: Normocephalic and atraumatic.  Eyes: Conjunctivae and EOM are normal.  Cardiovascular: Normal rate.  Pulmonary/Chest: Effort normal.  Neurological: She is alert and oriented to person, place, and time.  Skin: Skin is dry. No pallor.  Wound on left medial breast measuring 10 x 7 mm in size.  A little bit of clear serous drainage with yellow tissue in the center.  Psychiatric: She has a normal mood and affect. Her behavior is normal.  Vitals reviewed.   BP 112/70   Pulse (!) 102   Ht 5\' 4"  (1.626 m)   Wt 147 lb (66.7 kg)   SpO2 100%   BMI 25.23 kg/m  Wt Readings from Last 3 Encounters:  09/11/19 147 lb (66.7 kg)  08/28/19 147 lb (66.7 kg)  08/19/19 148 lb (67.1 kg)     There are no preventive care reminders to display for this patient.  There are no preventive care reminders to display for this patient.  Lab Results  Component Value Date   TSH 1.235 10/09/2014   Lab Results  Component Value Date  WBC 5.3 10/09/2014   HGB 13.8 10/09/2014   HCT 41.7 10/09/2014   MCV 94.1 10/09/2014   PLT 249 10/09/2014   Lab Results  Component Value Date   NA 140 08/08/2016   NA 140 08/08/2016   K 4.3 08/08/2016   K 4.3 08/08/2016   CO2 27 10/09/2014   GLUCOSE 84 10/09/2014   BUN 12 08/08/2016   BUN 18 08/08/2016   CREATININE 0.7 08/08/2016   CREATININE 0.7 08/08/2016   BILITOT 0.5 10/09/2014   ALKPHOS  51 08/08/2016   AST 15 08/08/2016   AST 15 08/08/2016   ALT 13 08/08/2016   ALT 13 08/08/2016   PROT 6.4 10/09/2014   ALBUMIN 4.3 10/09/2014   CALCIUM 9.2 10/09/2014   Lab Results  Component Value Date   CHOL 146 08/08/2016   CHOL 146 08/08/2016   Lab Results  Component Value Date   HDL 48 08/08/2016   HDL 48 08/08/2016   Lab Results  Component Value Date   LDLCALC 85 08/08/2016   [place] 85 08/08/2016   Lab Results  Component Value Date   TRIG 65 08/08/2016   TRIG 65 08/08/2016   No results found for: CHOLHDL No results found for: HGBA1C    Assessment & Plan:   Problem List Items Addressed This Visit    None    Visit Diagnoses    Wound, breast, left, initial encounter    -  Primary   Relevant Orders   Wound culture   Chronic sinusitis, unspecified location       Relevant Medications   predniSONE (DELTASONE) 10 MG tablet   cefdinir (OMNICEF) 300 MG capsule      Chronic sinusitis-at this point she is significantly better but symptoms are still not resolved so recommend continue the [facility] for an additional 5 days.  She had just finished antibiotics yesterday and will add a prednisone taper to try to relieve sinus congestion and pressure.  Breast wound-culture obtained.  Replaced the DuoDERM and she can place the Tegaderm on top when showering.  Continue to change the DuoDERM every 3 days until the wound becomes more dry.  When that occurs then please just let us know and we will probably switch back to just Xeroform with nonstick gauze.  Meds ordered this encounter  Medications  . predniSONE (DELTASONE) 10 MG tablet    Sig: 8 tabs po Day 1, 6 tabs Day 2, 4 tabs Day 3, 2 Tabs Day 4, 1 tab Day 5    Dispense:  21 tablet    Refill:  0  . cefdinir (OMNICEF) 300 MG capsule    Sig: Take 1 capsule (300 mg total) by mouth 2 (two) times daily.    Dispense:  10 capsule    Refill:  0    Follow-up: No follow-ups on file.    Beatrice Lecher, MD

## 2019-09-14 LAB — WOUND CULTURE
MICRO NUMBER:: 10160107
RESULT:: NO GROWTH
SPECIMEN QUALITY:: ADEQUATE

## 2019-09-23 ENCOUNTER — Telehealth: Payer: Self-pay

## 2019-09-23 NOTE — Telephone Encounter (Signed)
Tara Howard would like to know if she should continue with the DuoDerm or switch back to Xeroform. She states it isn't dry but wanted to know if the DuoDerm would let it dry out. Please advise.   Plan of care -  Replaced the DuoDERM and she can place the Tegaderm on top when showering.  Continue to change the DuoDERM every 3 days until the wound becomes more dry.  When that occurs then please just let us know and we will probably switch back to just Xeroform with nonstick gauze.

## 2019-09-23 NOTE — Telephone Encounter (Signed)
Okay.  If the wound is no longer draining like it was then we can go ahead and switch to the Xeroform.  That is the yellow coated gauze.  Lets go ahead and switch back to the Xeroform with some nonstick gauze on top.  Okay to change the dressing daily.  Okay to get wet in the shower and just pat dry.

## 2019-09-23 NOTE — Telephone Encounter (Signed)
Called patient and instructed on MD plan of care.  Patient verbalized understanding and no questions or concerns at this time. KG LPN

## 2019-10-10 ENCOUNTER — Other Ambulatory Visit: Payer: Self-pay | Admitting: Family Medicine

## 2019-10-26 ENCOUNTER — Other Ambulatory Visit: Payer: Self-pay | Admitting: Family Medicine

## 2019-11-21 ENCOUNTER — Other Ambulatory Visit: Payer: Self-pay | Admitting: Family Medicine

## 2019-11-21 ENCOUNTER — Other Ambulatory Visit: Payer: Self-pay | Admitting: *Deleted

## 2019-12-02 ENCOUNTER — Other Ambulatory Visit (HOSPITAL_COMMUNITY)
Admission: RE | Admit: 2019-12-02 | Discharge: 2019-12-02 | Disposition: A | Discharge status: Home / Self Care | Payer: BC Managed Care – PPO | Source: Ambulatory Visit | Attending: Obstetrics & Gynecology | Admitting: Obstetrics & Gynecology

## 2019-12-02 ENCOUNTER — Other Ambulatory Visit: Payer: Self-pay

## 2019-12-02 ENCOUNTER — Encounter: Payer: Self-pay | Admitting: Obstetrics & Gynecology

## 2019-12-02 ENCOUNTER — Ambulatory Visit (INDEPENDENT_AMBULATORY_CARE_PROVIDER_SITE_OTHER): Payer: BC Managed Care – PPO | Admitting: Obstetrics & Gynecology

## 2019-12-02 VITALS — BP 118/75 | HR 83 | Ht 64.0 in | Wt 147.0 lb

## 2019-12-02 DIAGNOSIS — N898 Other specified noninflammatory disorders of vagina: Secondary | ICD-10-CM | POA: Diagnosis present

## 2019-12-02 DIAGNOSIS — B373 Candidiasis of vulva and vagina: Secondary | ICD-10-CM | POA: Diagnosis not present

## 2019-12-02 DIAGNOSIS — Z01419 Encounter for gynecological examination (general) (routine) without abnormal findings: Secondary | ICD-10-CM | POA: Diagnosis present

## 2019-12-02 DIAGNOSIS — N94819 Vulvodynia, unspecified: Secondary | ICD-10-CM

## 2019-12-02 MED ORDER — LIDOCAINE 5 % EX OINT
1.0000 | TOPICAL_OINTMENT | Freq: Four times a day (QID) | CUTANEOUS | 1 refills | Status: DC | PRN
Start: 2019-12-02 — End: 2020-01-29

## 2019-12-02 NOTE — Progress Notes (Signed)
Last pap- 08/5816- negative

## 2019-12-02 NOTE — Progress Notes (Signed)
Subjective:     Tara Howard is a 49 y.o. female here for a routine exam.  Current complaints: vulvar pain.  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  Underwent PT and vestibulectomy without improvement.  Pt does not have pain or burning unless provoked.     Gynecologic History No LMP recorded. Patient has had an ablation. Contraception: abstinence Last Pap: 2018. Results were: normal Last mammogram: Bilateral mastectomy in 10/2018; mammograms not indicated post mastectomy.  Obstetric History OB History  No obstetric history on file.  [id number]   The following portions of the patient's history were reviewed and updated as appropriate: allergies, current medications, past family history, past medical history, past social history, past surgical history and problem list.  Review of Systems Pertinent items noted in HPI and remainder of comprehensive ROS otherwise negative.    Objective:      Vitals:   12/02/19 1532  BP: 118/75  Pulse: 83  Weight: 147 lb (66.7 kg)  Height: 5\' 4"  (1.626 m)   Vitals:  WNL General appearance: alert, cooperative and no distress  HEENT: Normocephalic, without obvious abnormality, atraumatic Eyes: negative Throat: lips, mucosa, and tongue normal; teeth and gums normal  Respiratory: Clear to auscultation bilaterally  CV: Regular rate and rhythm  Breasts:  Normal appearance, no masses or tenderness, no nipple retraction or dimpling  GI: Soft, non-tender; bowel sounds normal; no masses,  no organomegaly  GU: External Genitalia:  Tanner V, no lesion Urethra:  No prolapse   Vagina: Introitus:  Pain at hymenal ring over vestibular and skenes glands. Pink, normal rugae, no blood or discharge  Cervix: No CMT, no lesion  Uterus:  Normal size and contour, non tender  Adnexa: Normal, no masses, non tender  Musculoskeletal: No edema, redness or tenderness in the calves or thighs  Skin: No lesions or rash  Lymphatic: Axillary adenopathy: none      Psychiatric: Normal mood and behavior        Assessment:    Healthy female exam.    Plan:    1.  Refer to PT Opal Sidles at [facility] in [facility] is convenient for her). 2.  [place] for complaint of discharge 3.  Lidocaine ointment for pain during intercourse prn

## 2019-12-03 ENCOUNTER — Telehealth: Payer: Self-pay | Admitting: *Deleted

## 2019-12-03 LAB — CERVICOVAGINAL ANCILLARY ONLY
Bacterial Vaginitis (gardnerella): NEGATIVE
Candida Glabrata: NEGATIVE
Candida Vaginitis: POSITIVE — AB
Comment: NEGATIVE
Comment: NEGATIVE
Comment: NEGATIVE

## 2019-12-03 MED ORDER — FLUCONAZOLE 150 MG PO TABS
ORAL_TABLET | ORAL | 0 refills | Status: DC
Start: 2019-12-03 — End: 2019-12-20

## 2019-12-03 NOTE — Telephone Encounter (Signed)
Pt notified of positive Candida vaginitis.  She prefers a tablet instead of cream.  RX for Diflucan sent to [facility].

## 2019-12-04 ENCOUNTER — Other Ambulatory Visit: Payer: Self-pay | Admitting: Obstetrics & Gynecology

## 2019-12-04 MED ORDER — FLUCONAZOLE 150 MG PO TABS
150.0000 mg | ORAL_TABLET | Freq: Once | ORAL | 1 refills | Status: AC
Start: 2019-12-04 — End: 2019-12-04

## 2019-12-05 LAB — CYTOLOGY - PAP
Comment: NEGATIVE
Diagnosis: NEGATIVE
High risk HPV: NEGATIVE

## 2019-12-06 ENCOUNTER — Other Ambulatory Visit: Payer: Self-pay

## 2019-12-06 MED ORDER — DEXILANT 60 MG PO CPDR: 60.0000 mg | DELAYED_RELEASE_CAPSULE | Freq: Every day | ORAL | 1 refills | Status: DC

## 2019-12-06 NOTE — Telephone Encounter (Signed)
Patient advised to schedule a follow up appointment.

## 2019-12-10 ENCOUNTER — Other Ambulatory Visit: Payer: Self-pay | Admitting: Family Medicine

## 2019-12-12 ENCOUNTER — Telehealth: Payer: Self-pay | Admitting: *Deleted

## 2019-12-12 ENCOUNTER — Encounter: Payer: Self-pay | Admitting: *Deleted
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
## 2019-12-17 ENCOUNTER — Ambulatory Visit: Payer: BC Managed Care – PPO | Admitting: Family Medicine

## 2019-12-20 ENCOUNTER — Encounter: Payer: Self-pay | Admitting: Family Medicine

## 2019-12-20 ENCOUNTER — Other Ambulatory Visit: Payer: Self-pay

## 2019-12-20 ENCOUNTER — Ambulatory Visit (INDEPENDENT_AMBULATORY_CARE_PROVIDER_SITE_OTHER): Payer: BC Managed Care – PPO | Admitting: Family Medicine

## 2019-12-20 VITALS — BP 122/75 | HR 87 | Ht 64.0 in | Wt 147.0 lb

## 2019-12-20 DIAGNOSIS — F419 Anxiety disorder, unspecified: Secondary | ICD-10-CM

## 2019-12-20 DIAGNOSIS — N951 Menopausal and female climacteric states: Secondary | ICD-10-CM | POA: Diagnosis not present

## 2019-12-20 DIAGNOSIS — F5101 Primary insomnia: Secondary | ICD-10-CM

## 2019-12-20 DIAGNOSIS — N898 Other specified noninflammatory disorders of vagina: Secondary | ICD-10-CM | POA: Diagnosis not present

## 2019-12-20 DIAGNOSIS — R232 Flushing: Secondary | ICD-10-CM

## 2019-12-20 DIAGNOSIS — F329 Major depressive disorder, single episode, unspecified: Secondary | ICD-10-CM

## 2019-12-20 DIAGNOSIS — T50905A Adverse effect of unspecified drugs, medicaments and biological substances, initial encounter: Secondary | ICD-10-CM

## 2019-12-20 MED ORDER — TRAZODONE HCL 100 MG PO TABS: 150.0000 mg | ORAL_TABLET | Freq: Every day | ORAL | 1 refills | Status: DC

## 2019-12-20 MED ORDER — GABAPENTIN 100 MG PO CAPS: 100.0000 mg | ORAL_CAPSULE | Freq: Every day | ORAL | 3 refills | Status: DC

## 2019-12-20 NOTE — Progress Notes (Signed)
Established Patient Office Visit  Subjective:  Patient ID: Tara Howard, female    DOB: 08/26/1970  Age: 49 y.o. MRN: [id number]  CC:  Chief Complaint  Patient presents with  . mood    HPI Tara Howard presents for follow-up for mood.  She says she is actually doing well she and her husband officially separated and October.  She says this is the best that she is actually felt in a while.  She is happy with her current regimen.  Her sleep is still fair not great quality she wakes frequently mostly from hot flashes.  She does feel like her hot flashes have been ramping up.  She currently takes tamoxifen and is on the venlafaxine.  Would like to discuss other options if possible.  Definitely worse at night but she has been getting a few more during the daytime as well  Reports a heavy vaginal discharge.  She said been going on for a while in fact she was wondering if it could be related to being in menopause.  She had actually mentioned it to her OB/GYN a few weeks ago and they had treated her for possible yeast infection.  She took the medication but says she still having a heavier discharge.  She says she is not having any discomfort or irritation.  Occasionally will notice an odor.  Past Medical History:  Diagnosis Date  . 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 Anxiety   . Asthma   . Breast cancer ([place])   . Chronic headache   . Depression   . Fibromyalgia   . Fibromyalgia 2006   pain / headaches  . GERD (gastroesophageal reflux disease)   . IBS (irritable bowel syndrome)   . Osteoarthritis    in her hips  . Vaginismus     Past Surgical History:  Procedure Laterality Date  . bilateral mastectomy  11/01/2018  . BRAVO [place] STUDY N/A 03/26/2019   Procedure: BRAVO [place];  Surgeon: Lavena Bullion, DO;  Location: [facility];  Service: Gastroenterology;  Laterality: N/A;  . BRAVO [place] STUDY N/A 06/27/2019   Procedure: BRAVO [employer];  Surgeon: Lavena Bullion, DO;  Location:  [facility];  Service: Gastroenterology;  Laterality: N/A;  . BREAST LUMPECTOMY Left 09/2018  . BREAST RECONSTRUCTION  12/2018  . CESAREAN SECTION     X 2  . ENDOMETRIAL ABLATION    . ESOPHAGOGASTRODUODENOSCOPY     said it was done maybe about 10 years ago. In [place]  . ESOPHAGOGASTRODUODENOSCOPY (EGD) WITH PROPOFOL N/A 03/26/2019   Procedure: ESOPHAGOGASTRODUODENOSCOPY (EGD) WITH PROPOFOL;  Surgeon: Lavena Bullion, DO;  Location: [facility];  Service: Gastroenterology;  Laterality: N/A;  . ESOPHAGOGASTRODUODENOSCOPY (EGD) WITH PROPOFOL N/A 06/27/2019   Procedure: ESOPHAGOGASTRODUODENOSCOPY (EGD) WITH PROPOFOL;  Surgeon: Lavena Bullion, DO;  Location: [facility];  Service: Gastroenterology;  Laterality: N/A;  . TONSILLECTOMY    . TONSILLECTOMY    . TUBAL LIGATION    . UPPER GASTROINTESTINAL ENDOSCOPY      Family History  Problem Relation Age of Onset  . CAD Father        3 vessel bypass  . Depression Mother   . Hypertension Mother   . Heart attack Mother   . CAD Mother        3 vessel bypass  . Irritable bowel syndrome Mother   . Aneurysm Mother        brain  . Cancer Paternal Grandmother  of the bowel per patient   . Esophageal cancer Paternal Grandmother     Social History   Socioeconomic History  . Marital status: Legally Separated    Spouse name: Not on file  . Number of children: 2  . Years of education: Not on file  . Highest education level: Not on file  Occupational History  . Occupation: Wellsite geologist: [employer]    Comment: [employer]  Tobacco Use  . Smoking status: Never Smoker  . Smokeless tobacco: Never Used  Substance and Sexual Activity  . Alcohol use: Yes    Comment: rarely   . Drug use: Never  . Sexual activity: Not Currently    Birth control/protection: Surgical  Other Topics Concern  . Not on file  Social History Narrative  . Not on file   Social Determinants of Health    Financial Resource Strain:   . Difficulty of Paying Living Expenses:   Food Insecurity:   . Worried About Charity fundraiser in the Last Year:   . Arboriculturist in the Last Year:   Transportation Needs:   . Film/video editor (Medical):   Marland Kitchen Lack of Transportation (Non-Medical):   Physical Activity:   . Days of Exercise per Week:   . Minutes of Exercise per Session:   Stress:   . Feeling of Stress :   Social Connections:   . Frequency of Communication with Friends and Family:   . Frequency of Social Gatherings with Friends and Family:   . Attends Religious Services:   . Active Member of Clubs or Organizations:   . Attends Archivist Meetings:   Marland Kitchen Marital Status:   Intimate Partner Violence:   . Fear of Current or Ex-Partner:   . Emotionally Abused:   Marland Kitchen Physically Abused:   . Sexually Abused:     Outpatient Medications Prior to Visit  Medication Sig Dispense Refill  . acetaminophen (TYLENOL) 500 MG tablet Take 1,500 mg by mouth 2 (two) times daily as needed for moderate pain or headache.    . B Complex-C (B-COMPLEX WITH VITAMIN C) tablet Take 1 tablet by mouth daily.    . Cholecalciferol (DIALYVITE VITAMIN D 5000) 125 MCG (5000 UT) capsule Take 5,000 Units by mouth daily.    Marland Kitchen dexlansoprazole (DEXILANT) 60 MG capsule Take 1 capsule (60 mg total) by mouth daily. 30 capsule 1  . fluticasone (FLONASE) 50 MCG/ACT nasal spray Place 1 spray into both nostrils daily.    Marland Kitchen Hyaluronic Acid-Vitamin C (HYALURONIC ACID PO) Take 2 tablets by mouth daily.     Marland Kitchen lidocaine (XYLOCAINE) 5 % ointment Apply 1 application topically 4 (four) times daily as needed. 35.44 g 1  . Multiple Vitamins-Minerals (HAIR SKIN AND NAILS FORMULA PO) Take 2 tablets by mouth daily.    . polyethylene glycol (MIRALAX / GLYCOLAX) 17 g packet Take 17 g by mouth 2 (two) times daily.     . pregabalin (LYRICA) 200 MG capsule TAKE ONE CAPSULE BY MOUTH TWICE DAILY 180 capsule 1  . QVAR REDIHALER 80  MCG/ACT inhaler Inhale 2 puffs into the lungs 2 (two) times daily.     . tamoxifen (NOLVADEX) 20 MG tablet Take 20 mg by mouth at bedtime.    Marland Kitchen venlafaxine (EFFEXOR) 75 MG tablet Take 1 tablet (75 mg total) by mouth daily. 90 tablet 0  . celecoxib (CELEBREX) 100 MG capsule One to 2 tablets by mouth daily as needed  for pain. 60 capsule 3  . diazepam (VALIUM) 5 MG tablet Take 5 mg by mouth as needed for anxiety.    . fluconazole (DIFLUCAN) 150 MG tablet Take 1 PO now and may repeat in 3 days if needed. 2 tablet 0  . traMADol (ULTRAM) 50 MG tablet Take 2 tablets (100 mg total) by mouth 3 (three) times daily as needed. 180 tablet 0  . traZODone (DESYREL) 100 MG tablet TAKE 1 & 1/2 TABLETS BY MOUTH AT BEDTIME (Patient taking differently: Take 150 mg by mouth at bedtime. TAKE 1 & 1/2 TABLETS BY MOUTH AT BEDTIME) 135 tablet 3   No facility-administered medications prior to visit.    Allergies  Allergen Reactions  . Azithromycin Diarrhea, Nausea And Vomiting and Other (See Comments)       . Augmentin [Amoxicillin-Pot Clavulanate] Diarrhea, Nausea And Vomiting and Other (See Comments)    Stomach cramps Did it involve swelling of the face/tongue/throat, SOB, or low BP? No Did it involve sudden or severe rash/hives, skin peeling, or any reaction on the inside of your mouth or nose? No Did you need to seek medical attention at a hospital or doctor's office? No When did it last happen?More than 3 years ago If all above answers are "NO", may proceed with cephalosporin use.   . Clonidine Derivatives Rash  . Flexeril [Cyclobenzaprine] Other (See Comments)    constipation  . Linzess [Linaclotide] Other (See Comments)    cramping  . Oxybutynin Rash  . Zolpidem Tartrate Other (See Comments)    Hallucinations    ROS Review of Systems    Objective:    Physical Exam  Constitutional: She is oriented to person, place, and time. She appears well-developed and well-nourished.  HENT:  Head:  Normocephalic and atraumatic.  Cardiovascular: Normal rate, regular rhythm and normal heart sounds.  Pulmonary/Chest: Effort normal and breath sounds normal.  Neurological: She is alert and oriented to person, place, and time.  Skin: Skin is warm and dry.  Psychiatric: She has a normal mood and affect. Her behavior is normal.    BP 122/75   Pulse 87   Ht 5\' 4"  (1.626 m)   Wt 147 lb (66.7 kg)   SpO2 98%   BMI 25.23 kg/m  Wt Readings from Last 3 Encounters:  12/20/19 147 lb (66.7 kg)  12/02/19 147 lb (66.7 kg)  09/11/19 147 lb (66.7 kg)     There are no preventive care reminders to display for this patient.  There are no preventive care reminders to display for this patient.  Lab Results  Component Value Date   TSH 1.235 10/09/2014   Lab Results  Component Value Date   WBC 5.3 10/09/2014   HGB 13.8 10/09/2014   HCT 41.7 10/09/2014   MCV 94.1 10/09/2014   PLT 249 10/09/2014   Lab Results  Component Value Date   NA 140 08/08/2016   NA 140 08/08/2016   K 4.3 08/08/2016   K 4.3 08/08/2016   CO2 27 10/09/2014   GLUCOSE 84 10/09/2014   BUN 12 08/08/2016   BUN 18 08/08/2016   CREATININE 0.7 08/08/2016   CREATININE 0.7 08/08/2016   BILITOT 0.5 10/09/2014   ALKPHOS 51 08/08/2016   AST 15 08/08/2016   AST 15 08/08/2016   ALT 13 08/08/2016   ALT 13 08/08/2016   PROT 6.4 10/09/2014   ALBUMIN 4.3 10/09/2014   CALCIUM 9.2 10/09/2014   Lab Results  Component Value Date   CHOL 146 08/08/2016  CHOL 146 08/08/2016   Lab Results  Component Value Date   HDL 48 08/08/2016   HDL 48 08/08/2016   Lab Results  Component Value Date   LDLCALC 85 08/08/2016   LDLCALC 85 08/08/2016   Lab Results  Component Value Date   TRIG 65 08/08/2016   TRIG 65 08/08/2016   No results found for: CHOLHDL No results found for: HGBA1C    Assessment & Plan:   Problem List Items Addressed This Visit      Cardiovascular and Mediastinum   Hot flash due to medication    We will  try gabapentin at bedtime start with 100 mg.  Monitor for any excess sedation.        Other   Insomnia    See note above in regards to adjusting trazodone if needed.      Relevant Medications   traZODone (DESYREL) 100 MG tablet   Anxiety and depression    Doing well on current regimen.  F/U in 4- 6 months.  Continue trazodone for sleep.  The okay to decrease her dose if feeling a little overly sedated especially with the addition of gabapentin at bedtime.      Relevant Medications   traZODone (DESYREL) 100 MG tablet    Other Visit Diagnoses    Hot flash, menopausal    -  Primary   Relevant Medications   gabapentin (NEURONTIN) 100 MG capsule   Vaginal discharge       Relevant Orders   WET PREP FOR [place], YEAST, CLUE      Meds ordered this encounter  Medications  . traZODone (DESYREL) 100 MG tablet    Sig: Take 1.5 tablets (150 mg total) by mouth at bedtime. TAKE 1 & 1/2 TABLETS BY MOUTH AT BEDTIME    Dispense:  135 tablet    Refill:  1  . gabapentin (NEURONTIN) 100 MG capsule    Sig: Take 1-3 capsules (100-300 mg total) by mouth at bedtime.    Dispense:  90 capsule    Refill:  3    Follow-up: Return in about 3 months (around 03/21/2020) for Mood.    Beatrice Lecher, MD

## 2019-12-20 NOTE — Assessment & Plan Note (Signed)
We will try gabapentin at bedtime start with 100 mg.  Monitor for any excess sedation.

## 2019-12-20 NOTE — Assessment & Plan Note (Signed)
Doing well on current regimen.  F/U in 4- 6 months.  Continue trazodone for sleep.  The okay to decrease her dose if feeling a little overly sedated especially with the addition of gabapentin at bedtime.

## 2019-12-20 NOTE — Assessment & Plan Note (Signed)
See note above in regards to adjusting trazodone if needed.

## 2019-12-21 LAB — WET PREP FOR TRICH, YEAST, CLUE
MICRO NUMBER:: 10532243
Specimen Quality: ADEQUATE

## 2019-12-24 MED ORDER — FLUCONAZOLE 150 MG PO TABS: 150.0000 mg | ORAL_TABLET | Freq: Once | ORAL | 1 refills | Status: AC

## 2019-12-24 MED ORDER — METRONIDAZOLE 500 MG PO TABS
500.0000 mg | ORAL_TABLET | Freq: Two times a day (BID) | ORAL | 0 refills | Status: DC
Start: 2019-12-24 — End: 2020-01-29

## 2019-12-24 NOTE — Addendum Note (Signed)
Addended by: Beatrice Lecher D on: 12/24/2019 07:35 AM   Modules accepted: Orders

## 2020-01-02 ENCOUNTER — Other Ambulatory Visit: Payer: Self-pay | Admitting: Family Medicine

## 2020-01-23 ENCOUNTER — Other Ambulatory Visit: Payer: Self-pay | Admitting: Family Medicine

## 2020-01-23 DIAGNOSIS — F5101 Primary insomnia: Secondary | ICD-10-CM

## 2020-01-29 ENCOUNTER — Encounter: Payer: Self-pay | Admitting: Sports Medicine

## 2020-01-29 ENCOUNTER — Ambulatory Visit (INDEPENDENT_AMBULATORY_CARE_PROVIDER_SITE_OTHER): Payer: BC Managed Care – PPO | Admitting: Sports Medicine

## 2020-01-29 ENCOUNTER — Ambulatory Visit (INDEPENDENT_AMBULATORY_CARE_PROVIDER_SITE_OTHER): Payer: BC Managed Care – PPO

## 2020-01-29 ENCOUNTER — Other Ambulatory Visit: Payer: Self-pay

## 2020-01-29 DIAGNOSIS — S99921A Unspecified injury of right foot, initial encounter: Secondary | ICD-10-CM

## 2020-01-29 HISTORY — DX: Unspecified injury of right foot, initial encounter: S99.921A
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
## 2020-02-12 ENCOUNTER — Ambulatory Visit: Payer: BC Managed Care – PPO | Admitting: Sports Medicine

## 2020-02-20 IMAGING — DX ABDOMEN - 1 VIEW
2 series · 2 of 2 positions shown · non-contrast
Comparison: 09/05/2011.

CLINICAL DATA: Constipation.  Tracking Sitz markers.

EXAM:
ABDOMEN - 1 VIEW

[abdomen kub (1 of 2)]
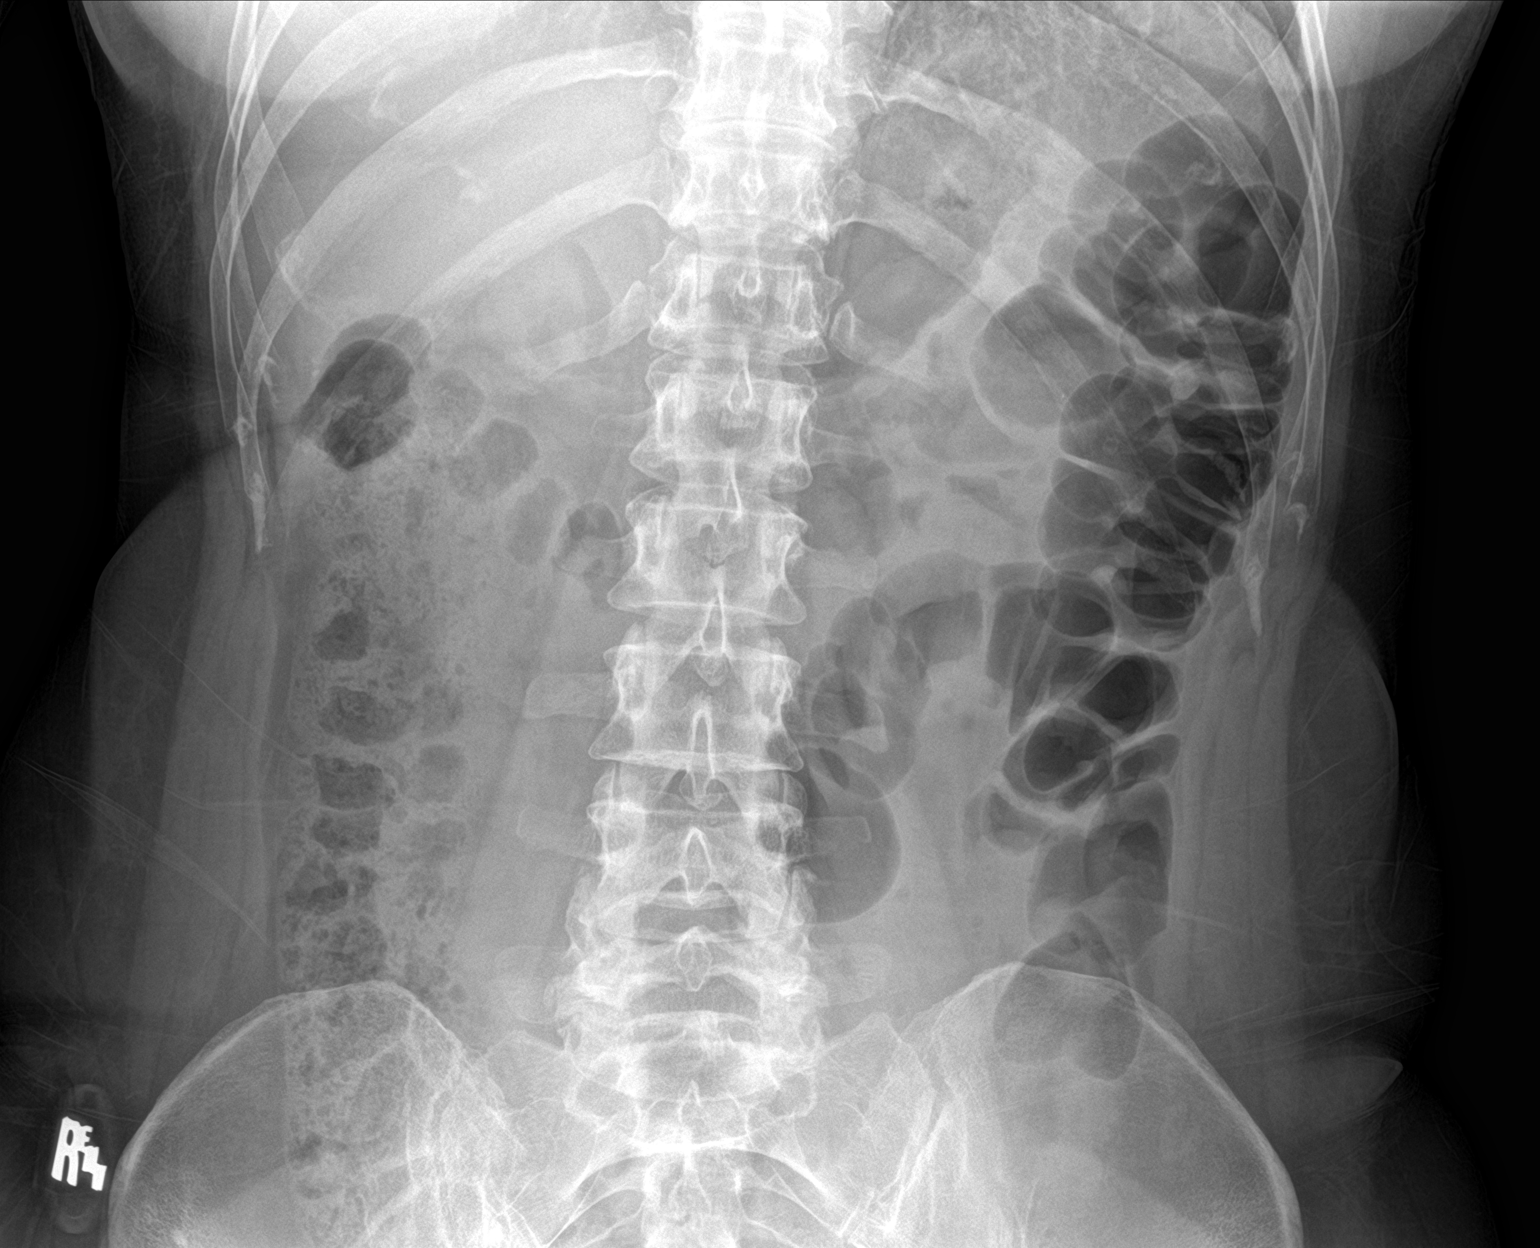

[abdomen kub (2 of 2)]
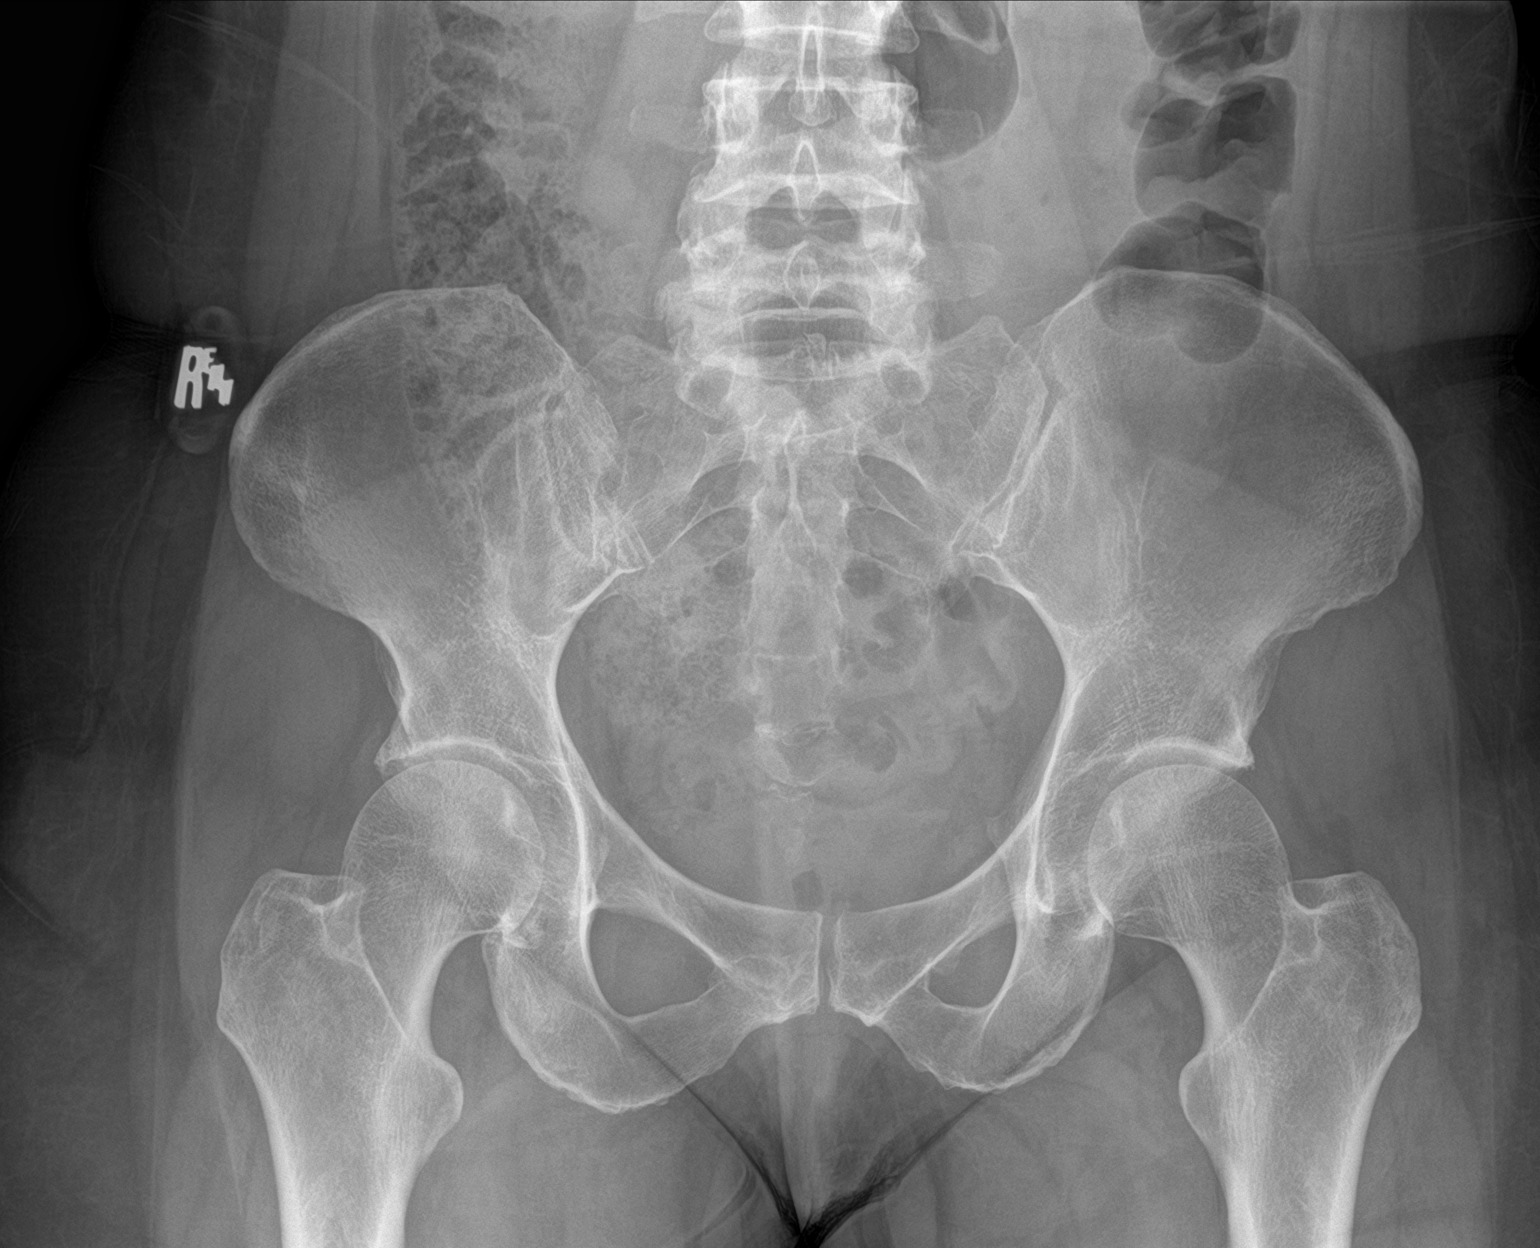

[2 of 2 positions shown; findings below may reference images not displayed]

FINDINGS: Gas pattern is nonspecific. Moderate stool volume right colon. No
bowel distention or free air. No Sitz markers noted. No acute bony
abnormality. Small right upper quadrant calcification. Gallstone can
not be excluded.
IMPRESSION: 1. Gas pattern is nonspecific. Moderate stool volume right colon. No
bowel distention or free air. No Sitz markers noted.

2. Small right upper quadrant calcification. Gallstone can not be
excluded.

## 2020-02-24 ENCOUNTER — Other Ambulatory Visit: Payer: Self-pay | Admitting: Family Medicine

## 2020-02-28 ENCOUNTER — Ambulatory Visit (INDEPENDENT_AMBULATORY_CARE_PROVIDER_SITE_OTHER): Payer: BC Managed Care – PPO | Admitting: Family Medicine

## 2020-02-28 ENCOUNTER — Encounter: Payer: Self-pay | Admitting: Family Medicine

## 2020-02-28 VITALS — BP 120/73 | HR 81 | Ht 64.0 in | Wt 152.0 lb

## 2020-02-28 DIAGNOSIS — H8111 Benign paroxysmal vertigo, right ear: Secondary | ICD-10-CM | POA: Diagnosis not present

## 2020-02-28 DIAGNOSIS — Z20822 Contact with and (suspected) exposure to covid-19: Secondary | ICD-10-CM | POA: Diagnosis not present

## 2020-02-28 DIAGNOSIS — R42 Dizziness and giddiness: Secondary | ICD-10-CM | POA: Diagnosis not present

## 2020-02-28 DIAGNOSIS — H6983 Other specified disorders of Eustachian tube, bilateral: Secondary | ICD-10-CM

## 2020-02-28 MED ORDER — MECLIZINE HCL 12.5 MG PO TABS: 12.5000 mg | ORAL_TABLET | Freq: Three times a day (TID) | ORAL | 1 refills | Status: DC | PRN

## 2020-02-28 NOTE — Progress Notes (Signed)
Established Patient Office Visit  Subjective:  Patient ID: Tara Howard, female    DOB: 1971-01-29  Age: 49 y.o. MRN: [id number]  CC:  Chief Complaint  Patient presents with  . Dizziness    on and off for sometime. she questions whether it could be fluid in her ears or if it is caused by the Gabapentin  . covid testing    she is traveling to [place] and will need to have covid test done prior     HPI Tara Howard presents for dizziness that comes and goes for 3 weeks.  She says she actually came in today because in the last 2 weeks has been bad enough about three times that she is actually had to take some old meclizine that she had for about a year ago.  She has not noticed if it is worse if she turns her head to one side versus the other.  But it definitely is worse if she turns her head or looks down.  She says it also seems to be worse when she first gets up in the morning and tries to stand up to get out of bed.  She denies any recent cold or upper respiratory symptoms.  She says she always has a little bit of a cough where she clears her throat from her reflux but is not new she has had a little bit of earache on and off for the last week or two as well but no ear drainage and no fever or chills.  She does take her allergy medicine including oral antihistamine and Flonase.  Is also traveling to [place] to visit family which is where she is originally from and needs a Covid test prior to travel.  Past Medical History:  Diagnosis Date  . 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 Anxiety   . Asthma   . Breast cancer ([place])   . Chronic headache   . Depression   . Fibromyalgia   . Fibromyalgia 2006   pain / headaches  . GERD (gastroesophageal reflux disease)   . IBS (irritable bowel syndrome)   . Osteoarthritis    in her hips  . Vaginismus     Past Surgical History:  Procedure Laterality Date  . bilateral mastectomy  11/01/2018  . BRAVO [place] STUDY N/A 03/26/2019   Procedure: BRAVO [place];  Surgeon: Lavena Bullion, DO;  Location: [facility];  Service: Gastroenterology;  Laterality: N/A;  . BRAVO [place] STUDY N/A 06/27/2019   Procedure: BRAVO [place];  Surgeon: Lavena Bullion, DO;  Location: [facility];  Service: Gastroenterology;  Laterality: N/A;  . BREAST LUMPECTOMY Left 09/2018  . BREAST RECONSTRUCTION  12/2018  . CESAREAN SECTION     X 2  . ENDOMETRIAL ABLATION    . ESOPHAGOGASTRODUODENOSCOPY     said it was done maybe about 10 years ago. In [place]  . ESOPHAGOGASTRODUODENOSCOPY (EGD) WITH PROPOFOL N/A 03/26/2019   Procedure: ESOPHAGOGASTRODUODENOSCOPY (EGD) WITH PROPOFOL;  Surgeon: Lavena Bullion, DO;  Location: [facility];  Service: Gastroenterology;  Laterality: N/A;  . ESOPHAGOGASTRODUODENOSCOPY (EGD) WITH PROPOFOL N/A 06/27/2019   Procedure: ESOPHAGOGASTRODUODENOSCOPY (EGD) WITH PROPOFOL;  Surgeon: Lavena Bullion, DO;  Location: [facility];  Service: Gastroenterology;  Laterality: N/A;  . TONSILLECTOMY    . TONSILLECTOMY    . TUBAL LIGATION    . UPPER GASTROINTESTINAL ENDOSCOPY      Family History  Problem Relation Age of Onset  . CAD Father  3 vessel bypass  . Depression Mother   . Hypertension Mother   . Heart attack Mother   . CAD Mother        3 vessel bypass  . Irritable bowel syndrome Mother   . Aneurysm Mother        brain  . Cancer Paternal Grandmother        of the bowel per patient   . Esophageal cancer Paternal Grandmother     Social History   Socioeconomic History  . Marital status: Legally Separated    Spouse name: Not on file  . Number of children: 2  . Years of education: Not on file  . Highest education level: Not on file  Occupational History  . Occupation: Wellsite geologist: [employer]    Comment: [employer]  Tobacco Use  . Smoking status: Never Smoker  . Smokeless tobacco: Never Used  Vaping Use  . Vaping Use: Never used  Substance and Sexual Activity  .  Alcohol use: Yes    Comment: rarely   . Drug use: Never  . Sexual activity: Not Currently    Birth control/protection: Surgical  Other Topics Concern  . Not on file  Social History Narrative  . Not on file   Social Determinants of Health   Financial Resource Strain:   . Difficulty of Paying Living Expenses:   Food Insecurity:   . Worried About Charity fundraiser in the Last Year:   . Arboriculturist in the Last Year:   Transportation Needs:   . Film/video editor (Medical):   Marland Kitchen Lack of Transportation (Non-Medical):   Physical Activity:   . Days of Exercise per Week:   . Minutes of Exercise per Session:   Stress:   . Feeling of Stress :   Social Connections:   . Frequency of Communication with Friends and Family:   . Frequency of Social Gatherings with Friends and Family:   . Attends Religious Services:   . Active Member of Clubs or Organizations:   . Attends Archivist Meetings:   Marland Kitchen Marital Status:   Intimate Partner Violence:   . Fear of Current or Ex-Partner:   . Emotionally Abused:   Marland Kitchen Physically Abused:   . Sexually Abused:     Outpatient Medications Prior to Visit  Medication Sig Dispense Refill  . DEXILANT 60 MG capsule TAKE 1 CAPSULE BY MOUTH EVERY DAY 30 capsule 11  . acetaminophen (TYLENOL) 500 MG tablet Take 1,500 mg by mouth 2 (two) times daily as needed for moderate pain or headache.    . Cholecalciferol (DIALYVITE VITAMIN D 5000) 125 MCG (5000 UT) capsule Take 5,000 Units by mouth daily.    . fluticasone (FLONASE) 50 MCG/ACT nasal spray Place 1 spray into both nostrils daily.    Marland Kitchen gabapentin (NEURONTIN) 100 MG capsule Take 1-3 capsules (100-300 mg total) by mouth at bedtime. 90 capsule 3  . Multiple Vitamins-Minerals (HAIR SKIN AND NAILS FORMULA PO) Take 2 tablets by mouth daily.    . polyethylene glycol (MIRALAX / GLYCOLAX) 17 g packet Take 17 g by mouth 2 (two) times daily.     . pregabalin (LYRICA) 200 MG capsule TAKE ONE CAPSULE BY MOUTH  TWICE DAILY 180 capsule 1  . QVAR REDIHALER 80 MCG/ACT inhaler Inhale 2 puffs into the lungs 2 (two) times daily.     . tamoxifen (NOLVADEX) 20 MG tablet Take 20 mg by mouth at bedtime.    Marland Kitchen  traZODone (DESYREL) 100 MG tablet Take 1.5 tablets (150 mg total) by mouth at bedtime. TAKE 1 & 1/2 TABLETS BY MOUTH AT BEDTIME 135 tablet 1  . venlafaxine (EFFEXOR) 75 MG tablet TAKE ONE TABLET BY MOUTH EVERY DAY 90 tablet 1   No facility-administered medications prior to visit.    Allergies  Allergen Reactions  . Azithromycin Diarrhea, Nausea And Vomiting and Other (See Comments)       . Augmentin [Amoxicillin-Pot Clavulanate] Diarrhea, Nausea And Vomiting and Other (See Comments)    Stomach cramps Did it involve swelling of the face/tongue/throat, SOB, or low BP? No Did it involve sudden or severe rash/hives, skin peeling, or any reaction on the inside of your mouth or nose? No Did you need to seek medical attention at a hospital or doctor's office? No When did it last happen?More than 3 years ago If all above answers are "NO", may proceed with cephalosporin use.   . Clonidine Derivatives Rash  . Flexeril [Cyclobenzaprine] Other (See Comments)    constipation  . Linzess [Linaclotide] Other (See Comments)    cramping  . Oxybutynin Rash  . Zolpidem Tartrate Other (See Comments)    Hallucinations    ROS Review of Systems    Objective:    Physical Exam Constitutional:      Appearance: She is well-developed.  HENT:     Head: Normocephalic and atraumatic.     Right Ear: Tympanic membrane, ear canal and external ear normal.     Left Ear: Tympanic membrane, ear canal and external ear normal.     Nose: Nose normal.     Mouth/Throat:     Mouth: Mucous membranes are moist.     Pharynx: No oropharyngeal exudate.  Eyes:     Conjunctiva/sclera: Conjunctivae normal.     Pupils: Pupils are equal, round, and reactive to light.  Neck:     Thyroid: No thyromegaly.  Cardiovascular:      Rate and Rhythm: Normal rate and regular rhythm.     Heart sounds: Normal heart sounds.  Pulmonary:     Effort: Pulmonary effort is normal.     Breath sounds: Normal breath sounds. No wheezing.  Musculoskeletal:     Cervical back: Neck supple.  Lymphadenopathy:     Cervical: No cervical adenopathy.  Skin:    General: Skin is warm and dry.  Neurological:     Mental Status: She is alert and oriented to person, place, and time.     Cranial Nerves: No cranial nerve deficit.     Comments: Positive Dix-Hallpike maneuver to the right.  She had a little bit of dizziness to the left but no nystagmus.  Psychiatric:        Mood and Affect: Mood normal.        Behavior: Behavior normal.     BP 120/73   Pulse 81   Ht 5\' 4"  (1.626 m)   Wt 152 lb (68.9 kg)   SpO2 99%   BMI 26.09 kg/m  Wt Readings from Last 3 Encounters:  02/28/20 152 lb (68.9 kg)  12/20/19 147 lb (66.7 kg)  12/02/19 147 lb (66.7 kg)     Health Maintenance Due  Topic Date Due  . Hepatitis C Screening  Never done  . INFLUENZA VACCINE  02/23/2020    There are no preventive care reminders to display for this patient.  Lab Results  Component Value Date   TSH 1.235 10/09/2014   Lab Results  Component Value Date  WBC 5.3 10/09/2014   HGB 13.8 10/09/2014   HCT 41.7 10/09/2014   MCV 94.1 10/09/2014   PLT 249 10/09/2014   Lab Results  Component Value Date   NA 140 08/08/2016   NA 140 08/08/2016   K 4.3 08/08/2016   K 4.3 08/08/2016   CO2 27 10/09/2014   GLUCOSE 84 10/09/2014   BUN 12 08/08/2016   BUN 18 08/08/2016   CREATININE 0.7 08/08/2016   CREATININE 0.7 08/08/2016   BILITOT 0.5 10/09/2014   ALKPHOS 51 08/08/2016   AST 15 08/08/2016   AST 15 08/08/2016   ALT 13 08/08/2016   ALT 13 08/08/2016   PROT 6.4 10/09/2014   ALBUMIN 4.3 10/09/2014   CALCIUM 9.2 10/09/2014   Lab Results  Component Value Date   CHOL 146 08/08/2016   CHOL 146 08/08/2016   Lab Results  Component Value Date   HDL 48  08/08/2016   HDL 48 08/08/2016   Lab Results  Component Value Date   LDLCALC 85 08/08/2016   LDLCALC 85 08/08/2016   Lab Results  Component Value Date   TRIG 65 08/08/2016   TRIG 65 08/08/2016   No results found for: CHOLHDL No results found for: HGBA1C    Assessment & Plan:   Problem List Items Addressed This Visit    None    Visit Diagnoses    Encounter for screening laboratory testing for COVID-19 virus in asymptomatic patient    -  Primary   Relevant Orders   Novel Coronavirus, NAA ([facility])   Dizziness       BPPV (benign paroxysmal positional vertigo), right       Dysfunction of both eustachian tubes         BPPV, right side-discussed diagnosis.  She is actually had similar symptoms in the past.  Positive Dix-Hallpike maneuver on the right.  Given vertigo exercises to do on her own.  I do think she also has a little eustachian tube dysfunction which could also be exacerbating her symptoms.  Please see note below.  Dizziness -is consistent with BPPV and combination of eustachian tube dysfunction.  Dysfunction of eustachian tubes-since already on antihistamine and nasal steroid spray then recommend oral Sudafed.  If not improving then please let me know.  Covid testing for travel outside of the country.   Meds ordered this encounter  Medications  . meclizine (ANTIVERT) 12.5 MG tablet    Sig: Take 1 tablet (12.5 mg total) by mouth 3 (three) times daily as needed for dizziness or nausea.    Dispense:  30 tablet    Refill:  1    Follow-up: No follow-ups on file.    Beatrice Lecher, MD

## 2020-03-01 LAB — NOVEL CORONAVIRUS, NAA: SARS-CoV-2, NAA: NOT DETECTED

## 2020-03-01 LAB — SARS-COV-2, NAA 2 DAY TAT

## 2020-03-10 ENCOUNTER — Other Ambulatory Visit: Payer: Self-pay | Admitting: Family Medicine

## 2020-03-20 ENCOUNTER — Ambulatory Visit: Payer: BC Managed Care – PPO | Admitting: Family Medicine

## 2020-03-24 ENCOUNTER — Ambulatory Visit: Payer: BC Managed Care – PPO | Admitting: Family Medicine

## 2020-03-25 ENCOUNTER — Ambulatory Visit: Payer: BC Managed Care – PPO | Admitting: Family Medicine

## 2020-04-07 ENCOUNTER — Telehealth (INDEPENDENT_AMBULATORY_CARE_PROVIDER_SITE_OTHER): Payer: BC Managed Care – PPO | Admitting: Medical-Surgical

## 2020-04-07 ENCOUNTER — Encounter: Payer: Self-pay | Admitting: Medical-Surgical

## 2020-04-07 DIAGNOSIS — K219 Gastro-esophageal reflux disease without esophagitis: Secondary | ICD-10-CM

## 2020-04-07 DIAGNOSIS — R42 Dizziness and giddiness: Secondary | ICD-10-CM | POA: Diagnosis not present

## 2020-04-07 DIAGNOSIS — R110 Nausea: Secondary | ICD-10-CM | POA: Diagnosis not present

## 2020-04-07 DIAGNOSIS — G43009 Migraine without aura, not intractable, without status migrainosus: Secondary | ICD-10-CM | POA: Diagnosis not present

## 2020-04-07 NOTE — Progress Notes (Signed)
Virtual Visit via Video Note  I connected with Tara Howard on 04/07/20 at  2:00 PM EDT by a video enabled telemedicine application and verified that I am speaking with the correct person using two identifiers.   I discussed the limitations of evaluation and management by telemedicine and the availability of in person appointments. The patient expressed understanding and agreed to proceed.  Patient location: home Provider locations: office  Subjective:    CC: nausea, dizziness, headaches  HPI: Pleasant 49 year old female presenting via MyChart video visit with complaints of continued dizziness and frequent headaches. She has also been experiencing an increase in nausea.   Dizziness- was seen by her PCP in early August for dizziness where it was determined she had vertigo. She was given meclizine and home exercises to do. She notes the symptoms have come and gone but she has been consistently dizzy for the last 3 weeks. Dizziness is constant, worse in the morning but improves a little in the afternoons. Affects her when she moves her head/eyes a certain way or looks as certain surfaces/patterns. Meclizine has helped a little. Home exercises not very helpful.  HA/migraines- history of migraines but has had a daily headache since school started back. 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 She is treating her headaches with Tylenol with a caffeine pill. Headaches are present on awakening and usually resolve by the afternoon.   Nausea- history of severe reflux resistant to several medications. Currently taking Dexilant in the morning and Pepcid at night. Following the dietary restrictions appropriate for reflux. Has been followed by Dr. Bryan Lemma. Feels nauseated but no vomiting. Worse in the morning but does ease up a little after eating lunch. No abdominal pain. Constant throat clearing. Bad taste in her mouth on awakening. Feels the reflux in her chest at times.  Reflux symptoms worse at night when supine and when bending over.    Past medical history, Surgical history, Family history not pertinant except as noted below, Social history, Allergies, and medications have been entered into the medical record, reviewed, and corrections made.   Review of Systems: See HPI for pertinent positives and negatives.   Objective:    General: Speaking clearly in complete sentences without any shortness of breath.  Alert and oriented x3.  Normal judgment. No apparent acute distress.  Impression and Recommendations:    1. Dizziness Continued dizziness and daily headaches concerning. Ordering MRI brain with/without contrast. Referring for formal vestibular therapy with her current PT location ([facility] in [place]). - MR Brain W Wo Contrast; Future - Ambulatory referral to Physical Therapy  2. Migraine without aura and without status migrainosus, not intractable MR Brain with/without contrast - MR Brain W Wo Contrast; Future  3. Gastroesophageal reflux disease without esophagitis Recommend contacting her GI specialist for further recommendations as she has tried and failed most of the treatments we use.   4. Nausea Continue meclizine as needed. Offered Zofran/phenergan but patient reports neither of these work. Referring for vestibular therapy. Avoid foods that exacerbate GERD. Contact GI for further evaluation and/or recommendations. - Ambulatory referral to Physical Therapy  Return if symptoms worsen or fail to improve.  20 minutes of non face-to-face time was provided during this encounter.  I discussed the assessment and treatment plan with the patient. The patient was provided an opportunity to ask questions and all were answered. The patient agreed with the plan and demonstrated an understanding of the instructions.   The patient was  advised to call back or seek an in-person evaluation if the symptoms worsen or if the condition fails to improve as  anticipated.  Clearnce Sorrel, DNP, APRN, FNP-BC [facility]

## 2020-04-08 ENCOUNTER — Telehealth: Payer: Self-pay | Admitting: Gastroenterology

## 2020-04-08 NOTE — Telephone Encounter (Signed)
Patient states she is having bad Gerd symptoms and is seeking advise

## 2020-04-08 NOTE — Telephone Encounter (Signed)
Spoke to patient to get her an appointment with the next available APP. She has opted to discus this matter with her PCP next week. She will call us next week with an update. She will schedule an appointment with our office if no relief from PCP.

## 2020-04-14 ENCOUNTER — Ambulatory Visit: Payer: BC Managed Care – PPO | Admitting: Family Medicine

## 2020-04-14 ENCOUNTER — Ambulatory Visit (INDEPENDENT_AMBULATORY_CARE_PROVIDER_SITE_OTHER): Payer: BC Managed Care – PPO

## 2020-04-14 ENCOUNTER — Encounter: Payer: Self-pay | Admitting: Family Medicine

## 2020-04-14 ENCOUNTER — Other Ambulatory Visit: Payer: Self-pay

## 2020-04-14 VITALS — BP 108/70 | HR 83 | Ht 64.0 in | Wt 153.0 lb

## 2020-04-14 DIAGNOSIS — G43009 Migraine without aura, not intractable, without status migrainosus: Secondary | ICD-10-CM

## 2020-04-14 DIAGNOSIS — G8929 Other chronic pain: Secondary | ICD-10-CM

## 2020-04-14 DIAGNOSIS — J4530 Mild persistent asthma, uncomplicated: Secondary | ICD-10-CM

## 2020-04-14 DIAGNOSIS — Z23 Encounter for immunization: Secondary | ICD-10-CM | POA: Diagnosis not present

## 2020-04-14 DIAGNOSIS — H8110 Benign paroxysmal vertigo, unspecified ear: Secondary | ICD-10-CM

## 2020-04-14 DIAGNOSIS — K219 Gastro-esophageal reflux disease without esophagitis: Secondary | ICD-10-CM | POA: Diagnosis not present

## 2020-04-14 DIAGNOSIS — M25561 Pain in right knee: Secondary | ICD-10-CM

## 2020-04-14 MED ORDER — SUCRALFATE 1 G PO TABS: 1.0000 g | ORAL_TABLET | Freq: Three times a day (TID) | ORAL | 1 refills | Status: DC

## 2020-04-14 NOTE — Assessment & Plan Note (Signed)
She is already on a have 150 mg of Pepcid at bedtime in addition to the Dexilant 60 mg in the morning.  Avoid all caffeine products really switch back to a bland reflux diet.  Avoid all greasy spicy and acidic foods.  We will add Carafate to current regimen to try to calm her stomach down over the next week or 2.

## 2020-04-14 NOTE — Assessment & Plan Note (Signed)
She is having almost daily headaches.  She feels like it is related to the tension in her neck and has been going to physical therapy.  We discussed maybe a muscle relaxer but she says it messes with her IBS.  We discussed we could also consider trial of Topamax or beta-blocker but she wants to hold off for now.  Did encourage her to stop the caffeine though and just take with the Tylenol and allergy medication as needed.

## 2020-04-14 NOTE — Patient Instructions (Signed)
Stop all caffeine products. Cut out out acidic foods, greasy and spicy, etc.

## 2020-04-14 NOTE — Assessment & Plan Note (Signed)
Chronic right knee pain-no significant crepitus on exam.  Slight increased laxity on the right compared to the left.  Negative McMurray's.  With patellar compression she did have some crepitus and grinding.

## 2020-04-14 NOTE — Assessment & Plan Note (Signed)
Ports that she has been using her inhalers regularly.  Feels that the increased reflux symptoms have aggravated her asthma.

## 2020-04-14 NOTE — Progress Notes (Addendum)
Established Patient Office Visit  Subjective:  Patient ID: Tara Howard, female    DOB: 15-Feb-1971  Age: 49 y.o. MRN: [id number]  CC:  Chief Complaint  Patient presents with  . mood  . Knee Pain    HPI Tara Howard presents for right knee pain that is been bothering her on and off since college.  She says it is painful with flexion.  Reports that her GERD is a little bit worse over the last 2 to 3 weeks..  She does not feel like she is eating anything that should be really triggering her GERD but she is also not eating a super bland diet either.  She reports she has been taking her Dexilant regularly.  Reflux has been severe enough that it is actually been causing a burning sensation in her chest and her throat, she has been getting voice hoarseness and getting phlegm production and cough and throat clearing.  She said she tried increasing her Tums but it did not really seem to help.  And she actually increased her Pepcid at night to 2 tabs over the last week.  She reports that she had recently started taking caffeine pills in the morning to help with her migraines.  Having daily HA in the mornings. Feels liek related to tension in her Neck.  Currently enrolled in formal physical therapy.   Also has been having intermittent vertigo on and off for more than a year.  They recently started doing some vestibular rehab treatments with her physical therapy.  Past Medical History:  Diagnosis Date  . 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 Anxiety   . Asthma   . Breast cancer ([place])   . Chronic headache   . Depression   . Fibromyalgia   . Fibromyalgia 2006   pain / headaches  . GERD (gastroesophageal reflux disease)   . IBS (irritable bowel syndrome)   . Osteoarthritis    in her hips  . Vaginismus     Past Surgical History:  Procedure Laterality Date  . bilateral mastectomy  11/01/2018  . BRAVO [place] STUDY N/A 03/26/2019   Procedure: BRAVO [place];  Surgeon: Lavena Bullion, DO;   Location: [facility];  Service: Gastroenterology;  Laterality: N/A;  . BRAVO [place] STUDY N/A 06/27/2019   Procedure: BRAVO [place];  Surgeon: Lavena Bullion, DO;  Location: [facility];  Service: Gastroenterology;  Laterality: N/A;  . BREAST LUMPECTOMY Left 09/2018  . BREAST RECONSTRUCTION  12/2018  . CESAREAN SECTION     X 2  . ENDOMETRIAL ABLATION    . ESOPHAGOGASTRODUODENOSCOPY     said it was done maybe about 10 years ago. In [place]  . ESOPHAGOGASTRODUODENOSCOPY (EGD) WITH PROPOFOL N/A 03/26/2019   Procedure: ESOPHAGOGASTRODUODENOSCOPY (EGD) WITH PROPOFOL;  Surgeon: Lavena Bullion, DO;  Location: [facility];  Service: Gastroenterology;  Laterality: N/A;  . ESOPHAGOGASTRODUODENOSCOPY (EGD) WITH PROPOFOL N/A 06/27/2019   Procedure: ESOPHAGOGASTRODUODENOSCOPY (EGD) WITH PROPOFOL;  Surgeon: Lavena Bullion, DO;  Location: [facility];  Service: Gastroenterology;  Laterality: N/A;  . TONSILLECTOMY    . TONSILLECTOMY    . TUBAL LIGATION    . UPPER GASTROINTESTINAL ENDOSCOPY      Family History  Problem Relation Age of Onset  . CAD Father        3 vessel bypass  . Depression Mother   . Hypertension Mother   . Heart attack Mother   . CAD Mother        3  vessel bypass  . Irritable bowel syndrome Mother   . Aneurysm Mother        brain  . Cancer Paternal Grandmother        of the bowel per patient   . Esophageal cancer Paternal Grandmother     Social History   Socioeconomic History  . Marital status: Legally Separated    Spouse name: Not on file  . Number of children: 2  . Years of education: Not on file  . Highest education level: Not on file  Occupational History  . Occupation: Wellsite geologist: [employer]    Comment: [employer]  Tobacco Use  . Smoking status: Never Smoker  . Smokeless tobacco: Never Used  Vaping Use  . Vaping Use: Never used  Substance and Sexual Activity  . Alcohol use: Yes    Comment: rarely   .  Drug use: Never  . Sexual activity: Not Currently    Birth control/protection: Surgical  Other Topics Concern  . Not on file  Social History Narrative  . Not on file   Social Determinants of Health   Financial Resource Strain:   . Difficulty of Paying Living Expenses: Not on file  Food Insecurity:   . Worried About Charity fundraiser in the Last Year: Not on file  . Ran Out of Food in the Last Year: Not on file  Transportation Needs:   . Lack of Transportation (Medical): Not on file  . Lack of Transportation (Non-Medical): Not on file  Physical Activity:   . Days of Exercise per Week: Not on file  . Minutes of Exercise per Session: Not on file  Stress:   . Feeling of Stress : Not on file  Social Connections:   . Frequency of Communication with Friends and Family: Not on file  . Frequency of Social Gatherings with Friends and Family: Not on file  . Attends Religious Services: Not on file  . Active Member of Clubs or Organizations: Not on file  . Attends Archivist Meetings: Not on file  . Marital Status: Not on file  Intimate Partner Violence:   . Fear of Current or Ex-Partner: Not on file  . Emotionally Abused: Not on file  . Physically Abused: Not on file  . Sexually Abused: Not on file    Outpatient Medications Prior to Visit  Medication Sig Dispense Refill  . acetaminophen (TYLENOL) 500 MG tablet Take 1,500 mg by mouth 2 (two) times daily as needed for moderate pain or headache.    . Cholecalciferol (DIALYVITE VITAMIN D 5000) 125 MCG (5000 UT) capsule Take 5,000 Units by mouth daily.    Marland Kitchen DEXILANT 60 MG capsule TAKE 1 CAPSULE BY MOUTH EVERY DAY 30 capsule 11  . fluticasone (FLONASE) 50 MCG/ACT nasal spray Place 1 spray into both nostrils daily.    Marland Kitchen gabapentin (NEURONTIN) 100 MG capsule Take 1-3 capsules (100-300 mg total) by mouth at bedtime. 90 capsule 3  . levalbuterol (XOPENEX HFA) 45 MCG/ACT inhaler Inhale 2 puffs into the lungs every 6 (six) hours as  needed for wheezing or shortness of breath. Pt will call when needs to filled. 15 g 1  . meclizine (ANTIVERT) 12.5 MG tablet Take 1 tablet (12.5 mg total) by mouth 3 (three) times daily as needed for dizziness or nausea. 30 tablet 1  . Multiple Vitamins-Minerals (HAIR SKIN AND NAILS FORMULA PO) Take 2 tablets by mouth daily.    . polyethylene glycol (MIRALAX /  GLYCOLAX) 17 g packet Take 17 g by mouth 2 (two) times daily.     . pregabalin (LYRICA) 200 MG capsule TAKE ONE CAPSULE BY MOUTH TWICE DAILY 180 capsule 1  . QVAR REDIHALER 80 MCG/ACT inhaler Inhale 2 puffs into the lungs 2 (two) times daily.     . tamoxifen (NOLVADEX) 20 MG tablet Take 20 mg by mouth at bedtime.    . traZODone (DESYREL) 100 MG tablet Take 1.5 tablets (150 mg total) by mouth at bedtime. TAKE 1 & 1/2 TABLETS BY MOUTH AT BEDTIME 135 tablet 1  . venlafaxine (EFFEXOR) 75 MG tablet TAKE ONE TABLET BY MOUTH EVERY DAY 90 tablet 1   No facility-administered medications prior to visit.    Allergies  Allergen Reactions  . Azithromycin Diarrhea, Nausea And Vomiting and Other (See Comments)       . Augmentin [Amoxicillin-Pot Clavulanate] Diarrhea, Nausea And Vomiting and Other (See Comments)    Stomach cramps Did it involve swelling of the face/tongue/throat, SOB, or low BP? No Did it involve sudden or severe rash/hives, skin peeling, or any reaction on the inside of your mouth or nose? No Did you need to seek medical attention at a hospital or doctor's office? No When did it last happen?More than 3 years ago If all above answers are "NO", may proceed with cephalosporin use.   . Clonidine Derivatives Rash  . Flexeril [Cyclobenzaprine] Other (See Comments)    constipation  . Linzess [Linaclotide] Other (See Comments)    cramping  . Oxybutynin Rash  . Zolpidem Tartrate Other (See Comments)    Hallucinations    ROS Review of Systems    Objective:    Physical Exam Constitutional:      Appearance: She is  well-developed.  HENT:     Head: Normocephalic and atraumatic.  Cardiovascular:     Rate and Rhythm: Normal rate and regular rhythm.     Heart sounds: Normal heart sounds.  Pulmonary:     Effort: Pulmonary effort is normal.     Breath sounds: Normal breath sounds.  Abdominal:     General: Abdomen is flat. Bowel sounds are normal. There is no distension.     Palpations: Abdomen is soft.     Tenderness: There is abdominal tenderness.     Comments: TTP in   In the epigastric and left upper quadrant area.  Skin:    General: Skin is warm and dry.  Neurological:     Mental Status: She is alert and oriented to person, place, and time.  Psychiatric:        Behavior: Behavior normal.     BP 108/70   Pulse 83   Ht 5\' 4"  (1.626 m)   Wt 153 lb (69.4 kg)   SpO2 100%   BMI 26.26 kg/m  Wt Readings from Last 3 Encounters:  04/14/20 153 lb (69.4 kg)  02/28/20 152 lb (68.9 kg)  12/20/19 147 lb (66.7 kg)     There are no preventive care reminders to display for this patient.  There are no preventive care reminders to display for this patient.  Lab Results  Component Value Date   TSH 1.235 10/09/2014   Lab Results  Component Value Date   WBC 5.3 10/09/2014   HGB 13.8 10/09/2014   HCT 41.7 10/09/2014   MCV 94.1 10/09/2014   PLT 249 10/09/2014   Lab Results  Component Value Date   NA 140 08/08/2016   NA 140 08/08/2016   K 4.3 08/08/2016  K 4.3 08/08/2016   CO2 27 10/09/2014   GLUCOSE 84 10/09/2014   BUN 12 08/08/2016   BUN 18 08/08/2016   CREATININE 0.7 08/08/2016   CREATININE 0.7 08/08/2016   BILITOT 0.5 10/09/2014   ALKPHOS 51 08/08/2016   AST 15 08/08/2016   AST 15 08/08/2016   ALT 13 08/08/2016   ALT 13 08/08/2016   PROT 6.4 10/09/2014   ALBUMIN 4.3 10/09/2014   CALCIUM 9.2 10/09/2014   Lab Results  Component Value Date   CHOL 146 08/08/2016   CHOL 146 08/08/2016   Lab Results  Component Value Date   HDL 48 08/08/2016   HDL 48 08/08/2016   Lab  Results  Component Value Date   LDLCALC 85 08/08/2016   LDLCALC 85 08/08/2016   Lab Results  Component Value Date   TRIG 65 08/08/2016   TRIG 65 08/08/2016   No results found for: CHOLHDL No results found for: HGBA1C    Assessment & Plan:   Problem List Items Addressed This Visit      Cardiovascular and Mediastinum   Migraines    She is having almost daily headaches.  She feels like it is related to the tension in her neck and has been going to physical therapy.  We discussed maybe a muscle relaxer but she says it messes with her IBS.  We discussed we could also consider trial of Topamax or beta-blocker but she wants to hold off for now.  Did encourage her to stop the caffeine though and just take with the Tylenol and allergy medication as needed.        Respiratory   Asthma    Ports that she has been using her inhalers regularly.  Feels that the increased reflux symptoms have aggravated her asthma.        Digestive   GERD    She is already on a have 150 mg of Pepcid at bedtime in addition to the Dexilant 60 mg in the morning.  Avoid all caffeine products really switch back to a bland reflux diet.  Avoid all greasy spicy and acidic foods.  We will add Carafate to current regimen to try to calm her stomach down over the next week or 2.      Relevant Medications   sucralfate (CARAFATE) 1 g tablet     Other   Chronic pain of right knee    Chronic right knee pain-no significant crepitus on exam.  Slight increased laxity on the right compared to the left.  Negative McMurray's.  With patellar compression she did have some crepitus and grinding.      Relevant Orders   DG Knee Complete 4 Views Right    Other Visit Diagnoses    Need for immunization against influenza    -  Primary   Relevant Orders   Flu Vaccine QUAD 36+ mos IM (Completed)   Benign paroxysmal positional vertigo, unspecified laterality         Right knee pain-consider patellofemoral syndrome.  We will get  x-rays for further work-up since this is been going on for about 20 years.  If no significant findings may be just some mild arthritis etc. recommend patellofemoral exercises.  If not improving then will get her in with Dr. Darene Lamer for further work-up.  BBPV -getting vestibular rehab through physical therapy.  Meds ordered this encounter  Medications  . sucralfate (CARAFATE) 1 g tablet    Sig: Take 1 tablet (1 g total) by mouth 4 (four)  times daily -  with meals and at bedtime.    Dispense:  120 tablet    Refill:  1    Follow-up: Return if symptoms worsen or fail to improve.    Beatrice Lecher, MD

## 2020-05-22 ENCOUNTER — Other Ambulatory Visit: Payer: Self-pay | Admitting: Family Medicine

## 2020-05-22 NOTE — Telephone Encounter (Signed)
Last RX sent 11/21/19 for #180, 1 po BID with 1 RF   RX pended

## 2020-07-13 ENCOUNTER — Encounter: Payer: Self-pay | Admitting: Family Medicine

## 2020-07-13 ENCOUNTER — Telehealth (INDEPENDENT_AMBULATORY_CARE_PROVIDER_SITE_OTHER): Payer: BC Managed Care – PPO | Admitting: Family Medicine

## 2020-07-13 VITALS — Temp 97.9°F | Wt 153.3 lb

## 2020-07-13 DIAGNOSIS — R059 Cough, unspecified: Secondary | ICD-10-CM | POA: Diagnosis not present

## 2020-07-13 MED ORDER — DOXYCYCLINE HYCLATE 100 MG PO TABS: 100.0000 mg | ORAL_TABLET | Freq: Two times a day (BID) | ORAL | 0 refills | Status: DC

## 2020-07-13 MED ORDER — HYDROCOD POLST-CPM POLST ER 10-8 MG/5ML PO SUER
5.0000 mL | Freq: Every evening | ORAL | 0 refills | Status: DC | PRN
Start: 2020-07-13 — End: 2020-11-27

## 2020-07-13 NOTE — Progress Notes (Signed)
Virtual Visit via Telephone Note  I connected with Orine Yahnke on 07/13/20 at 11:30 AM EST by telephone and verified that I am speaking with the correct person using two identifiers.   I discussed the limitations, risks, security and privacy concerns of performing an evaluation and management service by telephone and the availability of in person appointments. I also discussed with the patient that there may be a patient responsible charge related to this service. The patient expressed understanding and agreed to proceed.  Patient location: at home  Provider loccation: In office   Subjective:    CC: sinus sxs   HPI: She reports that starting on Friday had sudden onset of cough and pain and pressure behind her eyes.  She also had some pressure in her chest with the cough.  No sore throat.  No fevers chills or sweats.  No body aches.  She has not had any typical facial cheek pain like she typically does with a sinus infection.  No nausea diarrhea or change in stools.  She did go to [facility] and get a Covid swab yesterday she still awaiting the results.  She is actually supposed to fly out on Wednesday to visit home in [place].   Past medical history, Surgical history, Family history not pertinant except as noted below, Social history, Allergies, and medications have been entered into the medical record, reviewed, and corrections made.   Review of Systems: No fevers, chills, night sweats, weight loss, chest pain, or shortness of breath.   Objective:    General: Speaking clearly in complete sentences without any shortness of breath.  Alert and oriented x3.  Normal judgment. No apparent acute distress.    Impression and Recommendations:    Cough-suspect viral illness.  Hopefully her Covid test will come back negative so that she can travel.  I did go ahead and give her a prescription for antibiotic and cough syrup to take with her in case she feels like she is not improving over this next  week or if she suddenly feels like she is getting worse.  Discussed symptomatic care.    I discussed the assessment and treatment plan with the patient. The patient was provided an opportunity to ask questions and all were answered. The patient agreed with the plan and demonstrated an understanding of the instructions.   The patient was advised to call back or seek an in-person evaluation if the symptoms worsen or if the condition fails to improve as anticipated.  I provided 20 minutes of non-face-to-face time during this encounter.   Beatrice Lecher, MD

## 2020-07-13 NOTE — Progress Notes (Signed)
sxs x 3 days. She stated that by Friday her sxs became worse. She started taking mucinex today.  Denies f/s/c/n/v/d/or body aches.   She reports that she has pain in both ears, headache is behind her eyes and forehead.

## 2020-07-15 ENCOUNTER — Telehealth: Payer: Self-pay

## 2020-07-15 NOTE — Telephone Encounter (Signed)
LVM advising pt that only an ABX and tussionex was sent to her pharmacy. There was no mention of a steroid

## 2020-07-15 NOTE — Telephone Encounter (Signed)
Faria called and left a message wanting a call back from [place] about getting prednisone and a non-narcotic cough medication.

## 2020-07-19 ENCOUNTER — Other Ambulatory Visit: Payer: Self-pay | Admitting: Family Medicine

## 2020-07-19 DIAGNOSIS — F5101 Primary insomnia: Secondary | ICD-10-CM

## 2020-08-10 ENCOUNTER — Other Ambulatory Visit: Payer: Self-pay | Admitting: Family Medicine

## 2020-08-18 ENCOUNTER — Telehealth: Payer: Self-pay

## 2020-08-18 MED ORDER — VENLAFAXINE HCL 75 MG PO TABS: 75.0000 mg | ORAL_TABLET | Freq: Every day | ORAL | 1 refills | Status: DC

## 2020-08-18 NOTE — Telephone Encounter (Signed)
Tara Howard called and reports having dizziness, cough, fatigue and vision changes. Due to the patient having Covid I advised her to go to the ED ASAP.

## 2020-08-18 NOTE — Telephone Encounter (Signed)
Agree with documentation as above.   Meko Bellanger, MD  

## 2020-09-08 ENCOUNTER — Other Ambulatory Visit: Payer: Self-pay

## 2020-09-08 ENCOUNTER — Ambulatory Visit (INDEPENDENT_AMBULATORY_CARE_PROVIDER_SITE_OTHER): Payer: Self-pay | Admitting: Sports Medicine

## 2020-09-08 DIAGNOSIS — M1812 Unilateral primary osteoarthritis of first carpometacarpal joint, left hand: Secondary | ICD-10-CM | POA: Insufficient documentation

## 2020-09-08 MED ORDER — ACETAMINOPHEN ER 650 MG PO TBCR: 650.0000 mg | EXTENDED_RELEASE_TABLET | Freq: Two times a day (BID) | ORAL | 3 refills | Status: DC

## 2020-09-08 NOTE — Assessment & Plan Note (Addendum)
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 twice daily. Adding thumb conditioning exercises, thumb spica brace for 2 weeks, return to see me in a month, thumb basal joint injection if no better. Unable to use NSAIDs due to severe GERD, Celebrex also was not effective and created excessive side effects. Also currently using tramadol already.

## 2020-09-08 NOTE — Progress Notes (Signed)
    Procedures performed today:    None.  Independent interpretation of notes and tests performed by another provider:   None.  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 twice daily. Adding thumb conditioning exercises, thumb spica brace for 2 weeks, return to see me in a month, thumb basal joint injection if no better. Unable to use NSAIDs due to severe GERD, Celebrex also was not effective and created excessive side effects. Also currently using tramadol already.    ___________________________________________ Gwen Her. Dianah Field, M.D., ABFM., CAQSM. Primary Care and [place] In[employer] of [place] [facility]

## 2020-10-07 ENCOUNTER — Ambulatory Visit: Payer: Medicaid Other | Admitting: Sports Medicine

## 2020-10-23 ENCOUNTER — Telehealth: Payer: Self-pay

## 2020-10-23 ENCOUNTER — Other Ambulatory Visit: Payer: Self-pay | Admitting: *Deleted

## 2020-10-23 DIAGNOSIS — N951 Menopausal and female climacteric states: Secondary | ICD-10-CM

## 2020-10-23 MED ORDER — GABAPENTIN 100 MG PO CAPS: 100.0000 mg | ORAL_CAPSULE | Freq: Every day | ORAL | 3 refills | Status: DC

## 2020-10-23 NOTE — Telephone Encounter (Signed)
She is on Lyrica.  Does she want to change back to Gabapentin? They are in the same family

## 2020-10-23 NOTE — Telephone Encounter (Signed)
She can't take both.  We can adjust her dose on the lyrica or we can change to gaba

## 2020-10-23 NOTE — Telephone Encounter (Signed)
Pt called and asked for a refill on her gabapentin. Please advise, thanks.

## 2020-10-23 NOTE — Telephone Encounter (Signed)
Informed pt that medications where in the same family. Pt stated she wants to take both Lyrica and Gabapentin. Pt states she is having night sweats and the Gabapentin helps.

## 2020-10-27 ENCOUNTER — Other Ambulatory Visit: Payer: Self-pay | Admitting: *Deleted

## 2020-10-27 MED ORDER — QVAR REDIHALER 80 MCG/ACT IN AERB: 2.0000 | INHALATION_SPRAY | Freq: Two times a day (BID) | RESPIRATORY_TRACT | 99 refills | Status: DC

## 2020-10-27 NOTE — Progress Notes (Signed)
Pt called and informed that medication was sent.

## 2020-11-08 ENCOUNTER — Other Ambulatory Visit: Payer: Self-pay | Admitting: Family Medicine

## 2020-11-08 DIAGNOSIS — F5101 Primary insomnia: Secondary | ICD-10-CM

## 2020-11-27 ENCOUNTER — Other Ambulatory Visit: Payer: Self-pay | Admitting: *Deleted

## 2020-11-27 MED ORDER — PREGABALIN 200 MG PO CAPS: 200.0000 mg | ORAL_CAPSULE | Freq: Two times a day (BID) | ORAL | 0 refills | Status: DC

## 2020-12-09 ENCOUNTER — Telehealth: Payer: Self-pay | Admitting: *Deleted

## 2020-12-09 NOTE — Telephone Encounter (Signed)
Pt advised and scheduled w/[facility]

## 2020-12-09 NOTE — Telephone Encounter (Signed)
OK to schedule in AM with another provider.  Or can do online e-visit.

## 2020-12-09 NOTE — Telephone Encounter (Signed)
Pt called and informed me that she has been blowing out greenish mucus since Monday. She denies any f/s/c/n/v/d body or headaches no facial or ear pain. She has some post nasal drip and slight sore throat. Denies any sick contacts. She does have allergies and has been taking flonase and zyrtec. Pt wanted to know if there was anything that she could take OTC for her sxs.  She said that they actually began about 1.5-2 weeks ago. Advised to also try mucinex and hydrate.   She asked if this could be turning into an infection? She has an event on Saturday and doesn't want to miss this.     She had an infusion done yesterday in her neck for arthritis (fyi)  * no openings on any providers schedules for visit  Will fwd to pcp for advice.

## 2020-12-10 ENCOUNTER — Telehealth (INDEPENDENT_AMBULATORY_CARE_PROVIDER_SITE_OTHER): Payer: BC Managed Care – PPO | Admitting: Family Medicine

## 2020-12-10 ENCOUNTER — Encounter: Payer: Self-pay | Admitting: Family Medicine

## 2020-12-10 DIAGNOSIS — J0100 Acute maxillary sinusitis, unspecified: Secondary | ICD-10-CM | POA: Diagnosis not present

## 2020-12-10 MED ORDER — PREDNISONE 20 MG PO TABS: 40.0000 mg | ORAL_TABLET | Freq: Every day | ORAL | 0 refills | Status: AC

## 2020-12-10 MED ORDER — DOXYCYCLINE HYCLATE 100 MG PO TABS: 100.0000 mg | ORAL_TABLET | Freq: Two times a day (BID) | ORAL | 0 refills | Status: AC

## 2020-12-10 NOTE — Progress Notes (Signed)
Virtual Video Visit via MyChart Note  I connected with  Tara Howard on 12/10/20 at  8:10 AM EDT by the video enabled telemedicine application for MyChart, and verified that I am speaking with the correct person using two identifiers.   I introduced myself as a Designer, jewellery with the practice. We discussed the limitations of evaluation and management by telemedicine and the availability of in person appointments. The patient expressed understanding and agreed to proceed.  Participating parties in this visit include: The patient and the nurse practitioner listed.  The patient is: At work I am: In the office - [facility]  Subjective:    CC:  Chief Complaint  Patient presents with  . Sinusitis  . URI    HPI: Tara Howard is a 50 y.o. year old female presenting today via [facility] today for URI/sinusitis symptoms.  Patient reports she has had allergy type symptoms with rhinorrhea for the past month or 2.  However on Monday symptoms worsened significantly and she now reports copious amounts of green mucus from her nose, maxillary sinus pressure 6-7/10, occasional headaches 7/10, bilateral ear ache, left cervical lymph node tenderness, cough worse in the morning with mild shortness of breath causing her to use her albuterol inhaler at least once per day for the past week.  She does report some occasional diarrhea over the past few days but thinks it is related to eating a poor diet.  The postnasal drip is causing a sore throat.  She has not had any nausea, vomiting, chest pain, fevers, wheezing, loss of taste/smell.  She has been taking Zyrtec and Flonase.  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  She has not yet tested for COVID.  She is vaccinated against COVID.  No recent antibiotic use.    Past medical history, Surgical history, Family history not pertinant except as noted below, Social history, Allergies, and  medications have been entered into the medical record, reviewed, and corrections made.   Review of Systems:  All review of systems negative except what is listed in the HPI   Objective:    General:  Speaking clearly in complete sentences. Absent shortness of breath noted.   Alert and oriented x3.   Normal judgment.  Absent acute distress.   Impression and Recommendations:    1. Acute non-recurrent maxillary sinusitis Still fairly early in course, so this could be viral.  We will go ahead and give a prednisone burst and recommend she continue supportive therapies including rest, hydration, Mucinex, Flonase, Zyrtec, over-the-counter cough cold and analgesics, humidifier use, warm compresses, warm liquids with honey.  If no improvement after 2-3 more days of conservative treatment, or if symptoms worsen prior, go ahead and begin doxycycline.  Patient was educated on viral versus bacterial infection and antibiotic stewardship.  Patient aware of signs and symptoms requiring further evaluation.  Recommend she COVID test -states she has access to one at work.  She is agreeable to plan.  - doxycycline (VIBRA-TABS) 100 MG tablet; Take 1 tablet (100 mg total) by mouth 2 (two) times daily for 5 days.  Dispense: 10 tablet; Refill: 0 - predniSONE (DELTASONE) 20 MG tablet; Take 2 tablets (40 mg total) by mouth daily with breakfast for 5 days.  Dispense: 10 tablet; Refill: 0   Follow-up if symptoms worsen or fail to improve.    I discussed the assessment and treatment plan with the patient. The patient was provided an opportunity to  ask questions and all were answered. The patient agreed with the plan and demonstrated an understanding of the instructions.   The patient was advised to call back or seek an in-person evaluation if the symptoms worsen or if the condition fails to improve as anticipated.  I provided 20 minutes of non-face-to-face interaction with this [place] visit including intake,  same-day documentation, and chart review.   Terrilyn Saver, NP

## 2020-12-10 NOTE — Patient Instructions (Signed)
I recommend you take a COVID test and then try at least 2-3 more days of conservative treatment using options listed below.  If COVID-negative and symptoms do not improve over the next 2 to 3 days can go ahead and start antibiotic.  Hope you feel better soon!  Over the counter medications that may be helpful for symptoms:  . Guaifenesin 1200 mg extended release tabs twice daily, with plenty of water o For cough and congestion o Brand name: Mucinex   . Pseudoephedrine 30 mg, one or two tabs every 4 to 6 hours o For sinus congestion o Brand name: Sudafed o You must get this from the pharmacy counter.  . Oxymetazoline nasal spray each morning, one spray in each nostril, for NO MORE THAN 3 days  o For nasal and sinus congestion o Brand name: Afrin . Saline nasal spray or Saline Nasal Irrigation 3-5 times a day o For nasal and sinus congestion o Brand names: [place]ood or AYR . Fluticasone nasal spray, one spray in each nostril, each morning (after oxymetazoline and saline, if used) o For nasal and sinus congestion o Brand name: Flonase . Warm salt water gargles  o For sore throat o Every few hours as needed . Alternate ibuprofen 400-600 mg and acetaminophen 1000 mg every 4-6 hours o For fever, body aches, headache o Brand names: Motrin or Advil and Tylenol . Dextromethorphan 12-hour cough version 30 mg every 12 hours  o For cough o Brand name: Delsym Stop all other cold medications for now (Nyquil, Dayquil, Tylenol Cold, Theraflu, etc) and other non-prescription cough/cold preparations. Many of these have the same ingredients listed above and could cause an overdose of medication.   Herbal treatments that have been shown to be helpful in some patients include: Vitamin C 1000mg  per day Vitamin D 4000iU per day Zinc 100mg  per day Quercetin 25-500mg  twice a day Melatonin 5-10mg  at bedtime  General Instructions . Allow your body to rest . Drink PLENTY of fluids . Isolate yourself from  everyone, even family, until test results have returned  If your COVID-19 test is positive . Then you ARE INFECTED and you can pass the virus to others . You must quarantine from others for a minimum of  o 10 days since symptoms started AND o You are fever free for 24 hours WITHOUT any medication to reduce fever AND o Your symptoms are improving . Do not go to the store or other public areas . Do not go around household members who are not known to be infected with COVID-19 . If you MUST leave your area of quarantine (example: go to a bathroom you share with others in your home), you must o Wear a mask o Wash your hands thoroughly o Wipe down any surfaces you touch . Do not share food, drinks, towels, or other items with other persons . Dispose of your own tissues, food containers, etc  Once you have recovered, please continue good preventive care measures, including:  . wearing a mask when in public . wash your hands frequently . avoid touching your face/nose/eyes . cover coughs/sneezes with the inside of your elbow . stay out of crowds . keep a 6 foot distance from others  If you develop severe shortness of breath, uncontrolled fevers, coughing up blood, confusion, chest pain, or signs of dehydration (such as significantly decreased urine amounts or dizziness with standing) please go to the ER.

## 2021-01-02 ENCOUNTER — Other Ambulatory Visit: Payer: Self-pay | Admitting: Family Medicine

## 2021-01-07 ENCOUNTER — Encounter: Payer: Self-pay | Admitting: Physician Assistant

## 2021-01-07 ENCOUNTER — Ambulatory Visit (INDEPENDENT_AMBULATORY_CARE_PROVIDER_SITE_OTHER): Payer: BC Managed Care – PPO | Admitting: Physician Assistant

## 2021-01-07 ENCOUNTER — Other Ambulatory Visit: Payer: Self-pay

## 2021-01-07 VITALS — BP 121/78 | HR 95 | Temp 98.6°F | Wt 144.0 lb

## 2021-01-07 DIAGNOSIS — N760 Acute vaginitis: Secondary | ICD-10-CM | POA: Diagnosis not present

## 2021-01-07 DIAGNOSIS — N898 Other specified noninflammatory disorders of vagina: Secondary | ICD-10-CM

## 2021-01-07 LAB — WET PREP FOR TRICH, YEAST, CLUE
MICRO NUMBER:: 12015757
Specimen Quality: ADEQUATE

## 2021-01-07 MED ORDER — VALACYCLOVIR HCL 1 G PO TABS
1000.0000 mg | ORAL_TABLET | Freq: Two times a day (BID) | ORAL | 0 refills | Status: DC
Start: 2021-01-07 — End: 2021-01-12

## 2021-01-07 MED ORDER — FLUCONAZOLE 150 MG PO TABS: ORAL_TABLET | ORAL | 0 refills | Status: DC

## 2021-01-07 MED ORDER — NYSTATIN 100000 UNIT/ML MT SUSP: 5.0000 mL | Freq: Four times a day (QID) | OROMUCOSAL | 0 refills | Status: DC

## 2021-01-07 NOTE — Patient Instructions (Signed)
Vaginitis  Vaginitis is a condition in which the vaginal tissue swells and becomes irritated. This condition is most often caused by a change in the normal balance of bacteria and yeast that live in the vagina. This change causes an overgrowth of certain bacteria or yeast, which causes the inflammation. Thereare different types of vaginitis. What are the causes? The cause of this condition depends on the type of vaginitis. It can be caused by: Bacteria (bacterial vaginosis). Yeast, which is a fungus (candidiasis). A parasite (trichomoniasis vaginitis). A virus (viral vaginitis). Low hormone levels (atrophic vaginitis). Low hormone levels can occur during pregnancy, breastfeeding, or after menopause. Irritants, such as bubble baths, scented tampons, and feminine sprays (allergic vaginitis). Other factors can change the normal balance of the yeast and bacteria that live in the vagina. These include: Antibiotic medicines. Poor hygiene. Diaphragms, vaginal sponges, spermicides, birth control pills, and intrauterine devices (IUDs). Sex. Infection. Uncontrolled diabetes. A weakened body defense system (immune system). What increases the risk? This condition is more likely to develop in women who: Smoke or are exposed to secondhand smoke. Use vaginal douches, scented tampons, or scented sanitary pads. Wear tight-fitting pants or thong underwear. Use oral birth control pills or an IUD. Have sex without a condom or have multiple partners. Have an STI. Frequently use the spermicide nonoxynol-9. Eat lots of foods high in sugar or who have uncontrolled diabetes. Have low estrogen levels. Have a weakened immune system from an immune disorder or medical treatment. Are pregnant or breastfeeding. What are the signs or symptoms? Symptoms vary depending on the cause of the vaginitis. Common symptoms include: Abnormal vaginal discharge. The discharge is white, gray, or yellow with bacterial  vaginosis. The discharge is thick, white, and cheesy with a yeast infection. The discharge is frothy and yellow or greenish with trichomoniasis. A bad vaginal smell. The smell is fishy with bacterial vaginosis. Vaginal itching, pain, or swelling. Pain with sex. Pain or burning when urinating. Sometimes there are no symptoms. How is this diagnosed? This condition is diagnosed based on your symptoms and medical history. A physical exam, including a pelvic exam, will also be done. You may also have other tests, including: Tests to determine the pH level (acidity or alkalinity) of your vagina. A whiff test to assess the odor that results when a sample of your vaginal discharge is mixed with a potassium hydroxide solution. Tests of vaginal fluid. A sample will be examined under a microscope. How is this treated? Treatment varies depending on the type of vaginitis you have. Your treatment may include: Antibiotic creams or pills to treat bacterial vaginosis and trichomoniasis. Antifungal medicines, such as vaginal creams or suppositories, to treat a yeast infection. Medicine to ease discomfort if you have viral vaginitis. Your sexual partner should also be treated. Estrogen delivered in a cream, pill, suppository, or vaginal ring to treat atrophic vaginitis. If vaginal dryness occurs, lubricants and moisturizing creams may help. You may need to avoid scented soaps, sprays, or douches. Stopping use of a product that is causing allergic vaginitis and then using a vaginal cream to treat the symptoms. Follow these instructions at home: Lifestyle Keep your genital area clean and dry. Avoid soap, and only rinse the area with water. Do not douche or use tampons until your health care provider says it is okay. Use sanitary pads, if needed. Do not have sex until your health care provider approves. When you can return to sex, practice safe sex and use condoms. Wipe from front to  back. This avoids the spread  of bacteria from the rectum to the vagina. General instructions Take over-the-counter and prescription medicines only as told by your health care provider. If you were prescribed an antibiotic medicine, take or use it as told by your health care provider. Do not stop taking or using the antibiotic even if you start to feel better. Keep all follow-up visits. This is important. How is this prevented? Use mild, unscented products. Do not use things that can irritate the vagina, such as fabric softeners. Avoid the following products if they are scented: Feminine sprays. Detergents. Tampons. Feminine hygiene products. Soaps or bubble baths. Let air reach your genital area. To do this: Wear cotton underwear to reduce moisture buildup. Avoid wearing underwear while you sleep. Avoid wearing tight pants and underwear or nylons without a cotton panel. Avoid wearing thong underwear. Take off any wet clothing, such as bathing suits, as soon as possible. Practice safe sex and use condoms. Contact a health care provider if: You have abdominal or pelvic pain. You have a fever or chills. You have symptoms that last for more than 2-3 days. Get help right away if: You have a fever and your symptoms suddenly get worse. Summary Vaginitis is a condition in which the vaginal tissue becomes inflamed.This condition is most often caused by a change in the normal balance of bacteria and yeast that live in the vagina. Treatment varies depending on the type of vaginitis you have. Do not douche, use tampons, or have sex until your health care provider approves. When you can return to sex, practice safe sex and use condoms. This information is not intended to replace advice given to you by your health care provider. Make sure you discuss any questions you have with your healthcare provider. Document Revised: 01/09/2020 Document Reviewed: 01/09/2020 Elsevier Patient Education  2022 [employer]  Sexually Transmitted Infections, Adult Sexually transmitted infections (STIs) are diseases that are spread from person to person (are contagious). They are spread, or transmitted, through bodily fluids exchanged during sex or sexual contact. These bodily fluids include saliva, semen, blood, vaginal mucus, and urine. STIs are verycommon among people of all ages. Some common STIs include: Herpes. Hepatitis B. Chlamydia. Gonorrhea. Syphilis. HPV (human papillomavirus). HIV, also called the human immunodeficiency virus. This is the virus that can cause AIDS (acquired immunodeficiency syndrome). Often, people who have these STIs do not have symptoms. Even without symptoms,these infections can be spread from person to person and require treatment. How can these conditions affect me? STIs can be treated, and many STIs can be cured. However, some STIs cannot becured and will affect you for the rest of your life. Certain STIs may: Require you to take medicine for the rest of your life. Affect your ability to have children (your fertility). Increase your risk for developing another STI or certain serious health conditions. These may include: Cervical cancer. Head and neck cancer. Pelvic inflammatory disease (PID), in women. Organ damage or damage to other parts of your body, if the infection spreads. Cause problems during pregnancy and may be transmitted to the baby during the pregnancy or childbirth. What can increase my risk? You may have an increased risk for developing an STI if: You have unprotected sex. Sex includes oral, vaginal, or anal sex. You have more than one sex partner. You have a sex partner who has multiple sex partners. You have sex with someone who has an STI. You have an STI or you had an  STI before. You inject drugs or have a sex partner or partners who inject drugs. What actions can I take to prevent STIs? The only way to completely prevent STIs is not to have sex of any  kind. This is called practicing abstinence. If you are sexually active, you can protect yourself and others by taking these actions to lower your risk of getting anSTI: Lifestyle Avoid mixing alcohol, drugs, and sex. Alcohol and drug use can affect yourability to make good decisions and can lead to risky sexual behaviors. Medicines Ask your health care provider about taking pre-exposure prophylaxis (PrEP) toprevent HIV infection. General information  Stay up to date on vaccinations. Certain vaccines can lower your risk of getting certain STIs, such as: Hepatitis A and hepatitis B vaccines. You may have been vaccinated as a young child, but you will likely need a booster shot as a teen or young adult. HPV (human papillomavirus) vaccine. Have only one sex partner (be monogamous) or limit the number of sex partners you have. Use methods that prevent the exchange of body fluids between partners (barrier protection) correctly every time you have sex. Barrier protection can be used during oral, vaginal, or anal sex. Commonly used barrier methods include: Female condom. Female condom. Dental dam. Use a new condom for every sex act from start to finish. Get tested for STIs. Have your partners get tested, too. If you test positive for an STI, follow recommendations from your health care provider about treatment and make sure your sex partners are tested and treated. Birth control pills, injections, implants, and intrauterine devices (IUDs) do not protect against STIs. To prevent both STIs and pregnancy, always use a condom with another form of birth control. Some STIs, such as herpes, are spread through skin-to-skin contact. A condom may not protect you from getting such STIs. Avoid all sexual contact if you or your partners have herpes and there is an active flare with open sores.  Where to find more information Learn more about STIs from: Centers for Disease Control and Prevention: More information  about specific STIs: https://price.info/ Places to get sexual health counseling and treatment for free or at a low cost: gettested.StoreMirror.com.cy U.S. Department of Health and Human Services: [place]s.tn Summary Sexually transmitted infections (STIs) can spread through exchanging bodily fluids during sexual contact. Fluids include saliva, semen, blood, vaginal mucus, and urine. You may have an increased risk for developing an STI if you have unprotected sex. Sex includes oral, vaginal, or anal sex. If you do have sex, limit your number of sex partners and use barrier protection every time you have sex. This information is not intended to replace advice given to you by your health care provider. Make sure you discuss any questions you have with your healthcare provider. Document Revised: 08/26/2019 Document Reviewed: 08/26/2019 Elsevier Patient Education  2022 [employer].

## 2021-01-07 NOTE — Progress Notes (Signed)
Acute Office Visit  Subjective:    Patient ID: Tara Howard, female    DOB: Aug 30, 1970, 50 y.o.   MRN: [id number]  No chief complaint on file.   HPI Patient is in today for vaginal/vulvar pruritus and redness x 3 days and painful white tongue lesions x 2 days Symptoms began Monday States tongue has felt bruised but she is not having any difficulty eating/drinking - reports chronic dry mouth due to medications and has been using Biotene rinse more recently She reports new female sexual partner on Sunday and had unprotected vaginal and oral sex - has vaginismus so typically has pain with initial penetration but denies post-coital bleeding Reports vulvovaginal area felt sore day after sex She denies fever, fatigue, malaise, sore throat, LN She has no prior hx of STI - reports partner was tested recently She uses Qvar inhaler daily for asthma and always rinses and brushes tongue after use Denies constitutional symptoms Denies hx of immunosuppression, diabetes or recurrent candidal infections   Past Medical History:  Diagnosis Date   Allergy    allergist in [place]   Anxiety    Asthma    Breast cancer ([place])    Chronic headache    Depression    Fibromyalgia    Fibromyalgia 2006   pain / headaches   GERD (gastroesophageal reflux disease)    IBS (irritable bowel syndrome)    Osteoarthritis    in her hips   Vaginismus     Past Surgical History:  Procedure Laterality Date   bilateral mastectomy  11/01/2018   BRAVO [place] STUDY N/A 03/26/2019   Procedure: BRAVO [place] STUDY;  Surgeon: Lavena Bullion, DO;  Location: [facility];  Service: Gastroenterology;  Laterality: N/A;   BRAVO [place] STUDY N/A 06/27/2019   Procedure: BRAVO [place];  Surgeon: Lavena Bullion, DO;  Location: [facility];  Service: Gastroenterology;  Laterality: N/A;   BREAST LUMPECTOMY Left 09/2018   BREAST RECONSTRUCTION  12/2018   CESAREAN SECTION     X 2   ENDOMETRIAL ABLATION     ESOPHAGOGASTRODUODENOSCOPY      said it was done maybe about 10 years ago. In [place]   ESOPHAGOGASTRODUODENOSCOPY (EGD) WITH PROPOFOL N/A 03/26/2019   Procedure: ESOPHAGOGASTRODUODENOSCOPY (EGD) WITH PROPOFOL;  Surgeon: Lavena Bullion, DO;  Location: [facility];  Service: Gastroenterology;  Laterality: N/A;   ESOPHAGOGASTRODUODENOSCOPY (EGD) WITH PROPOFOL N/A 06/27/2019   Procedure: ESOPHAGOGASTRODUODENOSCOPY (EGD) WITH PROPOFOL;  Surgeon: Lavena Bullion, DO;  Location: [facility];  Service: Gastroenterology;  Laterality: N/A;   TONSILLECTOMY     TONSILLECTOMY     TUBAL LIGATION     UPPER GASTROINTESTINAL ENDOSCOPY      Family History  Problem Relation Age of Onset   CAD Father        3 vessel bypass   Depression Mother    Hypertension Mother    Heart attack Mother    CAD Mother        3 vessel bypass   Irritable bowel syndrome Mother    Aneurysm Mother        brain   Cancer Paternal Grandmother        of the bowel per patient    Esophageal cancer Paternal Grandmother     Social History   Socioeconomic History   Marital status: Legally Separated    Spouse name: Not on file   Number of children: 2   Years of education: Not on file   Highest education level: Not on  file  Occupational History   Occupation: Wellsite geologist: [place]    Comment: [employer]  Tobacco Use   Smoking status: Never   Smokeless tobacco: Never  Vaping Use   Vaping Use: Never used  Substance and Sexual Activity   Alcohol use: Yes    Comment: rarely    Drug use: Never   Sexual activity: Not Currently    Birth control/protection: Surgical  Other Topics Concern   Not on file  Social History Narrative   Not on file   Social Determinants of Health   Financial Resource Strain: Not on file  Food Insecurity: Not on file  Transportation Needs: Not on file  Physical Activity: Not on file  Stress: Not on file  Social Connections: Not on file  Intimate Partner Violence: Not  on file    Outpatient Medications Prior to Visit  Medication Sig Dispense Refill   acetaminophen (TYLENOL) 650 MG CR tablet Take 1-2 tablets (650-1,300 mg total) by mouth in the morning and at bedtime. 90 tablet 3   Cholecalciferol (DIALYVITE VITAMIN D 5000) 125 MCG (5000 UT) capsule Take 5,000 Units by mouth daily.     dexlansoprazole (DEXILANT) 60 MG capsule TAKE 1 CAPSULE BY MOUTH EVERY DAY 30 capsule 1   fluticasone (FLONASE) 50 MCG/ACT nasal spray Place 1 spray into both nostrils daily.     gabapentin (NEURONTIN) 100 MG capsule Take 1-3 capsules (100-300 mg total) by mouth at bedtime. 90 capsule 3   levalbuterol (XOPENEX HFA) 45 MCG/ACT inhaler Inhale 2 puffs into the lungs every 6 (six) hours as needed for wheezing or shortness of breath. Pt will call when needs to filled. 15 g 1   Multiple Vitamins-Minerals (HAIR SKIN AND NAILS FORMULA PO) Take 2 tablets by mouth daily.     polyethylene glycol (MIRALAX / GLYCOLAX) 17 g packet Take 17 g by mouth 2 (two) times daily.      pregabalin (LYRICA) 200 MG capsule Take 1 capsule (200 mg total) by mouth 2 (two) times daily. 180 capsule 0   QULIPTA 60 MG TABS Take 1 tablet by mouth daily.  11   QVAR REDIHALER 80 MCG/ACT inhaler Inhale 2 puffs into the lungs 2 (two) times daily. 10.6 g prn   tamoxifen (NOLVADEX) 20 MG tablet Take 20 mg by mouth at bedtime.     traZODone (DESYREL) 100 MG tablet TAKE 1 & 1/2 TABLETS (150MG  TOTAL) BY MOUTH AT BEDTIME 135 tablet 1   venlafaxine (EFFEXOR) 75 MG tablet Take 1 tablet (75 mg total) by mouth daily. 90 tablet 1   No facility-administered medications prior to visit.    Allergies  Allergen Reactions   Azithromycin Diarrhea, Nausea And Vomiting and Other (See Comments)        Hycodan [Hydrocodone Bit-Homatrop Mbr] Other (See Comments)    Nausea   Augmentin [Amoxicillin-Pot Clavulanate] Diarrhea, Nausea And Vomiting and Other (See Comments)    Stomach cramps Did it involve swelling of the  face/tongue/throat, SOB, or low BP? No Did it involve sudden or severe rash/hives, skin peeling, or any reaction on the inside of your mouth or nose? No Did you need to seek medical attention at a hospital or doctor's office? No When did it last happen? More than 3 years ago    If all above answers are "NO", may proceed with cephalosporin use.    Clonidine Derivatives Rash   Flexeril [Cyclobenzaprine] Other (See Comments)    constipation   Linzess [  Linaclotide] Other (See Comments)    cramping   Oxybutynin Rash   Zolpidem Tartrate Other (See Comments)    Hallucinations    Review of Systems     Objective:    Physical Exam Exam conducted with a chaperone present Malva Limes, CMA).  Genitourinary:    General: Normal vulva.     Exam position: Lithotomy position.     Pubic Area: No rash.      Labia:        Right: Tenderness and lesion (tiny red papules with whitish patches of the inner labia minora at the introitus bilaterally, no friability) present.        Left: Tenderness and lesion present.   Lymphadenopathy:     Lower Body: No right inguinal adenopathy. No left inguinal adenopathy.    BP 121/78   Pulse 95   Temp 98.6 F (37 C)   Wt 144 lb (65.3 kg)   SpO2 100%   BMI 24.72 kg/m   Health Maintenance Due  Topic Date Due   Pneumococcal Vaccine 81-49 Years old (1 - PCV) Never done   Hepatitis C Screening  Never done   COVID-19 Vaccine (3 - Pfizer risk series) 11/19/2019    There are no preventive care reminders to display for this patient.   Lab Results  Component Value Date   TSH 1.235 10/09/2014   Lab Results  Component Value Date   WBC 5.3 10/09/2014   HGB 13.8 10/09/2014   HCT 41.7 10/09/2014   MCV 94.1 10/09/2014   PLT 249 10/09/2014   Lab Results  Component Value Date   NA 140 08/08/2016   NA 140 08/08/2016   K 4.3 08/08/2016   K 4.3 08/08/2016   CO2 27 10/09/2014   GLUCOSE 84 10/09/2014   BUN 12 08/08/2016   BUN 18 08/08/2016   CREATININE  0.7 08/08/2016   CREATININE 0.7 08/08/2016   BILITOT 0.5 10/09/2014   ALKPHOS 51 08/08/2016   AST 15 08/08/2016   AST 15 08/08/2016   ALT 13 08/08/2016   ALT 13 08/08/2016   PROT 6.4 10/09/2014   ALBUMIN 4.3 10/09/2014   CALCIUM 9.2 10/09/2014   Lab Results  Component Value Date   CHOL 146 08/08/2016   CHOL 146 08/08/2016   Lab Results  Component Value Date   HDL 48 08/08/2016   HDL 48 08/08/2016   Lab Results  Component Value Date   LDLCALC 85 08/08/2016   LDLCALC 85 08/08/2016   Lab Results  Component Value Date   TRIG 65 08/08/2016   TRIG 65 08/08/2016   No results found for: CHOLHDL No results found for: HGBA1C     Assessment & Plan:   Problem List Items Addressed This Visit   None Visit Diagnoses     Vaginal discharge    -  Primary   Relevant Orders   WET PREP FOR [place], YEAST, CLUE   Acute vaginitis       Relevant Medications   fluconazole (DIFLUCAN) 150 MG tablet   nystatin (MYCOSTATIN) 100000 UNIT/ML suspension   valACYclovir (VALTREX) 1000 MG tablet   Other Relevant Orders   Herpes simplex virus culture      Wet mount pending HSV culture from labia minor pending Treating empirically for oral and vulvovaginal candidiasis with Fluconazole 150 mg x 2 doses, 72 hr apart and Nystatin oral rinse after Qvar use Will cover for HSV with Valtrex 1G bid up to 10 days pending viral culture Recommend patient have comprehensive  STI screening in approx 3 weeks (too early to screen for GC/CT or HIV as it has been <1 week since unprotected sex) - she plans to have this done at upcoming Pap smear appt with GYN - recommend she have HPV co-testing with Pap Counseled patient on condom use   Meds ordered this encounter  Medications   fluconazole (DIFLUCAN) 150 MG tablet    Sig: 1 tab by mouth today; repeat 1 tab by mouth in 3 days    Dispense:  2 tablet    Refill:  0    Order Specific Question:   Supervising Provider    Answer:   Emeterio Reeve [[id number]]    nystatin (MYCOSTATIN) 100000 UNIT/ML suspension    Sig: Take 5 mLs (500,000 Units total) by mouth 4 (four) times daily.    Dispense:  60 mL    Refill:  0    Order Specific Question:   Supervising Provider    Answer:   Emeterio Reeve [[id number]]   valACYclovir (VALTREX) 1000 MG tablet    Sig: Take 1 tablet (1,000 mg total) by mouth 2 (two) times daily for 10 days.    Dispense:  20 tablet    Refill:  0    Order Specific Question:   Supervising Provider    Answer:   Emeterio Reeve [[id number]]     Trixie Dredge, PA-C

## 2021-01-11 ENCOUNTER — Telehealth: Payer: Self-pay | Admitting: Family Medicine

## 2021-01-11 DIAGNOSIS — N760 Acute vaginitis: Secondary | ICD-10-CM

## 2021-01-11 MED ORDER — FLUCONAZOLE 150 MG PO TABS: ORAL_TABLET | ORAL | 0 refills | Status: AC

## 2021-01-11 NOTE — Telephone Encounter (Signed)
Pt called. Yeast infection has not cleared up. She has completed the doses.  Thank you.

## 2021-01-11 NOTE — Telephone Encounter (Signed)
Patient made aware. She will call back if this doesn't clear.

## 2021-01-12 ENCOUNTER — Telehealth: Payer: Self-pay | Admitting: Family Medicine

## 2021-01-12 ENCOUNTER — Other Ambulatory Visit: Payer: Self-pay | Admitting: Physician Assistant

## 2021-01-12 NOTE — Telephone Encounter (Signed)
Patient left voicemail on scheduling line to get results back. Stated she knows pcp is out of the office but would like a call back to get this back and discuss it. Transferred voicemail to triage so someone can call her back about this.

## 2021-01-12 NOTE — Progress Notes (Signed)
Please let patient know that herpes culture was negative and she can stop the antiviral medication (Valacyclovir/Valtrex)

## 2021-01-12 NOTE — Telephone Encounter (Signed)
Patient advised the lab isn't back at the moment.

## 2021-01-12 NOTE — Progress Notes (Signed)
I spoke with Tara Howard at [facility]. She said that this culture was set up on 01/08/21 and should be resulted by the end of the day today. She said the specimen is currently at the [facility] location and will send a message over to them and let them know we are waiting on the results.

## 2021-01-18 ENCOUNTER — Encounter: Payer: Self-pay | Admitting: Obstetrics & Gynecology

## 2021-01-18 ENCOUNTER — Other Ambulatory Visit: Payer: Self-pay

## 2021-01-18 ENCOUNTER — Ambulatory Visit (INDEPENDENT_AMBULATORY_CARE_PROVIDER_SITE_OTHER): Payer: BC Managed Care – PPO | Admitting: Obstetrics & Gynecology

## 2021-01-18 ENCOUNTER — Other Ambulatory Visit (HOSPITAL_COMMUNITY)
Admission: RE | Admit: 2021-01-18 | Discharge: 2021-01-18 | Disposition: A | Discharge status: Home / Self Care | Payer: BC Managed Care – PPO | Source: Ambulatory Visit | Attending: Obstetrics & Gynecology | Admitting: Obstetrics & Gynecology

## 2021-01-18 VITALS — BP 107/69 | HR 83 | Resp 16 | Ht 64.0 in | Wt 145.0 lb

## 2021-01-18 DIAGNOSIS — Z01419 Encounter for gynecological examination (general) (routine) without abnormal findings: Secondary | ICD-10-CM

## 2021-01-18 DIAGNOSIS — N941 Unspecified dyspareunia: Secondary | ICD-10-CM | POA: Diagnosis not present

## 2021-01-18 DIAGNOSIS — Z113 Encounter for screening for infections with a predominantly sexual mode of transmission: Secondary | ICD-10-CM | POA: Diagnosis not present

## 2021-01-18 NOTE — Progress Notes (Signed)
Requests STI testing

## 2021-01-18 NOTE — Progress Notes (Signed)
Subjective:     Tara Howard is a 50 y.o. female here for a routine exam.  Current complaints: Patient requesting Pap smear.  Her oncologist said she needs her yearly Pap smear.  Patient had intercourse for the first time within the past month.  It was painful after a period of time.  The attempted again this past Saturday and it was very painful and they were unable to have intercourse.  Patient was recently treated for a yeast infection during that episode of painful sexual activity this past Saturday.  He states that they had been arguing that day and that she thinks there was a lack of mind-body connection leading to the pain.  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  This was done at [facility] many years ago.   Gynecologic History No LMP recorded. Patient has had an ablation. Contraception: tubal ligation Last Pap: 2021. Results were: normal Last mammogram: n/a--pt had bilateral mastectomy and was told to follow with physican exams.  Obstetric History OB History  Gravida Para Term Preterm AB Living  3 2 2   1 2   SAB IAB Ectopic Multiple Live Births  1            # Outcome Date GA Lbr Len/2nd Weight Sex Delivery Anes PTL Lv  3 SAB           2 Term           1 Term              The following portions of the patient's history were reviewed and updated as appropriate: allergies, current medications, past family history, past medical history, past social history, past surgical history, and problem list.  Review of Systems Pertinent items noted in HPI and remainder of comprehensive ROS otherwise negative.    Objective:     Vitals:   01/18/21 1348  BP: 107/69  Pulse: 83  Resp: 16  Weight: 145 lb (65.8 kg)  Height: 5\' 4"  (1.626 m)   Vitals:  WNL General appearance: alert, cooperative and no distress  HEENT: Normocephalic, without obvious abnormality, atraumatic Eyes:  negative Throat: lips, mucosa, and tongue normal; teeth and gums normal  Respiratory: Clear to auscultation bilaterally  CV: Regular rate and rhythm  Breasts:  Normal appearance, no masses or tenderness, no nipple retraction or dimpling  GI: Soft, non-tender; bowel sounds normal; no masses,  no organomegaly  GU: External Genitalia:  Tanner V, no lesion Urethra:  No prolapse;   Vagina: Pink, normal rugae, no blood or discharge  Cervix: No CMT, no lesion  Uterus:  Normal size and contour, non tender  Adnexa: Normal, no masses, non tender  Musculoskeletal: No edema, redness or tenderness in the calves or thighs  Skin: No lesions or rash  Lymphatic: Axillary adenopathy: none     Psychiatric: Normal mood and behavior    Assessment:    Healthy female exam.    Plan:    Pap at request of oncologist. Aptima to r/o vaginitis.  No evidence today of vaginitis STD testing per patient.   Offered referral to PT and sexual therapist.  These were declined.  Pt will read the book "Come as You are' to help with sexual concerns. Replens and lubricant at time of intercourse recommended.

## 2021-01-19 LAB — CERVICOVAGINAL ANCILLARY ONLY
Bacterial Vaginitis (gardnerella): NEGATIVE
Candida Glabrata: NEGATIVE
Candida Vaginitis: NEGATIVE
Chlamydia: NEGATIVE
Comment: NEGATIVE
Comment: NEGATIVE
Comment: NEGATIVE
Comment: NEGATIVE
Comment: NEGATIVE
Comment: NORMAL
Neisseria Gonorrhea: NEGATIVE
Trichomonas: NEGATIVE

## 2021-01-19 LAB — CYTOLOGY - PAP
Comment: NEGATIVE
Diagnosis: NEGATIVE
High risk HPV: NEGATIVE

## 2021-01-19 LAB — HIV ANTIBODY (ROUTINE TESTING W REFLEX): HIV 1&2 Ab, 4th Generation: NONREACTIVE

## 2021-02-03 ENCOUNTER — Other Ambulatory Visit: Payer: Self-pay | Admitting: Family Medicine

## 2021-02-03 NOTE — Telephone Encounter (Signed)
Spoke w/pt and informed her that Dexilant is not covered by her insurance and that we could send in Protonix, Prilosec, or Prevacid for her instead. She then stated that none of the alternatives have worked for her and even the Dexilant is not really helping her. She thought that this was something that Tara Howard was writing for her. She said that she had actually done some testing at his office but wasn't able to complete the test due to swallowing issues.   At [place] on 04/14/2020 with PCP the note stated:  She is already on a have 150 mg of Pepcid at bedtime in addition to the Dexilant 60 mg in the morning.  Avoid all caffeine products really switch back to a bland reflux diet.  Avoid all greasy spicy and acidic foods.  We will add Carafate to current regimen to try to calm her stomach down over the next week or 2.  She has not been seen by Tara Howard since 08/12/2019 (telehealth). I am sending a staff message to his office regarding this.

## 2021-02-04 ENCOUNTER — Telehealth: Payer: Self-pay

## 2021-02-04 MED ORDER — PANTOPRAZOLE SODIUM 40 MG PO TBEC: 40.0000 mg | DELAYED_RELEASE_TABLET | Freq: Two times a day (BID) | ORAL | 0 refills | Status: DC

## 2021-02-04 NOTE — Addendum Note (Signed)
Addended by: Yevette Edwards on: 02/04/2021 01:07 PM   Modules accepted: Orders

## 2021-02-04 NOTE — Telephone Encounter (Signed)
Spoke with patient in regards to information below. Patient has been scheduled for next available appt. She is scheduled to see Dr. Bryan Lemma on Thursday, 02/25/21 at 2:20 PM. Next available APP appt is 8/11. Patient is aware that appt will be at the [facility] office. Patient verbalized understanding of information and had no concerns at the end of the call.

## 2021-02-04 NOTE — Telephone Encounter (Signed)
-----   Message from [place], DO sent at 02/04/2021  7:49 AM EDT ----- Regarding: FW: medication management for GERD Please see message below. Can we get her a f/u appt with me or one of the APPs in the office? 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 She hasnt been seen since 07/2019, so ideally needs an OV to evaluate. Does not necessarily need to be with me if there is a sooner opening with one of the APPs. Thanks.   ----- Message ----- From: Teddy Spike, CMA Sent: 02/03/2021   3:00 PM EDT To: Lavena Bullion, DO Subject: medication management for GERD                 We received a refill for Dexilant and this is not covered by her insurance. I contacted the patient and was informed that this is not really controlling her sxs. She stated that she thought that this medication was actually being handled thru your office. I told her that it looked like Dr. Madilyn Fireman refilled this last. I told her that I would reach out to your office to see if there were any other alternatives that you would suggest since the Dexilant,Protonix, Prilosec, or Prevacid are not effective to handle her GERD.

## 2021-02-04 NOTE — Telephone Encounter (Signed)
Pt called back in asking if we can refill the Dexilant although it is documented that it is not covered by her insurance and that it was not effective. Patient states that she will be sick without it. Please advise, thanks.

## 2021-02-04 NOTE — Telephone Encounter (Signed)
Spoke with patient in regards to recommendations. Patient [facility] [address]. Patient had no concerns at the end of the call.

## 2021-02-08 ENCOUNTER — Other Ambulatory Visit: Payer: Self-pay | Admitting: Family Medicine

## 2021-02-08 DIAGNOSIS — F5101 Primary insomnia: Secondary | ICD-10-CM

## 2021-02-12 ENCOUNTER — Other Ambulatory Visit: Payer: Self-pay | Admitting: Family Medicine

## 2021-02-12 ENCOUNTER — Other Ambulatory Visit: Payer: Self-pay | Admitting: *Deleted

## 2021-02-25 ENCOUNTER — Ambulatory Visit (INDEPENDENT_AMBULATORY_CARE_PROVIDER_SITE_OTHER): Payer: BC Managed Care – PPO | Admitting: Gastroenterology

## 2021-02-25 ENCOUNTER — Encounter: Payer: Self-pay | Admitting: Gastroenterology

## 2021-02-25 ENCOUNTER — Other Ambulatory Visit: Payer: Self-pay

## 2021-02-25 VITALS — BP 130/78 | HR 68 | Ht 64.0 in | Wt 143.0 lb

## 2021-02-25 DIAGNOSIS — R12 Heartburn: Secondary | ICD-10-CM | POA: Diagnosis not present

## 2021-02-25 DIAGNOSIS — R053 Chronic cough: Secondary | ICD-10-CM

## 2021-02-25 DIAGNOSIS — K581 Irritable bowel syndrome with constipation: Secondary | ICD-10-CM

## 2021-02-25 NOTE — Progress Notes (Signed)
Chief Complaint:    Esophageal hypersensitivity, throat clearing, heartburn  GI History: 50 year old female with history of Breast CA (diagnosed 2020 now s/p b/l mastectomy; on tamoxifen until 2025), IBS-C, Heartburn.   1) Hx of IBS-C.  Index sxs were constipation predominant. Has a long hx of previously diagnosed IBS-C, which was well controlled with dietary mods, high fiber diet, and Miralax. Intermittent generalized abdominal cramping and abdominal bloating w/ visible abdominal distension. No n/v/f/c. Previously tried [employer], and stopped due to diarrhea and abdominal pain after a few days.  Trialed Amitiza in 08/2018, but not effective (although may have only tried the 8 MCG dose).  Started on Trulance, which initially worked, but has since reverted back to constipation.  Sitz marker study with retained stool but no retained markers in 02/2019.  Trialed MiraLAX with plan to consider going back to Amitiza (higher dose) or trial Motegrity pending response.  Otherwise, no hematochezia, melena.   2) Hx of Heartburn/Esophageal hypersensitivity: Longstanding history of heartburn and regurgitation, treated with Nexium for years, daily with occassional need for 2nd dose for breakthrough sxs. Worsening sxs in 2020 with increasing breakthrough sxs. EGD in 2012 was only n/f gastritis and esophagitis per patient. No dysphagia or odynophagia, and no early satiety.   For ongoing reflux, started on Dexilant, which was initially quite effective, eventually lost efficacy.  EGD essentially normal in 08/2018.  Diagnosed with esophageal hypersensitivity on EGD with Bravo in 06/2019.  No improvement with FD guard.   Endoscopic Hx: - EGD (09/2010): Erythema in antrum compatible with gastritis. Otherwise normal. No actual report in EMR for review.  - EGD (08/2018, Dr. Bryan Lemma): Normal -Colonoscopy (08/2018, Dr. Bryan Lemma): 2 small benign polyps in transverse colon, suboptimal prep.  Biopsies negative for [facility] -EGD with  Bravo (03/2019, Dr. Bryan Lemma): Gastric inlet patch, regular Z-line, normal stomach/duodenum.  Hill grade 2.  Bravo attached but failed communication with recorder -EGD with Bravo (06/2019, Dr. Bryan Lemma): Gastric inlet patch, regular Z-line, normal stomach/duodenum.  Hill grade 2.  Bravo placed -Bravo (06/2019): Normal, [place] 3.4.  Good symptom correlation for heartburn with reflux.  Normal number of reflux events.  Consistent with esophageal hypersensitivity.    HPI:     Patient is a 50 y.o. female presenting to the [facility]ology [facility] for follow-up.  Last seen by me on 08/12/2019 (virtual appointment).  Heartburn was generally controlled with some postprandial breakthrough with dietary indiscretions.  Was still taking Dexilant and Tums a few days/week.  Added Pepcid 20 mg for heartburn symptoms.  Was already taking Effexor 75 mg/day so no additional neuro modulating Rx was prescribed.  Constipation was well controlled.  Trialed FD guard in 03/2020 with no appreciable improvement.  Today, she states she is back to daily HB, throat clearing, and chronic cough. No improvement with Protonix 40 mg BID.  Still taking Effexor 75 mg/day as prescribed.  No new abdominal imaging for review.  Review of systems:     No chest pain, no SOB, no fevers, no urinary sx   Past Medical History:  Diagnosis Date   Allergy    allergist in [place]   Anxiety    Asthma    Breast cancer ([place])    Chronic headache    Depression    Fibromyalgia    Fibromyalgia 2006   pain / headaches   GERD (gastroesophageal reflux disease)    IBS (irritable bowel syndrome)    Osteoarthritis    in her hips   Vaginismus  Patient's surgical history, family medical history, social history, medications and allergies were all reviewed in Epic    Current Outpatient Medications  Medication Sig Dispense Refill   acetaminophen (TYLENOL) 650 MG CR tablet Take 1-2 tablets (650-1,300 mg total) by mouth in the morning and at  bedtime. (Patient not taking: No sig reported) 90 tablet 3   Cholecalciferol (DIALYVITE VITAMIN D 5000) 125 MCG (5000 UT) capsule Take 5,000 Units by mouth daily. (Patient not taking: Reported on 01/18/2021)     fluticasone (FLONASE) 50 MCG/ACT nasal spray Place 1 spray into both nostrils daily.     gabapentin (NEURONTIN) 100 MG capsule Take 1-3 capsules (100-300 mg total) by mouth at bedtime. 90 capsule 3   levalbuterol (XOPENEX HFA) 45 MCG/ACT inhaler Inhale 2 puffs into the lungs every 6 (six) hours as needed for wheezing or shortness of breath. Pt will call when needs to filled. 15 g 1   Multiple Vitamins-Minerals (HAIR SKIN AND NAILS FORMULA PO) Take 2 tablets by mouth daily.     pantoprazole (PROTONIX) 40 MG tablet Take 1 tablet (40 mg total) by mouth 2 (two) times daily before a meal. 60 tablet 0   polyethylene glycol (MIRALAX / GLYCOLAX) 17 g packet Take 17 g by mouth 2 (two) times daily.      QULIPTA 60 MG TABS Take 1 tablet by mouth daily.  11   QVAR REDIHALER 80 MCG/ACT inhaler Inhale 2 puffs into the lungs 2 (two) times daily. 10.6 g prn   tamoxifen (NOLVADEX) 20 MG tablet Take 1 tablet by mouth daily.     traZODone (DESYREL) 100 MG tablet TAKE 1 & 1/2 TABLETS ('150MG'$  TOTAL) BY MOUTH AT BEDTIME 135 tablet 1   venlafaxine (EFFEXOR) 75 MG tablet TAKE 1 TABLET BY MOUTH EVERY DAY 90 tablet 1   No current facility-administered medications for this visit.    Physical Exam:     BP 130/78   Pulse 68   Ht '5\' 4"'$  (1.626 m)   Wt 143 lb (64.9 kg)   SpO2 100%   BMI 24.55 kg/m   GENERAL:  Pleasant female in NAD PSYCH: : Cooperative, normal affect NEURO: Alert and oriented x 3, no focal neurologic deficits   IMPRESSION and PLAN:    1) Esophageal Hypersensitivity - We again discussed the diagnosis of Esophageal Hypersensitivity at length today.  Discussed typical treatment plan is for neuromodulation.  She is currently prescribed Effexor 75 mg/day, which typically provides good relief.     -We will discuss with her PCP about starting nortriptyline 25 mg nightly, as she is already taking Effexor 75 mg/day. - Alternatively, we discussed further testing with Esophageal Manometry and pH/impedance testing off all acid suppression therapy x7 days.  This would mostly be confirmatory for the underlying hypersensitivity diagnosis noted on Bravo - Can continue taking her PPI for now, but based on underlying hypersensitivity diagnosis, do not feel that continued high-dose PPI will be especially beneficial in the long-term - RTC in 3 months or sooner as needed  2) IBS-C: - Well-controlled on current regimen  I spent 30 minutes of time, including in depth chart review, independent review of results as outlined above, communicating results with the patient directly, face-to-face time with the patient, coordinating care, and ordering studies and medications as appropriate, and documentation.      Lavena Bullion ,DO, FACG 02/25/2021, 2:27 PM

## 2021-02-25 NOTE — Patient Instructions (Signed)
If you are age 48 or older, your body mass index should be between 23-30. Your Body mass index is 24.55 kg/m. If this is out of the aforementioned range listed, please consider follow up with your Primary Care Provider.  If you are age 66 or younger, your body mass index should be between 19-25. Your Body mass index is 24.55 kg/m. If this is out of the aformentioned range listed, please consider follow up with your Primary Care Provider.   Due to recent changes in healthcare laws, you may see the results of your imaging and laboratory studies on MyChart before your provider has had a chance to review them.  We understand that in some cases there may be results that are confusing or concerning to you. Not all laboratory results come back in the same time frame and the provider may be waiting for multiple results in order to interpret others.  Please give [place] 48 hours in order for your provider to thoroughly review all the results before contacting the office for clarification of your results.    Thank you for choosing me and [facility].  Vito Cirigliano, D.O.

## 2021-02-26 ENCOUNTER — Telehealth: Payer: Self-pay

## 2021-02-26 MED ORDER — NORTRIPTYLINE HCL 25 MG PO CAPS: 25.0000 mg | ORAL_CAPSULE | Freq: Every day | ORAL | 3 refills | Status: DC

## 2021-02-26 NOTE — Telephone Encounter (Signed)
-----   Message from [place], DO sent at 02/26/2021 12:44 PM EDT ----- Can you please call this patient to let her know that I discussed her case with Dr. Madilyn Fireman who agrees with [place] starting her on nortriptyline to help with her Esophageal Hypersensitivity.  Can you please send in Rx for nortriptyline 25 mg qhs, #60, RF 3.  Can start at this dose and we could slowly uptitrate if needed.  Thank you.

## 2021-02-26 NOTE — Telephone Encounter (Signed)
Spoke with patient in regards to information below. [facility]. Advised patient to try this dose for a couple of weeks but if she does not notice any improvement to let us know and some adjustments may be made to the dosing. Patient will continue PPI as well. Patient verbalized understanding and had no concerns at the end of the call.

## 2021-02-26 NOTE — Telephone Encounter (Signed)
Prescription has been sent to requested pharmacy.

## 2021-02-26 NOTE — Telephone Encounter (Signed)
Based on the flagged reaction being intolerant/constipation, think it should be okay to proceed with nortriptyline as prescribed.  Additionally, since we are starting at low-dose, feel this also places her at low risk.

## 2021-02-26 NOTE — Telephone Encounter (Signed)
Dr. Bryan Lemma, trying to send in RX for Nortriptyline and it has been flagged due to patient's allergy to   Flexeril [Cyclobenzaprine]Noted: 11/29/2016  Severity: Low  Reactions: Other (See Comments)  Reaction type: Intolerance  Comment: constipation   Please advise if OK to continue with RX, thanks

## 2021-03-02 ENCOUNTER — Other Ambulatory Visit: Payer: Self-pay | Admitting: Gastroenterology

## 2021-03-11 ENCOUNTER — Telehealth: Payer: Self-pay | Admitting: Gastroenterology

## 2021-03-11 NOTE — Telephone Encounter (Signed)
Spoke with patient, she states that she has been on the [name] 25 mg for about [redacted] weeks along with Effexor 75 mg. Patient states that it is working ok, her symptoms have seemed to decrease. She states that she does not wake up with any throat clearing anymore and has less throughout the day. Patient is asking to increase [name] because by nighttime her symptoms return, she states that with an increased dose it may keep her symptoms controlled all day/night. Please advise, thanks.

## 2021-03-12 MED ORDER — NORTRIPTYLINE HCL 10 MG PO CAPS: 10.0000 mg | ORAL_CAPSULE | Freq: Every day | ORAL | 1 refills | Status: DC

## 2021-03-12 NOTE — Telephone Encounter (Signed)
Spoke with patient in regards to recommendations. Pt has capsule form of [name]. I checked the medication database and they only have this in capsule form. They offer [name] 10 mg/25 mg/50 mg/75 mg. Pt is aware htat I will call back once I clarify dose. Please advise on dosing, thanks.

## 2021-03-12 NOTE — Telephone Encounter (Signed)
Spoke with patient in regards to recommendations. Patient will take [name] 35 mg at bedtime and will let us know if we need to uptitrate. She is aware that [facility] [place] [facility] [address]. Pt had no concerns at the end of the call.

## 2021-03-12 NOTE — Telephone Encounter (Signed)
Thank you for checking on that.  In that case, please send in an Rx for [name] 10 mg capsules, #30, RF 1.  Plan to take 35 mg/day.  If that dose works well, will hold at 35 mg/day.  Otherwise, can plan to uptitrate as needed.  Thank you.

## 2021-03-20 ENCOUNTER — Other Ambulatory Visit: Payer: Self-pay | Admitting: Family Medicine

## 2021-03-20 DIAGNOSIS — N951 Menopausal and female climacteric states: Secondary | ICD-10-CM

## 2021-04-03 ENCOUNTER — Other Ambulatory Visit: Payer: Self-pay | Admitting: Gastroenterology

## 2021-04-19 ENCOUNTER — Other Ambulatory Visit: Payer: Self-pay | Admitting: Gastroenterology

## 2021-04-27 ENCOUNTER — Ambulatory Visit (INDEPENDENT_AMBULATORY_CARE_PROVIDER_SITE_OTHER): Payer: BC Managed Care – PPO

## 2021-04-27 ENCOUNTER — Ambulatory Visit: Payer: BC Managed Care – PPO | Admitting: Family Medicine

## 2021-04-27 ENCOUNTER — Other Ambulatory Visit: Payer: Self-pay

## 2021-04-27 ENCOUNTER — Ambulatory Visit: Payer: BC Managed Care – PPO | Admitting: Medical-Surgical

## 2021-04-27 ENCOUNTER — Encounter: Payer: Self-pay | Admitting: Medical-Surgical

## 2021-04-27 VITALS — BP 143/85 | HR 92 | Resp 20 | Ht 64.0 in | Wt 146.2 lb

## 2021-04-27 DIAGNOSIS — R0781 Pleurodynia: Secondary | ICD-10-CM

## 2021-04-27 DIAGNOSIS — Z23 Encounter for immunization: Secondary | ICD-10-CM | POA: Diagnosis not present

## 2021-04-27 MED ORDER — TRAMADOL HCL 50 MG PO TABS: 50.0000 mg | ORAL_TABLET | Freq: Three times a day (TID) | ORAL | 0 refills | Status: AC | PRN

## 2021-04-27 MED ORDER — KETOROLAC TROMETHAMINE 60 MG/2ML IM SOLN
60.0000 mg | Freq: Once | INTRAMUSCULAR | Status: AC
  Administered 2021-04-27: 60 mg via INTRAMUSCULAR
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
## 2021-04-28 ENCOUNTER — Telehealth: Payer: Self-pay

## 2021-04-28 NOTE — Telephone Encounter (Signed)
Patient called to ask about x-ray results. Told the patient since they have not been reviewed by the ordering provider I could not disclose results and a message would be sent for the provider to review at earliest convenience. She does have MyChart.

## 2021-05-24 ENCOUNTER — Ambulatory Visit (INDEPENDENT_AMBULATORY_CARE_PROVIDER_SITE_OTHER): Payer: BC Managed Care – PPO | Admitting: Physician Assistant

## 2021-05-24 ENCOUNTER — Encounter: Payer: Self-pay | Admitting: Physician Assistant

## 2021-05-24 ENCOUNTER — Other Ambulatory Visit: Payer: Self-pay

## 2021-05-24 VITALS — BP 136/79 | HR 102 | Temp 98.4°F | Ht 64.0 in | Wt 147.0 lb

## 2021-05-24 DIAGNOSIS — J0140 Acute pansinusitis, unspecified: Secondary | ICD-10-CM | POA: Diagnosis not present

## 2021-05-24 MED ORDER — DOXYCYCLINE HYCLATE 100 MG PO TABS: 100.0000 mg | ORAL_TABLET | Freq: Two times a day (BID) | ORAL | 0 refills | Status: DC

## 2021-05-24 NOTE — Patient Instructions (Signed)

## 2021-05-24 NOTE — Progress Notes (Signed)
Subjective:    Patient ID: Tara Howard, female    DOB: 10/08/1970, 50 y.o.   MRN: [id number]  HPI Patient is a 50 year old female with a history of asthma and sinus infections.  She presents to the clinic with 2 to 3 weeks of upper respiratory symptoms that have progressed into sinus pressure and pain.  She is blowing out times of green to yellow sputum.  Tends to be a little worse on the right than left.  She denies any shortness of breath, fever, body aches, chills.  She has tested negative for COVID.  She does have a mild dry cough.  She is taking Tylenol and Mucinex with little relief.  She tends to get sinus infections every year.  She is also using some Flonase.  .. 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   Left ankle pain 08/29/2017   Genetic testing 09/28/2018   Malignant neoplasm of left female breast ([place]) 09/06/2018   Anxiety and depression 10/24/2018   Primary osteoarthritis of left hip 01/31/2019   Hot flash due to medication 02/19/2019   Encounter for monitoring tamoxifen therapy 09/28/2018   Increased risk of breast cancer 11/08/2018   Foot trauma, right, initial encounter 01/29/2020   Primary osteoarthritis of first carpometacarpal joint of left hand 09/08/2020   Acute non-recurrent pansinusitis 05/24/2021   Resolved Ambulatory Problems    Diagnosis Date Noted   Acute maxillary sinusitis 05/28/2010   Acute bronchitis 05/21/2010   DYSPEPSIA 01/20/2010   Gastritis without bleeding 10/20/2010   Needs flu shot 03/25/2016   Acute recurrent maxillary sinusitis 08/29/2017   Past Medical History:  Diagnosis Date   Allergy     Anxiety    Asthma    Breast cancer (HCC)    Chronic headache    Depression    GERD (gastroesophageal reflux disease)    IBS (irritable bowel syndrome)    Osteoarthritis      Review of Systems See HPI.     Objective:   Physical Exam Vitals reviewed.  Constitutional:      Appearance: Normal appearance.  HENT:     Head: Normocephalic.     Comments: Tenderness to palpation over maxillary and frontal sinuses.     Right Ear: Tympanic membrane, ear canal and external ear normal.     Left Ear: Tympanic membrane, ear canal and external ear normal.     Nose: Congestion present.     Mouth/Throat:     Mouth: Mucous membranes are moist.     Pharynx: Posterior oropharyngeal erythema present.  Eyes:     Extraocular Movements: Extraocular movements intact.     Conjunctiva/sclera: Conjunctivae normal.     Pupils: Pupils are equal, round, and reactive to light.  Cardiovascular:     Rate and Rhythm: Normal rate and regular rhythm.  Pulmonary:     Effort: Pulmonary effort is normal.     Breath sounds: Normal breath sounds.  Lymphadenopathy:     Cervical: Cervical adenopathy present.  Neurological:     Mental Status: She is alert.  Psychiatric:        Mood and Affect: Mood normal.  Assessment & Plan:  .Marland KitchenJulaine was seen today for sinus problem.  Diagnoses and all orders for this visit:  Acute non-recurrent pansinusitis -     doxycycline (VIBRA-TABS) 100 MG tablet; Take 1 tablet (100 mg total) by mouth 2 (two) times daily. For 10 days.   Sick for over 2 weeks.  Negative covid.  Intolerances to zpak and PCN.  Start doxycycline for 10 days.  Continue flonase and mucinex.  Continue nasal saline washes.  Follow up as needed or if symptoms persist.

## 2021-06-06 ENCOUNTER — Other Ambulatory Visit: Payer: Self-pay | Admitting: Family Medicine

## 2021-06-23 ENCOUNTER — Ambulatory Visit (INDEPENDENT_AMBULATORY_CARE_PROVIDER_SITE_OTHER): Payer: BC Managed Care – PPO | Admitting: Physician Assistant

## 2021-06-23 ENCOUNTER — Other Ambulatory Visit: Payer: Self-pay

## 2021-06-23 ENCOUNTER — Encounter: Payer: Self-pay | Admitting: Physician Assistant

## 2021-06-23 VITALS — BP 133/83 | HR 104 | Temp 98.6°F | Wt 146.0 lb

## 2021-06-23 DIAGNOSIS — J4530 Mild persistent asthma, uncomplicated: Secondary | ICD-10-CM | POA: Diagnosis not present

## 2021-06-23 DIAGNOSIS — Z79899 Other long term (current) drug therapy: Secondary | ICD-10-CM | POA: Diagnosis not present

## 2021-06-23 DIAGNOSIS — J0140 Acute pansinusitis, unspecified: Secondary | ICD-10-CM | POA: Diagnosis not present

## 2021-06-23 MED ORDER — METHYLPREDNISOLONE 4 MG PO TBPK: ORAL_TABLET | ORAL | 0 refills | Status: DC

## 2021-06-23 MED ORDER — MOXIFLOXACIN HCL 400 MG PO TABS: 400.0000 mg | ORAL_TABLET | Freq: Every day | ORAL | 0 refills | Status: AC

## 2021-06-23 MED ORDER — ARNUITY ELLIPTA 100 MCG/ACT IN AEPB: 1.0000 | INHALATION_SPRAY | Freq: Every day | RESPIRATORY_TRACT | 2 refills | Status: DC

## 2021-06-23 NOTE — Progress Notes (Signed)
Subjective:    Patient ID: Tara Howard, female    DOB: 1971/05/18, 50 y.o.   MRN: [id number]  HPI Pt is a 50 yo female with asthma who presents to the clinic to follow up after being treated for sinusitis on 10/31 and still having symptoms.   Via 10/31 note:    Sick for over 2 weeks.  Negative covid.  Intolerances to zpak and PCN.  Start doxycycline for 10 days.  Continue flonase and mucinex.  Continue nasal saline washes.  Follow up as needed or if symptoms persist.   Pt symptoms got much better but then came right back. Blowing out green sputum from nose. Lots of sinus pressure and congestion. No SOB, fever, body aches. She does have a dry cough. On qvar but insurance is not going to pay for it anymore and needs switched. Failed advair/pulmicort.   .. 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   Left ankle pain 08/29/2017   Genetic testing 09/28/2018   Malignant neoplasm of left female breast ([place]) 09/06/2018   Anxiety and depression 10/24/2018   Primary osteoarthritis of left hip 01/31/2019   Hot flash due to medication 02/19/2019   Encounter for monitoring tamoxifen therapy 09/28/2018   Increased risk of breast cancer 11/08/2018   Foot trauma, right, initial encounter 01/29/2020   Primary osteoarthritis of first carpometacarpal joint of left hand 09/08/2020   Acute non-recurrent pansinusitis 05/24/2021   Mild persistent asthma without complication 59/56/3875   Resolved Ambulatory Problems    Diagnosis Date Noted   Acute maxillary sinusitis 05/28/2010   Acute bronchitis 05/21/2010   DYSPEPSIA 01/20/2010   Gastritis without  bleeding 10/20/2010   Needs flu shot 03/25/2016   Acute recurrent maxillary sinusitis 08/29/2017   Past Medical History:  Diagnosis Date   Allergy    Anxiety    Asthma    Breast cancer (HCC)    Chronic headache    Depression    GERD (gastroesophageal reflux disease)    IBS (irritable bowel syndrome)    Osteoarthritis      Review of Systems See HPI.     Objective:   Physical Exam Vitals reviewed.  Constitutional:      Appearance: Normal appearance.  HENT:     Head: Normocephalic.     Comments: Tenderness to palpation over maxillary sinuses    Right Ear: Tympanic membrane, ear canal and external ear normal. There is no impacted cerumen.     Left Ear: Tympanic membrane, ear canal and external ear normal. There is no impacted cerumen.     Nose: Congestion present. No rhinorrhea.     Mouth/Throat:     Mouth: Mucous membranes are moist.     Pharynx: Posterior oropharyngeal erythema present. No oropharyngeal exudate.  Eyes:     Conjunctiva/sclera: Conjunctivae normal.  Cardiovascular:     Rate and Rhythm: Normal rate and regular rhythm.     Pulses: Normal pulses.     Heart sounds: Normal heart sounds.  Pulmonary:     Effort: Pulmonary effort is normal.     Breath sounds: Normal breath sounds.  Lymphadenopathy:     Cervical: Cervical adenopathy present.  Neurological:     Mental Status: She is alert.  Psychiatric:        Mood and Affect: Mood normal.          Assessment & Plan:  .Marland KitchenJulaine was seen today for sinusitis.  Diagnoses and all orders for this visit:  Acute non-recurrent pansinusitis -     moxifloxacin (AVELOX) 400 MG tablet; Take 1 tablet (400 mg total) by mouth daily for 10 days. -     methylPREDNISolone (MEDROL DOSEPAK) 4 MG TBPK tablet; Take as directed by package insert.  Medication management  Mild persistent asthma without complication -     Fluticasone Furoate (ARNUITY ELLIPTA) 100 MCG/ACT AEPB; Inhale 1 puff into the lungs daily.  PCN  and zpak intolerance Finished doxycycline Sent avelox for 10 days with medrol dose pack Continue flonase Follow up as needed.   Qvar not covered on insurance. Sent arnuity. Failed advair and pulmicort

## 2021-06-23 NOTE — Patient Instructions (Signed)

## 2021-07-07 ENCOUNTER — Other Ambulatory Visit: Payer: Self-pay | Admitting: Family Medicine

## 2021-07-07 ENCOUNTER — Telehealth: Payer: Self-pay | Admitting: Gastroenterology

## 2021-07-07 NOTE — Telephone Encounter (Signed)
Left a voicemail for the patient to call and verify with me what mg of pamelor she is needing.

## 2021-07-07 NOTE — Telephone Encounter (Signed)
Patient called requesting a refill on Nortriptyline.

## 2021-07-08 MED ORDER — NORTRIPTYLINE HCL 50 MG PO CAPS: 50.0000 mg | ORAL_CAPSULE | Freq: Every day | ORAL | 5 refills | Status: DC

## 2021-07-08 NOTE — Telephone Encounter (Signed)
The Patient called to requested an increase un her  [name].

## 2021-07-08 NOTE — Telephone Encounter (Signed)
Last I recall she was taking 35 mg qhs.  Is this still the case?  Are symptoms well controlled or if she requesting increase?  If medication not as efficacious anymore and requesting increase, okay to increase to 50 mg qhs with plan for follow-up with PCM for any escalating doses beyond that.

## 2021-07-08 NOTE — Telephone Encounter (Signed)
LVM that rx for 50 mg at bedtime for nortriptyline is being sent to the [facility] [address]. If this dosage does not work he would like the patient to see her PCP for medication managment

## 2021-07-17 ENCOUNTER — Other Ambulatory Visit: Payer: Self-pay | Admitting: Family Medicine

## 2021-07-17 DIAGNOSIS — N951 Menopausal and female climacteric states: Secondary | ICD-10-CM

## 2021-07-31 ENCOUNTER — Other Ambulatory Visit: Payer: Self-pay | Admitting: Gastroenterology

## 2021-08-05 ENCOUNTER — Other Ambulatory Visit: Payer: Self-pay | Admitting: Family Medicine

## 2021-08-05 NOTE — Telephone Encounter (Signed)
Arnuity not covered so prescription sent for Flovent.

## 2021-08-13 ENCOUNTER — Ambulatory Visit (INDEPENDENT_AMBULATORY_CARE_PROVIDER_SITE_OTHER): Payer: BC Managed Care – PPO | Admitting: Gastroenterology

## 2021-08-13 ENCOUNTER — Other Ambulatory Visit: Payer: Self-pay

## 2021-08-13 ENCOUNTER — Encounter: Payer: Self-pay | Admitting: Gastroenterology

## 2021-08-13 VITALS — BP 132/80 | HR 93 | Ht 64.0 in | Wt 150.4 lb

## 2021-08-13 DIAGNOSIS — K219 Gastro-esophageal reflux disease without esophagitis: Secondary | ICD-10-CM

## 2021-08-13 DIAGNOSIS — R12 Heartburn: Secondary | ICD-10-CM

## 2021-08-13 DIAGNOSIS — K581 Irritable bowel syndrome with constipation: Secondary | ICD-10-CM

## 2021-08-13 MED ORDER — NORTRIPTYLINE HCL 50 MG PO CAPS: 50.0000 mg | ORAL_CAPSULE | Freq: Every day | ORAL | 3 refills | Status: DC

## 2021-08-13 MED ORDER — RABEPRAZOLE SODIUM 20 MG PO TBEC
20.0000 mg | DELAYED_RELEASE_TABLET | Freq: Two times a day (BID) | ORAL | 0 refills | Status: DC
Start: 2021-08-13 — End: 2021-11-10

## 2021-08-13 NOTE — Patient Instructions (Addendum)
If you are age 49 or older, your body mass index should be between 23-30. Your Body mass index is 25.81 kg/m. If this is out of the aforementioned range listed, please consider follow up with your Primary Care Provider.  If you are age 22 or younger, your body mass index should be between 19-25. Your Body mass index is 25.81 kg/m. If this is out of the aformentioned range listed, please consider follow up with your Primary Care Provider.   __________________________________________________________  The [facility] providers would like to encourage you to use [facility] to communicate with providers for non-urgent requests or questions.  Due to long hold times on the telephone, sending your provider a message by [facility] may be a faster and more efficient way to get a response.  Please allow 48 business hours for a response.  Please remember that this is for non-urgent requests.    We have sent the following medications to your pharmacy for you to pick up at your convenience:  Nortriptyline 50 mg at bedtime Aciphex 20mg  twice daily  Return to the clinic in 6 months please call to schedule [phone].  Stop taking your Protonix.  Thank you for choosing me and [facility].  Vito Cirigliano, D.O.

## 2021-08-13 NOTE — Progress Notes (Signed)
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   1) Hx of IBS-C.  Index sxs were constipation predominant. Has a long hx of previously diagnosed IBS-C, which was well controlled with dietary mods, high fiber diet, and Miralax. Intermittent generalized abdominal cramping and abdominal bloating w/ visible abdominal distension. No n/v/f/c. Previously tried [employer], and stopped due to diarrhea and abdominal pain after a few days.  Trialed Amitiza in 08/2018, but not effective (although may have only tried the 8 MCG dose).  Started on Trulance, which initially worked, but has since reverted back to constipation.  Sitz marker study with retained stool but no retained markers in 02/2019.  Trialed MiraLAX with plan to consider going back to Amitiza (higher dose) or trial Motegrity pending response.  Otherwise, no hematochezia, melena.   2) Esophageal hypersensitivity/heartburn: Longstanding history of heartburn and regurgitation, treated with Nexium for years, daily with occassional need for 2nd dose for breakthrough sxs. Worsening sxs in 2020 with increasing breakthrough sxs. EGD in 2012 was only n/f gastritis and esophagitis per patient. No dysphagia or odynophagia, and no early satiety.   For ongoing reflux, started on Dexilant, which was initially quite effective, eventually lost efficacy.  EGD essentially normal in 08/2018.  Diagnosed with esophageal hypersensitivity on EGD with Bravo in 06/2019.  No improvement with FD guard.  No change with high-dose Protonix.  Started nortriptyline ([name]) and titrated up to 50 mg qhs with good clinical response.   Endoscopic Hx: - EGD (09/2010): Erythema in antrum compatible with gastritis. Otherwise normal. No actual report in EMR for review.  - EGD (08/2018, Dr.  Bryan Lemma): Normal -Colonoscopy (08/2018, Dr. Bryan Lemma): 2 small benign polyps in transverse colon, suboptimal prep.  Biopsies negative for [facility] -EGD with Bravo (03/2019, Dr. Bryan Lemma): Gastric inlet patch, regular Z-line, normal stomach/duodenum.  Hill grade 2.  Bravo attached but failed communication with recorder -EGD with Bravo (06/2019, Dr. Bryan Lemma): Gastric inlet patch, regular Z-line, normal stomach/duodenum.  Hill grade 2.  Bravo placed -Bravo (06/2019): Normal, [place] 3.4.  Good symptom correlation for heartburn with reflux.  Normal number of reflux events.  Consistent with esophageal hypersensitivity.  HPI:     Patient is a 52 y.o. female presenting to the Gastroenterology Clinic for follow-up.  Last seen by me 02/25/2021.  Main issue at that time was heartburn and chronic cough.  No change with high-dose PPI.  Was already prescribed Effexor 75 mg/day.  Discussed with PCP and added nortriptyline 25 mg nightly with some improvement and eventually titrated up to 50 mg/night with nice response.  Was treated for sinusitis in 05/2021 requiring Abx x2 and prednisone.  Today, still with globus sensation and increased throat clearing. Occasional HB. Overall "not too bad". Still taking [name] and Protonix.   IBS-C otherwise well controlled with MiraLAX and Effexor.  Review of systems:     No chest pain, no SOB, no fevers, no urinary sx   Past Medical History:  Diagnosis Date   Allergy    allergist in [place]   Anxiety    Asthma    Breast cancer ([place])    Chronic headache    Depression    Fibromyalgia    Fibromyalgia 2006   pain / headaches   GERD (gastroesophageal reflux disease)    IBS (irritable bowel syndrome)    Osteoarthritis    in  her hips   Vaginismus     Patient's surgical history, family medical history, social history, medications and allergies were all reviewed in Epic    Current Outpatient Medications  Medication Sig Dispense Refill   acetaminophen (TYLENOL)  650 MG CR tablet Take 1-2 tablets (650-1,300 mg total) by mouth in the morning and at bedtime. 90 tablet 3   Cholecalciferol (DIALYVITE VITAMIN D 5000) 125 MCG (5000 UT) capsule Take 5,000 Units by mouth daily.     fluticasone (FLONASE) 50 MCG/ACT nasal spray Place 1 spray into both nostrils daily.     fluticasone (FLOVENT HFA) 220 MCG/ACT inhaler Inhale 2 puffs into the lungs in the morning and at bedtime. 1 each 3   gabapentin (NEURONTIN) 100 MG capsule TAKE 1-3 CAPSULES (100-300 MG TOTAL) BY MOUTH AT BEDTIME. 90 capsule 0   levalbuterol (XOPENEX HFA) 45 MCG/ACT inhaler Inhale 2 puffs into the lungs every 6 (six) hours as needed for wheezing or shortness of breath. Pt will call when needs to filled. 15 g 1   Multiple Vitamins-Minerals (HAIR SKIN AND NAILS FORMULA PO) Take 2 tablets by mouth daily.     nortriptyline ([name]) 50 MG capsule TAKE 1 CAPSULE BY MOUTH AT BEDTIME. 90 capsule 1   pantoprazole (PROTONIX) 40 MG tablet TAKE 1 TABLET (40 MG TOTAL) BY MOUTH 2 (TWO) TIMES DAILY BEFORE A MEAL. 180 tablet 1   polyethylene glycol (MIRALAX / GLYCOLAX) 17 g packet Take 17 g by mouth 2 (two) times daily.      QULIPTA 60 MG TABS Take 1 tablet by mouth daily.  11   tamoxifen (NOLVADEX) 20 MG tablet Take 1 tablet by mouth daily.     traZODone (DESYREL) 100 MG tablet TAKE 1 & 1/2 TABLETS (150MG  TOTAL) BY MOUTH AT BEDTIME 135 tablet 1   venlafaxine (EFFEXOR) 75 MG tablet TAKE 1 TABLET BY MOUTH EVERY DAY 90 tablet 1   No current facility-administered medications for this visit.    Physical Exam:     BP 132/80    Pulse 93    Ht 5\' 4"  (1.626 m)    Wt 150 lb 6 oz (68.2 kg)    SpO2 95%    BMI 25.81 kg/m   GENERAL:  Pleasant female in NAD PSYCH: : Cooperative, normal affect Musculoskeletal:  Normal muscle tone, normal strength NEURO: Alert and oriented x 3, no focal neurologic deficits   IMPRESSION and PLAN:    1) Esophageal Hypersensitivity 2) Heartburn 3) Globus sensation  - Trial empiric  change from Protonix to Aciphex 20 mg PO BID - Refilled [name] 50 mg qhs  4) IBS - Well-controlled on current regimen  - RTC in 6 months or sooner prn          Lavena Bullion ,DO, FACG 08/13/2021, 8:56 AM

## 2021-08-17 ENCOUNTER — Other Ambulatory Visit: Payer: Self-pay | Admitting: Family Medicine

## 2021-08-18 ENCOUNTER — Other Ambulatory Visit: Payer: Self-pay | Admitting: Family Medicine

## 2021-08-18 DIAGNOSIS — N951 Menopausal and female climacteric states: Secondary | ICD-10-CM

## 2021-08-31 ENCOUNTER — Other Ambulatory Visit: Payer: Self-pay

## 2021-08-31 ENCOUNTER — Encounter: Payer: Self-pay | Admitting: Family Medicine

## 2021-08-31 ENCOUNTER — Telehealth: Payer: Self-pay | Admitting: Family Medicine

## 2021-08-31 ENCOUNTER — Ambulatory Visit (INDEPENDENT_AMBULATORY_CARE_PROVIDER_SITE_OTHER): Payer: BC Managed Care – PPO | Admitting: Family Medicine

## 2021-08-31 VITALS — BP 139/80 | HR 82 | Ht 64.0 in | Wt 152.0 lb

## 2021-08-31 DIAGNOSIS — J019 Acute sinusitis, unspecified: Secondary | ICD-10-CM

## 2021-08-31 DIAGNOSIS — J4541 Moderate persistent asthma with (acute) exacerbation: Secondary | ICD-10-CM

## 2021-08-31 MED ORDER — PREDNISONE 20 MG PO TABS: 40.0000 mg | ORAL_TABLET | Freq: Every day | ORAL | 0 refills | Status: DC

## 2021-08-31 MED ORDER — FLUTICASONE FUROATE-VILANTEROL 100-25 MCG/ACT IN AEPB: 1.0000 | INHALATION_SPRAY | Freq: Every day | RESPIRATORY_TRACT | 1 refills | Status: DC

## 2021-08-31 MED ORDER — CEFDINIR 300 MG PO CAPS: 300.0000 mg | ORAL_CAPSULE | Freq: Two times a day (BID) | ORAL | 0 refills | Status: DC

## 2021-08-31 NOTE — Telephone Encounter (Signed)
Routing request to [employer] to begin prior auth process for patient.

## 2021-08-31 NOTE — Telephone Encounter (Signed)
Please see if we can initiate a prior authorization for her Qvar.  She got a note that it was not covered by her insurance but she was not sure if that was because it needs authorization.

## 2021-08-31 NOTE — Progress Notes (Signed)
Established Patient Office Visit  Subjective:  Patient ID: Tara Howard, female    DOB: 11/25/1970  Age: 51 y.o. MRN: [id number]  CC:  Chief Complaint  Patient presents with   Acute Visit   Cough   Sinusitis    HPI Tara Howard presents for cough and congestion.  She does have a history of recurrent sinusitis she had a sinus infection at the end of October and then again in November.  She started having sinus symptoms again at the end of January.  She has had a lot of drainage and now she is getting green and yellow mucus lots of facial pressure.  Complaining of sinus pressure, increased mucus production, and coughing.  Her inhaler was switched to Arnuity back in November, from Qvar which was no longer covered on her insurance plan.  She had previously tried Advair and Pulmicort.  She feels like she has been coughing more with the Arnuity in general.  She said she really to started a few weeks ago because she still had some Qvar leftover.  But after only being on it for 1 to 2 weeks she noticed a ramp up in her cough.  Past Medical History:  Diagnosis Date   Allergy    allergist in [place]   Anxiety    Asthma    Breast cancer ([place])    Chronic headache    Depression    Fibromyalgia    Fibromyalgia 2006   pain / headaches   GERD (gastroesophageal reflux disease)    IBS (irritable bowel syndrome)    Osteoarthritis    in her hips   Vaginismus     Past Surgical History:  Procedure Laterality Date   bilateral mastectomy  11/01/2018   BRAVO [place] STUDY N/A 03/26/2019   Procedure: BRAVO [place] STUDY;  Surgeon: Lavena Bullion, DO;  [facility];  Service: Gastroenterology;  Laterality: N/A;   BRAVO [place] STUDY N/A 06/27/2019   Procedure: BRAVO [place];  Surgeon: Lavena Bullion, DO;  [facility];  Service: Gastroenterology;  Laterality: N/A;   BREAST LUMPECTOMY Left 09/2018   BREAST RECONSTRUCTION  12/2018   CESAREAN SECTION     X 2   ENDOMETRIAL ABLATION      ESOPHAGOGASTRODUODENOSCOPY     said it was done maybe about 10 years ago. In [place]   ESOPHAGOGASTRODUODENOSCOPY (EGD) WITH PROPOFOL N/A 03/26/2019   Procedure: ESOPHAGOGASTRODUODENOSCOPY (EGD) WITH PROPOFOL;  Surgeon: Lavena Bullion, DO;  [facility];  Service: Gastroenterology;  Laterality: N/A;   ESOPHAGOGASTRODUODENOSCOPY (EGD) WITH PROPOFOL N/A 06/27/2019   Procedure: ESOPHAGOGASTRODUODENOSCOPY (EGD) WITH PROPOFOL;  Surgeon: Lavena Bullion, DO;  [facility];  Service: Gastroenterology;  Laterality: N/A;   TONSILLECTOMY     TONSILLECTOMY     TUBAL LIGATION     UPPER GASTROINTESTINAL ENDOSCOPY      Family History  Problem Relation Age of Onset   Depression Mother    Hypertension Mother    Heart attack Mother    CAD Mother        3 vessel bypass   Irritable bowel syndrome Mother    Aneurysm Mother        brain   CAD Father        3 vessel bypass   Cancer Paternal Grandmother        of the bowel per patient    Bone cancer Paternal Grandmother    Colon cancer Neg Hx    Pancreatic cancer Neg Hx  Liver disease Neg Hx    Stomach cancer Neg Hx    Esophageal cancer Neg Hx     Social History   Socioeconomic History   Marital status: Legally Separated    Spouse name: Not on file   Number of children: 2   Years of education: Not on file   Highest education level: Not on file  Occupational History   Occupation: Wellsite geologist: [employer]    Comment: [employer]  Tobacco Use   Smoking status: Never   Smokeless tobacco: Never  Vaping Use   Vaping Use: Never used  Substance and Sexual Activity   Alcohol use: Not Currently   Drug use: Never   Sexual activity: Not Currently    Birth control/protection: Surgical  Other Topics Concern   Not on file  Social History Narrative   Not on file   Social Determinants of Health   Financial Resource Strain: Not on file  Food Insecurity: Not on file   Transportation Needs: Not on file  Physical Activity: Not on file  Stress: Not on file  Social Connections: Not on file  Intimate Partner Violence: Not on file    Outpatient Medications Prior to Visit  Medication Sig Dispense Refill   acetaminophen (TYLENOL) 650 MG CR tablet Take 1-2 tablets (650-1,300 mg total) by mouth in the morning and at bedtime. 90 tablet 3   Cholecalciferol (DIALYVITE VITAMIN D 5000) 125 MCG (5000 UT) capsule Take 5,000 Units by mouth daily.     fluticasone (FLONASE) 50 MCG/ACT nasal spray Place 1 spray into both nostrils daily.     gabapentin (NEURONTIN) 100 MG capsule Take 1-3 capsules (100-300 mg total) by mouth at bedtime. APPT FOR FURTHER REFILLS 90 capsule 0   levalbuterol (XOPENEX HFA) 45 MCG/ACT inhaler Inhale 2 puffs into the lungs every 6 (six) hours as needed for wheezing or shortness of breath. Pt will call when needs to filled. 15 g 1   Multiple Vitamins-Minerals (HAIR SKIN AND NAILS FORMULA PO) Take 2 tablets by mouth daily.     nortriptyline ([name]) 50 MG capsule Take 1 capsule (50 mg total) by mouth at bedtime. 90 capsule 3   polyethylene glycol (MIRALAX / GLYCOLAX) 17 g packet Take 17 g by mouth 2 (two) times daily.      QULIPTA 60 MG TABS Take 1 tablet by mouth daily.  11   RABEprazole (ACIPHEX) 20 MG tablet Take 1 tablet (20 mg total) by mouth in the morning and at bedtime. 180 tablet 0   tamoxifen (NOLVADEX) 20 MG tablet Take 1 tablet by mouth daily.     traZODone (DESYREL) 100 MG tablet TAKE 1 & 1/2 TABLETS (150MG  TOTAL) BY MOUTH AT BEDTIME 135 tablet 1   venlafaxine (EFFEXOR) 75 MG tablet Take 1 tablet (75 mg total) by mouth daily. NEEDS APPT 90 tablet 0   fluticasone (FLOVENT HFA) 220 MCG/ACT inhaler Inhale 2 puffs into the lungs in the morning and at bedtime. 1 each 3   No facility-administered medications prior to visit.    Allergies  Allergen Reactions   Azithromycin Diarrhea, Nausea And Vomiting and Other (See Comments)         Hycodan [Hydrocodone Bit-Homatrop Mbr] Other (See Comments)    Nausea   Augmentin [Amoxicillin-Pot Clavulanate] Diarrhea, Nausea And Vomiting and Other (See Comments)    Stomach cramps Did it involve swelling of the face/tongue/throat, SOB, or low BP? No Did it involve sudden or severe rash/hives, skin  peeling, or any reaction on the inside of your mouth or nose? No Did you need to seek medical attention at a hospital or doctor's office? No When did it last happen? More than 3 years ago    If all above answers are "NO", may proceed with cephalosporin use.    Clonidine Derivatives Rash   Flexeril [Cyclobenzaprine] Other (See Comments)    constipation   Linzess [Linaclotide] Other (See Comments)    cramping   Oxybutynin Rash   Zolpidem Tartrate Other (See Comments)    Hallucinations    ROS Review of Systems    Objective:    Physical Exam Constitutional:      Appearance: She is well-developed.  HENT:     Head: Normocephalic and atraumatic.     Right Ear: Tympanic membrane, ear canal and external ear normal.     Left Ear: Tympanic membrane, ear canal and external ear normal.     Nose: Nose normal.     Mouth/Throat:     Mouth: Mucous membranes are moist.     Pharynx: Oropharynx is clear. No oropharyngeal exudate or posterior oropharyngeal erythema.  Eyes:     Conjunctiva/sclera: Conjunctivae normal.     Pupils: Pupils are equal, round, and reactive to light.  Neck:     Thyroid: No thyromegaly.  Cardiovascular:     Rate and Rhythm: Normal rate and regular rhythm.     Heart sounds: Normal heart sounds.  Pulmonary:     Effort: Pulmonary effort is normal.     Breath sounds: Normal breath sounds. No wheezing.  Musculoskeletal:     Cervical back: Neck supple.  Lymphadenopathy:     Cervical: No cervical adenopathy.  Skin:    General: Skin is warm and dry.  Neurological:     Mental Status: She is alert and oriented to person, place, and time.    BP 139/80    Pulse 82     Ht 5\' 4"  (1.626 m)    Wt 152 lb (68.9 kg)    SpO2 98%    BMI 26.09 kg/m  Wt Readings from Last 3 Encounters:  08/31/21 152 lb (68.9 kg)  08/13/21 150 lb 6 oz (68.2 kg)  06/23/21 146 lb 0.6 oz (66.2 kg)     Health Maintenance Due  Topic Date Due   COVID-19 Vaccine (3 - Pfizer risk series) 11/19/2019   TETANUS/TDAP  07/28/2021    There are no preventive care reminders to display for this patient.  Lab Results  Component Value Date   TSH 1.235 10/09/2014   Lab Results  Component Value Date   WBC 5.3 10/09/2014   HGB 13.8 10/09/2014   HCT 41.7 10/09/2014   MCV 94.1 10/09/2014   PLT 249 10/09/2014   Lab Results  Component Value Date   NA 140 08/08/2016   NA 140 08/08/2016   K 4.3 08/08/2016   K 4.3 08/08/2016   CO2 27 10/09/2014   GLUCOSE 84 10/09/2014   BUN 12 08/08/2016   BUN 18 08/08/2016   CREATININE 0.7 08/08/2016   CREATININE 0.7 08/08/2016   BILITOT 0.5 10/09/2014   ALKPHOS 51 08/08/2016   AST 15 08/08/2016   AST 15 08/08/2016   ALT 13 08/08/2016   ALT 13 08/08/2016   PROT 6.4 10/09/2014   ALBUMIN 4.3 10/09/2014   CALCIUM 9.2 10/09/2014   Lab Results  Component Value Date   CHOL 146 08/08/2016   CHOL 146 08/08/2016   Lab Results  Component Value Date  HDL 48 08/08/2016   HDL 48 08/08/2016   Lab Results  Component Value Date   LDLCALC 85 08/08/2016   LDLCALC 85 08/08/2016   Lab Results  Component Value Date   TRIG 65 08/08/2016   TRIG 65 08/08/2016   No results found for: CHOLHDL No results found for: HGBA1C    Assessment & Plan:   Problem List Items Addressed This Visit       Respiratory   Asthma   Relevant Medications   fluticasone furoate-vilanterol (BREO ELLIPTA) 100-25 MCG/ACT AEPB   predniSONE (DELTASONE) 20 MG tablet   Other Visit Diagnoses     Acute non-recurrent sinusitis, unspecified location    -  Primary   Relevant Medications   predniSONE (DELTASONE) 20 MG tablet   cefdinir (OMNICEF) 300 MG capsule        Acute sinusitis-we will go ahead and treat with Omnicef.  Continue symptomatic care.  If not better in 1 week.  Asthma exacerbation-we will treat with prednisone and bump up to [facility].  If not covered by the insurance we could see if maybe we could get the Advair HFA covered she does prefer a mist instead of powder but for now would like to try Breo since it is once a day.  Call if not improving over the next week or 2.  If she tolerates the Breo well then the plan will be to continue it for 2 months.  We can always work on authorization for the Qvar as well.  As in the past she has tried Advair, Symbicort, now she is also tried Pension scheme manager.  These have not been as effective as the Qvar for her.  Meds ordered this encounter  Medications   fluticasone furoate-vilanterol (BREO ELLIPTA) 100-25 MCG/ACT AEPB    Sig: Inhale 1 puff into the lungs daily.    Dispense:  60 each    Refill:  1   predniSONE (DELTASONE) 20 MG tablet    Sig: Take 2 tablets (40 mg total) by mouth daily with breakfast.    Dispense:  10 tablet    Refill:  0   cefdinir (OMNICEF) 300 MG capsule    Sig: Take 1 capsule (300 mg total) by mouth 2 (two) times daily.    Dispense:  20 capsule    Refill:  0    Follow-up: No follow-ups on file.    Beatrice Lecher, MD

## 2021-09-09 ENCOUNTER — Telehealth: Payer: Self-pay

## 2021-09-09 NOTE — Telephone Encounter (Addendum)
Initiated Prior authorization EHU:DJSH RediHaler 80MCG/ACT aerosol Via: Covermymeds Case/KeyAraceli Bouche) - [id number] Status: Pending as of 09/09/21 Reason:Coverage for this medication is denied for the following reason(s). We reviewed the information we received about your condition and circumstances. We used plan approved criteria when making this decision. The policy states that this medication may be approved when: -The member is unable to take the required number of formulary alternatives for the given diagnosis due to an intolerance or contraindication OR -The member has tried and failed the required number of formulary alternatives Notified Pt via: pt does not have Mychart

## 2021-09-10 NOTE — Telephone Encounter (Signed)
Patient has been notified of update. Advised of prior auths process. No other inquiries during the call.

## 2021-09-10 NOTE — Telephone Encounter (Signed)
Call pt: Pa has been submitted currently pending

## 2021-09-17 ENCOUNTER — Other Ambulatory Visit: Payer: Self-pay | Admitting: Family Medicine

## 2021-09-17 DIAGNOSIS — N951 Menopausal and female climacteric states: Secondary | ICD-10-CM

## 2021-09-23 NOTE — Telephone Encounter (Signed)
Patient called stating the [employer] is not helping her and would like to do an appeal for the Qvar.  ?

## 2021-09-30 ENCOUNTER — Telehealth: Payer: Self-pay

## 2021-09-30 NOTE — Telephone Encounter (Addendum)
Initiated Prior authorization IXB:OERQ REDIHALER 80 MCG/ACT inhaler  ?Via: Covermymeds ?Case/Key:[id number] ?Status: approved  as of 09/30/21 ?Reason:09/30/2021 - 10/01/2022 ?Notified Pt via:pt does not have Mychart, called pt in regards to  medication approval pt acknowledged  ?

## 2021-10-01 MED ORDER — QVAR REDIHALER 80 MCG/ACT IN AERB: 2.0000 | INHALATION_SPRAY | Freq: Two times a day (BID) | RESPIRATORY_TRACT | 3 refills | Status: DC

## 2021-10-01 NOTE — Addendum Note (Signed)
Addended by: Beatrice Lecher D on: 10/01/2021 11:45 AM ? ? Modules accepted: Orders ? ?

## 2021-10-01 NOTE — Telephone Encounter (Signed)
Meds ordered this encounter  ?Medications  ? QVAR REDIHALER 80 MCG/ACT inhaler  ?  Sig: Inhale 2 puffs into the lungs 2 (two) times daily.  ?  Dispense:  3 each  ?  Refill:  3  ? ? ?

## 2021-10-17 ENCOUNTER — Other Ambulatory Visit: Payer: Self-pay | Admitting: Family Medicine

## 2021-10-17 DIAGNOSIS — F5101 Primary insomnia: Secondary | ICD-10-CM

## 2021-10-25 ENCOUNTER — Other Ambulatory Visit: Payer: Self-pay

## 2021-10-25 ENCOUNTER — Other Ambulatory Visit: Payer: Self-pay | Admitting: Family Medicine

## 2021-10-25 DIAGNOSIS — J4541 Moderate persistent asthma with (acute) exacerbation: Secondary | ICD-10-CM

## 2021-10-25 MED ORDER — FLUTICASONE FUROATE-VILANTEROL 100-25 MCG/ACT IN AEPB: 1.0000 | INHALATION_SPRAY | Freq: Every day | RESPIRATORY_TRACT | 3 refills | Status: DC

## 2021-10-25 NOTE — Progress Notes (Signed)
Patient called requesting for a refill on Breo. Patient stated she is unable to afford the Qvar.  ?

## 2021-11-10 ENCOUNTER — Other Ambulatory Visit: Payer: Self-pay | Admitting: Gastroenterology

## 2021-11-14 ENCOUNTER — Other Ambulatory Visit: Payer: Self-pay | Admitting: Family Medicine

## 2021-11-14 DIAGNOSIS — N951 Menopausal and female climacteric states: Secondary | ICD-10-CM

## 2021-11-16 ENCOUNTER — Other Ambulatory Visit: Payer: Self-pay | Admitting: Family Medicine

## 2021-12-01 ENCOUNTER — Encounter: Payer: Self-pay | Admitting: Sports Medicine

## 2021-12-01 ENCOUNTER — Ambulatory Visit: Payer: BC Managed Care – PPO | Admitting: Sports Medicine

## 2021-12-01 DIAGNOSIS — K120 Recurrent oral aphthae: Secondary | ICD-10-CM | POA: Diagnosis not present

## 2021-12-01 DIAGNOSIS — Z113 Encounter for screening for infections with a predominantly sexual mode of transmission: Secondary | ICD-10-CM | POA: Diagnosis not present

## 2021-12-01 MED ORDER — TRIAMCINOLONE ACETONIDE 0.1 % MT PSTE: 1.0000 "application " | PASTE | Freq: Two times a day (BID) | OROMUCOSAL | 11 refills | Status: DC

## 2021-12-01 NOTE — Assessment & Plan Note (Signed)
Pleasant 51 year old female, history of frequent canker sores, currently has 1 on the right side of the tongue laterally. ?Adding topical triamcinolone dental paste. ?

## 2021-12-01 NOTE — Progress Notes (Signed)
? ? ?  Procedures performed today:   ? ?None. ? ?Independent interpretation of notes and tests performed by another provider:  ? ?None. ? ?Brief History, Exam, Impression, and Recommendations:   ? ?Canker sores oral ?Pleasant 51 year old female, history of frequent canker sores, currently has 1 on the right side of the tongue laterally. ?Adding topical triamcinolone dental paste. ? ?Screening examination for STD (sexually transmitted disease) ?Possible exposure, running all the STD tesst. ? ? ? ?___________________________________________ ?Gwen Her. Dianah Field, M.D., ABFM., CAQSM. ?Primary Care and Sports Medicine ?[place] ? ?Adjunct Instructor of Family Medicine  ?[employer] ?

## 2021-12-01 NOTE — Assessment & Plan Note (Signed)
Possible exposure, running all the STD tesst. ?

## 2021-12-02 LAB — HSV 2 ANTIBODY, IGG: HSV 2 Glycoprotein G Ab, IgG: <0.9 index

## 2021-12-02 LAB — C. TRACHOMATIS/N. GONORRHOEAE RNA
C. trachomatis RNA, TMA: NOT DETECTED
N. gonorrhoeae RNA, TMA: NOT DETECTED

## 2021-12-02 LAB — RPR: RPR Ser Ql: NONREACTIVE

## 2021-12-02 LAB — HEPATITIS PANEL, ACUTE
Hep A IgM: NONREACTIVE
Hep B C IgM: NONREACTIVE
Hepatitis B Surface Ag: NONREACTIVE
Hepatitis C Ab: NONREACTIVE
SIGNAL TO CUT-OFF: 0.18 (ref <1.00)

## 2021-12-02 LAB — HIV ANTIBODY (ROUTINE TESTING W REFLEX): HIV 1&2 Ab, 4th Generation: NONREACTIVE

## 2021-12-11 ENCOUNTER — Other Ambulatory Visit: Payer: Self-pay | Admitting: Family Medicine

## 2021-12-13 ENCOUNTER — Encounter: Payer: Self-pay | Admitting: Family Medicine

## 2021-12-13 ENCOUNTER — Ambulatory Visit (INDEPENDENT_AMBULATORY_CARE_PROVIDER_SITE_OTHER): Payer: BC Managed Care – PPO | Admitting: Family Medicine

## 2021-12-13 DIAGNOSIS — J4530 Mild persistent asthma, uncomplicated: Secondary | ICD-10-CM | POA: Diagnosis not present

## 2021-12-13 DIAGNOSIS — J329 Chronic sinusitis, unspecified: Secondary | ICD-10-CM | POA: Diagnosis not present

## 2021-12-13 DIAGNOSIS — J40 Bronchitis, not specified as acute or chronic: Secondary | ICD-10-CM

## 2021-12-13 MED ORDER — PROMETHAZINE-DM 6.25-15 MG/5ML PO SYRP: 5.0000 mL | ORAL_SOLUTION | Freq: Every evening | ORAL | 0 refills | Status: DC | PRN

## 2021-12-13 MED ORDER — DOXYCYCLINE HYCLATE 100 MG PO TABS: 100.0000 mg | ORAL_TABLET | Freq: Two times a day (BID) | ORAL | 0 refills | Status: AC

## 2021-12-13 MED ORDER — PREDNISONE 50 MG PO TABS: ORAL_TABLET | ORAL | 0 refills | Status: DC

## 2021-12-13 MED ORDER — FLUTICASONE FUROATE-VILANTEROL 200-25 MCG/ACT IN AEPB: 1.0000 | INHALATION_SPRAY | Freq: Every day | RESPIRATORY_TRACT | 11 refills | Status: DC

## 2021-12-13 NOTE — Assessment & Plan Note (Signed)
Asthma has not been well controlled with change to [facility].  We will send an increased dose of Breo to see if this is more effective for her.

## 2021-12-13 NOTE — Assessment & Plan Note (Signed)
Symptoms consistent with sinusitis and bronchitis/asthma exacerbation.  Adding prednisone burst.  Start doxycycline, as she has Augmentin and azithromycin allergies.  Promethazine DM as needed for cough.

## 2021-12-13 NOTE — Progress Notes (Signed)
Tara Howard - 51 y.o. female MRN [id number]  Date of birth: 08-22-1970  Subjective Chief Complaint  Patient presents with   URI    HPI Tara Howard is a 51 year old female here today with complaint of cough, congestion, sinus pain and pressure and wheezing.  Symptoms started a few days ago.  She has not had any fever or chills.  She has noted some shortness of breath at times.  She feels like her asthma has not been as well controlled since switching her controller inhaler.  Currently using Breo, previously on Qvar.  She is requiring her Xopenex more often currently.  ROS:  A comprehensive ROS was completed and negative except as noted per HPI  Allergies  Allergen Reactions   Azithromycin Diarrhea, Nausea And Vomiting and Other (See Comments)        Hycodan [Hydrocodone Bit-Homatrop Mbr] Other (See Comments)    Nausea   Augmentin [Amoxicillin-Pot Clavulanate] Diarrhea, Nausea And Vomiting and Other (See Comments)    Stomach cramps Did it involve swelling of the face/tongue/throat, SOB, or low BP? No Did it involve sudden or severe rash/hives, skin peeling, or any reaction on the inside of your mouth or nose? No Did you need to seek medical attention at a hospital or doctor's office? No When did it last happen? More than 3 years ago    If all above answers are "NO", may proceed with cephalosporin use.    Clonidine Derivatives Rash   Flexeril [Cyclobenzaprine] Other (See Comments)    constipation   Linzess [Linaclotide] Other (See Comments)    cramping   Oxybutynin Rash   Zolpidem Tartrate Other (See Comments)    Hallucinations    Past Medical History:  Diagnosis Date   Allergy    allergist in [place]   Anxiety    Asthma    Breast cancer ([place])    Chronic headache    Depression    Fibromyalgia    Fibromyalgia 2006   pain / headaches   GERD (gastroesophageal reflux disease)    IBS (irritable bowel syndrome)    Osteoarthritis    in her hips   Vaginismus     Past  Surgical History:  Procedure Laterality Date   bilateral mastectomy  11/01/2018   BRAVO [place] STUDY N/A 03/26/2019   Procedure: BRAVO [place];  Surgeon: Lavena Bullion, DO;  [facility];  Service: Gastroenterology;  Laterality: N/A;   BRAVO [place] STUDY N/A 06/27/2019   Procedure: BRAVO [place];  Surgeon: Lavena Bullion, DO;  [facility];  Service: Gastroenterology;  Laterality: N/A;   BREAST LUMPECTOMY Left 09/2018   BREAST RECONSTRUCTION  12/2018   CESAREAN SECTION     X 2   ENDOMETRIAL ABLATION     ESOPHAGOGASTRODUODENOSCOPY     said it was done maybe about 10 years ago. In [place]   ESOPHAGOGASTRODUODENOSCOPY (EGD) WITH PROPOFOL N/A 03/26/2019   Procedure: ESOPHAGOGASTRODUODENOSCOPY (EGD) WITH PROPOFOL;  Surgeon: Lavena Bullion, DO;  [facility];  Service: Gastroenterology;  Laterality: N/A;   ESOPHAGOGASTRODUODENOSCOPY (EGD) WITH PROPOFOL N/A 06/27/2019   Procedure: ESOPHAGOGASTRODUODENOSCOPY (EGD) WITH PROPOFOL;  Surgeon: Lavena Bullion, DO;  [facility];  Service: Gastroenterology;  Laterality: N/A;   TONSILLECTOMY     TONSILLECTOMY     TUBAL LIGATION     UPPER GASTROINTESTINAL ENDOSCOPY      Social History   Socioeconomic History   Marital status: Legally Separated    Spouse name: Not on file   Number  of children: 2   Years of education: Not on file   Highest education level: Not on file  Occupational History   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[place]: [employer]  Tobacco Use   Smoking status: Never   Smokeless tobacco: Never  Vaping Use   Vaping Use: Never used  Substance and Sexual Activity   Alcohol use: Not Currently   Drug use: Never   Sexual activity: Not Currently    Birth control/protection: Surgical  Other Topics Concern   Not on file  Social History Narrative   Not on file   Social Determinants of Health   Financial Resource Strain: Not on file  Food  Insecurity: Not on file  Transportation Needs: Not on file  Physical Activity: Not on file  Stress: Not on file  Social Connections: Not on file    Family History  Problem Relation Age of Onset   Depression Mother    Hypertension Mother    Heart attack Mother    CAD Mother        3 vessel bypass   Irritable bowel syndrome Mother    Aneurysm Mother        brain   CAD Father        3 vessel bypass   Cancer Paternal Grandmother        of the bowel per patient    Bone cancer Paternal Grandmother    Colon cancer Neg Hx    Pancreatic cancer Neg Hx    Liver disease Neg Hx    Stomach cancer Neg Hx    Esophageal cancer Neg Hx     Health Maintenance  Topic Date Due   TETANUS/TDAP  03/25/2022 (Originally 07/28/2021)   COVID-19 Vaccine (3 - Pfizer risk series) 03/25/2022 (Originally 11/19/2019)   INFLUENZA VACCINE  02/22/2022   COLONOSCOPY (Pts 45-30yr Insurance coverage will need to be confirmed)  09/19/2022   PAP SMEAR-Modifier  01/18/2026   Hepatitis C Screening  Completed   HIV Screening  Completed   Pneumococcal Vaccine 120641Years old  Aged Out   HPV VACCINES  Aged Out     ----------------------------------------------------------------------------------------------------------------------------------------------------------------------------------------------------------------- Physical Exam BP 122/83 (BP Location: Right Arm, Patient Position: Sitting, Cuff Size: Normal)   Pulse 96   Temp 98 F (36.7 C) (Oral)   Ht '5\' 4"'$  (1.626 m)   Wt 151 lb (68.5 kg)   SpO2 99%   BMI 25.92 kg/m   Physical Exam Constitutional:      Appearance: Normal appearance.  HENT:     Nose:     Comments: Bilateral maxillary sinus tenderness. Eyes:     General: No scleral icterus. Cardiovascular:     Rate and Rhythm: Normal rate and regular rhythm.  Pulmonary:     Effort: Pulmonary effort is normal.     Breath sounds: Normal breath sounds.  Musculoskeletal:     Cervical back: Neck  supple.  Neurological:     Mental Status: She is alert.  Psychiatric:        Mood and Affect: Mood normal.        Behavior: Behavior normal.    ------------------------------------------------------------------------------------------------------------------------------------------------------------------------------------------------------------------- Assessment and Plan  Sinobronchitis Symptoms consistent with sinusitis and bronchitis/asthma exacerbation.  Adding prednisone burst.  Start doxycycline, as she has Augmentin and azithromycin allergies.  Promethazine DM as needed for cough.  Mild persistent asthma without complication Asthma has not been well [facility]  We will send an increased  dose of Breo to see if this is more effective for her.   Meds ordered this encounter  Medications   predniSONE (DELTASONE) 50 MG tablet    Sig: Take 1 tab po daily x5 days.    Dispense:  5 tablet    Refill:  0   doxycycline (VIBRA-TABS) 100 MG tablet    Sig: Take 1 tablet (100 mg total) by mouth 2 (two) times daily for 10 days.    Dispense:  20 tablet    Refill:  0   promethazine-dextromethorphan (PROMETHAZINE-DM) 6.25-15 MG/5ML syrup    Sig: Take 5 mLs by mouth at bedtime as needed for cough.    Dispense:  118 mL    Refill:  0   fluticasone furoate-vilanterol (BREO ELLIPTA) 200-25 MCG/ACT AEPB    Sig: Inhale 1 puff into the lungs daily.    Dispense:  1 each    Refill:  11    No follow-ups on file.    This visit occurred during the SARS-CoV-2 public health emergency.  Safety protocols were in place, including screening questions prior to the visit, additional usage of staff PPE, and extensive cleaning of exam room while observing appropriate contact time as indicated for disinfecting solutions.

## 2021-12-13 NOTE — Patient Instructions (Addendum)
Increase breo to higher dose of 200/54mg daily. Continue xopenex as needed.  Add 5 days or prednisone.  Start 10 day course of doxycyline.   Cough syrup at bedtime as needed.

## 2022-01-07 ENCOUNTER — Telehealth (INDEPENDENT_AMBULATORY_CARE_PROVIDER_SITE_OTHER): Payer: BC Managed Care – PPO | Admitting: Physician Assistant

## 2022-01-07 ENCOUNTER — Encounter: Payer: Self-pay | Admitting: Physician Assistant

## 2022-01-07 VITALS — Ht 64.0 in | Wt 151.0 lb

## 2022-01-07 DIAGNOSIS — J4541 Moderate persistent asthma with (acute) exacerbation: Secondary | ICD-10-CM

## 2022-01-07 DIAGNOSIS — J209 Acute bronchitis, unspecified: Secondary | ICD-10-CM | POA: Diagnosis not present

## 2022-01-07 MED ORDER — MONTELUKAST SODIUM 10 MG PO TABS: 10.0000 mg | ORAL_TABLET | Freq: Every day | ORAL | 3 refills | Status: DC

## 2022-01-07 MED ORDER — PROMETHAZINE-DM 6.25-15 MG/5ML PO SYRP: 5.0000 mL | ORAL_SOLUTION | Freq: Every evening | ORAL | 0 refills | Status: DC | PRN

## 2022-01-07 MED ORDER — PREDNISONE 20 MG PO TABS: ORAL_TABLET | ORAL | 0 refills | Status: DC

## 2022-01-07 NOTE — Progress Notes (Signed)
Cough - productive - green mucous Tightness in chest  Cough never got better from end of May but coughing up mucous started 3 days ago  Will check temp and give to provider at visit, is not home at this time

## 2022-01-07 NOTE — Progress Notes (Signed)
..Virtual Visit via Telephone Note  I connected with Tara Howard on 01/07/22 at  1:00 PM EDT by telephone and verified that I am speaking with the correct person using two identifiers.  Location: Patient: home Provider: clinic  .Marland KitchenParticipating in visit:  Patient: Tara Howard Provider: Iran Planas PA-C Provider in training: Rexanne Mano PA-S   I discussed the limitations, risks, security and privacy concerns of performing an evaluation and management service by telephone and the availability of in person appointments. I also discussed with the patient that there may be a patient responsible charge related to this service. The patient expressed understanding and agreed to proceed.   History of Present Illness: Pt is a 51 yo female with asthma and history of bronchitis who calls into the clinic with productive cough and SOB for last 3 days. She is on BREO and last visit was increased. She uses Xopenex about 2 times a day. She uses zyrtec daily. On 5/22 was last seen for sinobronchitis and given doxycycline and prednisone. She felt much better until 3 days ago. No fever, chills, body aches. Tried singulair about 15 years ago but not aware of any positive or negative feedback. Cough is getting more and more persistent and clear mucus is picking up production and turning more green.  .. 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   Left ankle pain 08/29/2017   Genetic testing 09/28/2018   Malignant neoplasm of left female breast ([place]) 09/06/2018   Anxiety and depression 10/24/2018   Primary osteoarthritis of left hip 01/31/2019   Hot flash due to  medication 02/19/2019   Encounter for monitoring tamoxifen therapy 09/28/2018   Increased risk of breast cancer 11/08/2018   Foot trauma, right, initial encounter 01/29/2020   Primary osteoarthritis of first carpometacarpal joint of left hand 09/08/2020   Acute non-recurrent pansinusitis 05/24/2021   Mild persistent asthma without complication 24/03/7352   Canker sores oral 12/01/2021   Screening examination for STD (sexually transmitted disease) 12/01/2021   Sinobronchitis 12/13/2021   Resolved Ambulatory Problems    Diagnosis Date Noted   Acute maxillary sinusitis 05/28/2010   Acute bronchitis 05/21/2010   DYSPEPSIA 01/20/2010   Gastritis without bleeding 10/20/2010   Needs flu shot 03/25/2016   Acute recurrent maxillary sinusitis 08/29/2017   Past Medical History:  Diagnosis Date   Allergy    Anxiety    Asthma    Breast cancer (HCC)    Chronic headache    Depression    GERD (gastroesophageal reflux disease)    IBS (irritable bowel syndrome)    Osteoarthritis       Observations/Objective: No acute distress Normal breathing Dry to mild productive cough  .Marland Kitchen Today's Vitals   01/07/22 1154  Weight: 151 lb (68.5 kg)  Height: '5\' 4"'$  (1.626 m)   Body mass index is 25.92 kg/m.    Assessment and Plan: Marland KitchenMarland KitchenDiagnoses and all orders for this visit:  Acute bronchitis, unspecified organism -     predniSONE (DELTASONE) 20 MG tablet; Take 3 tablets for 3 days, take 2 tablets for 3 days, take 1 tablet for 4 days, take 1/2 tablet for 4 days. -  montelukast (SINGULAIR) 10 MG tablet; Take 1 tablet (10 mg total) by mouth at bedtime. -     promethazine-dextromethorphan (PROMETHAZINE-DM) 6.25-15 MG/5ML syrup; Take 5 mLs by mouth at bedtime as needed for cough.  Moderate persistent asthma with acute exacerbation -     predniSONE (DELTASONE) 20 MG tablet; Take 3 tablets for 3 days, take 2 tablets for 3 days, take 1 tablet for 4 days, take 1/2 tablet for 4 days. -     montelukast  (SINGULAIR) 10 MG tablet; Take 1 tablet (10 mg total) by mouth at bedtime. -     promethazine-dextromethorphan (PROMETHAZINE-DM) 6.25-15 MG/5ML syrup; Take 5 mLs by mouth at bedtime as needed for cough.   Symptoms consisent with allergic bronchitis Continue zyrtec/BREO/flonase Use xopenex every 3-4 hours as needed for symptoms Start prednisone taper Start singulair at bedtime  Use cough syrup as needed Consider nasal sinus rinses and blowing nose a lot in the spring allergy season No signs of bacterial infection call or follow up if symptoms worsen or persist.  Follow up with PCP in 2-3 weeks.    Follow Up Instructions:    I discussed the assessment and treatment plan with the patient. The patient was provided an opportunity to ask questions and all were answered. The patient agreed with the plan and demonstrated an understanding of the instructions.   The patient was advised to call back or seek an in-person evaluation if the symptoms worsen or if the condition fails to improve as anticipated.  I provided 10 minutes of non-face-to-face time during this encounter.   Iran Planas, PA-C

## 2022-01-11 ENCOUNTER — Ambulatory Visit: Payer: BC Managed Care – PPO | Admitting: Sports Medicine

## 2022-01-11 ENCOUNTER — Ambulatory Visit (INDEPENDENT_AMBULATORY_CARE_PROVIDER_SITE_OTHER): Payer: BC Managed Care – PPO

## 2022-01-11 DIAGNOSIS — M7062 Trochanteric bursitis, left hip: Secondary | ICD-10-CM | POA: Diagnosis not present

## 2022-01-11 DIAGNOSIS — M1612 Unilateral primary osteoarthritis, left hip: Secondary | ICD-10-CM

## 2022-01-11 MED ORDER — CELECOXIB 200 MG PO CAPS: ORAL_CAPSULE | ORAL | 2 refills | Status: DC

## 2022-01-11 NOTE — Assessment & Plan Note (Signed)
Very pleasant 51 year old female, chronic left hip pain, known left hip osteoarthritis on x-rays, pain today is lateral over the greater trochanter. She has very weak hip abduction on the left compared to the right. She is currently on prednisone, hip feels okay today but she would still like an injection. Today we did a trochanteric bursa/gluteus medius and minimus sheath injection with ultrasound guidance. We did discuss aggressive hip abductor conditioning exercises and she will do these starting in about a week. Return to see me in about 6 weeks and she should have significantly improved hip abductor strength.

## 2022-01-11 NOTE — Progress Notes (Signed)
    Procedures performed today:    Procedure: Real-time Ultrasound Guided injection of the left greater trochanter bursa Device: Samsung HS60  Verbal informed consent obtained.  Time-out conducted.  Noted no overlying erythema, induration, or other signs of local infection.  Skin prepped in a sterile fashion.  Local anesthesia: Topical Ethyl chloride.  With sterile technique and under real time ultrasound guidance: Noted normal-appearing hip abductors with mild bursitis, I injected medicine both superficial to and deep to the gluteus medius and minimus tendons and then deposited medicine underneath in the greater trochanteric bursa, 1 cc Kenalog 40, 2 cc lidocaine, 2 cc bupivacaine injected easily Completed without difficulty  Advised to call if fevers/chills, erythema, induration, drainage, or persistent bleeding.  Images permanently stored and available for review in PACS.  Impression: Technically successful ultrasound guided injection.  Independent interpretation of notes and tests performed by another provider:   None.  Brief History, Exam, Impression, and Recommendations:    Trochanteric bursitis, left hip Very pleasant 51 year old female, chronic left hip pain, known left hip osteoarthritis on x-rays, pain today is lateral over the greater trochanter. She has very weak hip abduction on the left compared to the right. She is currently on prednisone, hip feels okay today but she would still like an injection. Today we did a trochanteric bursa/gluteus medius and minimus sheath injection with ultrasound guidance. We did discuss aggressive hip abductor conditioning exercises and she will do these starting in about a week. Return to see me in about 6 weeks and she should have significantly improved hip abductor strength.    ___________________________________________ Gwen Her. Dianah Field, M.D., ABFM., CAQSM. Primary Care and [place] Instructor of [place] of [facility] of Medicine

## 2022-01-11 NOTE — Patient Instructions (Signed)
Hip Rehabilitation Protocol:  1.  Side leg raises.  3x30 with no weight, then 3x15 with 2 lb ankle weight, then 3x15 with 5 lb ankle weight 2.  Standing hip rotation.  3x30 with no weight, then 3x15 with 2 lb ankle weight, then 3x15 with 5 lb ankle weight. 3.  Side step ups.  3x30 with no weight, then 3x15 with 5 lbs in backpack, then 3x15 with 10 lbs in backpack. 

## 2022-01-13 ENCOUNTER — Other Ambulatory Visit: Payer: Self-pay | Admitting: Family Medicine

## 2022-01-13 DIAGNOSIS — F5101 Primary insomnia: Secondary | ICD-10-CM

## 2022-02-04 ENCOUNTER — Other Ambulatory Visit: Payer: Self-pay | Admitting: Family Medicine

## 2022-02-04 ENCOUNTER — Telehealth: Payer: Self-pay | Admitting: *Deleted

## 2022-02-04 MED ORDER — CICLESONIDE 160 MCG/ACT IN AERS: 1.0000 | INHALATION_SPRAY | Freq: Two times a day (BID) | RESPIRATORY_TRACT | 0 refills | Status: DC

## 2022-02-04 NOTE — Telephone Encounter (Signed)
Okay, I am going to switch her to Alvesco which is more similar to Qvar and lets see how it does.  I did not put any refills on it because I wanted to make sure that she might tolerate it before we switch.  Meds ordered this encounter  Medications   ciclesonide (ALVESCO) 160 MCG/ACT inhaler    Sig: Inhale 1 puff into the lungs 2 (two) times daily.    Dispense:  1 each    Refill:  0

## 2022-02-04 NOTE — Telephone Encounter (Signed)
Pt called and lvm stating that since she has been on Breo she has been experiencing more issues with Bronchitis.

## 2022-02-04 NOTE — Telephone Encounter (Signed)
Called pt back she stated that the Memory Dance is not working well for her. She said that it seems that she is continuously having issues with Bronchitis. She will get treated for this and it will clear up for a little while and it comes back. She said that the cough is waking her up at night and causing her to gag. The cough is more in her chest.   Pt stated that when she was on the Qvar she didn't have this experience. Pt reports that she was approved for the Qvar but it was still too expensive but it was more effective.   Pt no longer sees an allergist and she has never seen pulmonology. She doesn't have a CXR on file and doesn't wish to have any exposure due to her having had BrCa.   Will fwd to pcp.

## 2022-02-07 ENCOUNTER — Other Ambulatory Visit: Payer: Self-pay | Admitting: Family Medicine

## 2022-02-07 NOTE — Telephone Encounter (Signed)
Pt informed.  Pt expressed understanding and is agreeable.  Advised pt to call our office a few days before she is in need of a refill to let us know how she is tolerating this medication.  Charyl Bigger, CMA

## 2022-02-15 ENCOUNTER — Telehealth: Payer: Self-pay | Admitting: *Deleted

## 2022-02-15 ENCOUNTER — Other Ambulatory Visit: Payer: Self-pay | Admitting: Family Medicine

## 2022-02-15 NOTE — Telephone Encounter (Signed)
Pt called and stated that she will need an inhaler sent into her pharmacy. She hasn't been able to get the new inhaler(Ciclesonide) that would take the place of Qvar approved and the Memory Dance is just not effective at all.   She stated that she is unable to cough up what ever is in her chest and is asking that something be sent to her pharmacy.

## 2022-02-16 NOTE — Telephone Encounter (Signed)
Can we please clarify what she would like sent to her pharmacy.  If it something new does she know what is covered on her insurance plan?  I feel like I am stabbing in the dark on this 1?

## 2022-02-17 ENCOUNTER — Other Ambulatory Visit: Payer: Self-pay | Admitting: Family Medicine

## 2022-02-17 NOTE — Telephone Encounter (Signed)
Called patient, she will call her insurance company and find out what is covered and call us back.

## 2022-02-18 ENCOUNTER — Telehealth: Payer: Self-pay | Admitting: Neurology

## 2022-02-18 NOTE — Telephone Encounter (Signed)
Patient called stating [facility] did not received refill of Xopenex. It looks like it was received, they did ask for Albuterol. I let patient know insurance may not want to cover this. I advised talking to Pharmacy. Can use Good RX coupon. It looks cheaper at [facility] if she needs to use coupon, told her to let us know if this is the case and we can send it to [facility]. She will call back.

## 2022-02-20 ENCOUNTER — Other Ambulatory Visit: Payer: Self-pay | Admitting: Gastroenterology

## 2022-02-23 ENCOUNTER — Ambulatory Visit (INDEPENDENT_AMBULATORY_CARE_PROVIDER_SITE_OTHER): Payer: BC Managed Care – PPO | Admitting: Sports Medicine

## 2022-02-23 DIAGNOSIS — M7062 Trochanteric bursitis, left hip: Secondary | ICD-10-CM | POA: Diagnosis not present

## 2022-02-23 NOTE — Assessment & Plan Note (Signed)
This very pleasant 51 year old female returns, she has chronic left hip pain, she does have osteoarthritis on x-rays, also has trochanteric bursitis, she had very weak hip abductor's at the last visit, we did a trochanteric bursa/gluteus medius injection at the last visit, we also added aggressive hip abductor conditioning and she has improved considerably, she has no pain, her strength is better, she agrees to continue the conditioning indefinitely. Return to see me as needed

## 2022-02-23 NOTE — Progress Notes (Signed)
    Procedures performed today:    None.  Independent interpretation of notes and tests performed by another provider:   None.  Brief History, Exam, Impression, and Recommendations:    Trochanteric bursitis, left hip This very pleasant 51 year old female returns, she has chronic left hip pain, she does have osteoarthritis on x-rays, also has trochanteric bursitis, she had very weak hip abductor's at the last visit, we did a trochanteric bursa/gluteus medius injection at the last visit, we also added aggressive hip abductor conditioning and she has improved considerably, she has no pain, her strength is better, she agrees to continue the conditioning indefinitely. Return to see me as needed    ____________________________________________ Gwen Her. Dianah Field, M.D., ABFM., CAQSM., AME. Primary Care and Sports Medicine [place] [facility]  Adjunct Professor of [place] of [facility] of Medicine  Risk manager

## 2022-03-09 ENCOUNTER — Other Ambulatory Visit: Payer: Self-pay | Admitting: Family Medicine

## 2022-04-03 ENCOUNTER — Other Ambulatory Visit: Payer: Self-pay | Admitting: Family Medicine

## 2022-06-01 ENCOUNTER — Telehealth (INDEPENDENT_AMBULATORY_CARE_PROVIDER_SITE_OTHER): Payer: BC Managed Care – PPO | Admitting: Family Medicine

## 2022-06-01 ENCOUNTER — Encounter: Payer: Self-pay | Admitting: Family Medicine

## 2022-06-01 VITALS — Temp 97.2°F | Wt 146.0 lb

## 2022-06-01 DIAGNOSIS — U071 COVID-19: Secondary | ICD-10-CM | POA: Insufficient documentation

## 2022-06-01 HISTORY — DX: COVID-19: U07.1

## 2022-06-01 MED ORDER — METHYLPREDNISOLONE 4 MG PO TBPK: ORAL_TABLET | ORAL | 0 refills | Status: DC

## 2022-06-01 MED ORDER — NIRMATRELVIR/RITONAVIR (PAXLOVID) TABLET (RENAL DOSING): 2.0000 | ORAL_TABLET | Freq: Two times a day (BID) | ORAL | 0 refills | Status: AC

## 2022-06-01 NOTE — Assessment & Plan Note (Signed)
-   pt's signs and symptoms with positive COVID 19 home test are covid. Likely sinus pain/pressure and congestion is due to this - due to increased use of inhaler have given medrol dose pack to help decrease inflammation - have also prescribed paxlovid and discussed patient taking it.  - there is NO updated GFR In our computer system and most recent was in 2018. Have sent in the renal dosing option for paxlovid since I don't have any gfr and do not want patient having renal issues.

## 2022-06-01 NOTE — Progress Notes (Signed)
Acute Office Visit I connected with  Tara Howard on 06/01/22 by a video and audio enabled telemedicine application and verified that I am speaking with the correct person using two identifiers.  Patient Location: Home  Provider Location: Office/Clinic  I discussed the limitations of evaluation and management by telemedicine. The patient expressed understanding and agreed to proceed.   Subjective:     Patient ID: Tara Howard, female    DOB: 1970-08-03, 51 y.o.   MRN: [id number]  Chief Complaint  Patient presents with   Covid Positive    HPI Patient is being evaluated today via virtual visit for covid-19. She took a home test and tested positive on Monday, 05/30/22. She notes sinus congestion. She has had to increase her inhaler use.   Review of Systems  Constitutional:  Negative for chills and fever.  HENT:  Positive for sinus pain.   Respiratory:  Positive for cough. Negative for shortness of breath.   Cardiovascular:  Negative for chest pain.  Neurological:  Negative for headaches.        Objective:    Temp (!) 97.2 F (36.2 C)   Wt 146 lb (66.2 kg)   BMI 25.06 kg/m    Physical Exam Vitals and nursing note reviewed.  Constitutional:      General: She is not in acute distress.    Appearance: Normal appearance.  HENT:     Head: Normocephalic and atraumatic.     Right Ear: External ear normal.     Left Ear: External ear normal.     Nose: Nose normal.  Eyes:     Conjunctiva/sclera: Conjunctivae normal.  Pulmonary:     Effort: Pulmonary effort is normal.     Breath sounds: Normal breath sounds.  Neurological:     General: No focal deficit present.     Mental Status: She is alert and oriented to person, place, and time.  Psychiatric:        Mood and Affect: Mood normal.        Behavior: Behavior normal.        Thought Content: Thought content normal.        Judgment: Judgment normal.     No results found for any visits on 06/01/22.       Assessment & Plan:   Problem List Items Addressed This Visit       Other   COVID-19 - Primary    - pt's signs and symptoms with positive COVID 19 home test are covid. Likely sinus pain/pressure and congestion is due to this - due to increased use of inhaler have given medrol dose pack to help decrease inflammation - have also prescribed paxlovid and discussed patient taking it.  - there is NO updated GFR In our computer system and most recent was in 2018. Have sent in the renal dosing option for paxlovid since I don't have any gfr and do not want patient having renal issues.       Relevant Medications   nirmatrelvir/ritonavir EUA, renal dosing, (PAXLOVID) 10 x 150 MG & 10 x '100MG'$  TABS    Meds ordered this encounter  Medications   nirmatrelvir/ritonavir EUA, renal dosing, (PAXLOVID) 10 x 150 MG & 10 x '100MG'$  TABS    Sig: Take 2 tablets by mouth 2 (two) times daily for 5 days. (Take nirmatrelvir 150 mg one tablet twice daily for 5 days and ritonavir 100 mg one tablet twice daily for 5 days) Patient GFR is unknown    Dispense:  20 tablet    Refill:  0   methylPREDNISolone (MEDROL DOSEPAK) 4 MG TBPK tablet    Sig: Follow instructions on dose pack    Dispense:  21 tablet    Refill:  0     [phone] fax number  No follow-ups on file.  Owens Loffler, DO

## 2022-06-03 ENCOUNTER — Telehealth: Payer: Self-pay

## 2022-06-03 NOTE — Telephone Encounter (Signed)
Patient called and left voice  mail message States she had video visit  for side effects of COVID She states she is still having s/e of being very dizzy.  She has to go back to work on Monday 06/06/22 - she is wanting to know if there is any med she can take fo the dizziness?

## 2022-06-03 NOTE — Telephone Encounter (Signed)
Nondrowsy Dramamine.  The name of the chemical is meclizine.  That can be used for dizziness.

## 2022-06-22 ENCOUNTER — Encounter: Payer: Self-pay | Admitting: Obstetrics & Gynecology

## 2022-06-22 ENCOUNTER — Ambulatory Visit: Payer: BC Managed Care – PPO | Admitting: Obstetrics & Gynecology

## 2022-06-22 ENCOUNTER — Other Ambulatory Visit (HOSPITAL_COMMUNITY)
Admission: RE | Admit: 2022-06-22 | Discharge: 2022-06-22 | Disposition: A | Discharge status: Home / Self Care | Payer: BC Managed Care – PPO | Source: Ambulatory Visit | Attending: Obstetrics & Gynecology | Admitting: Obstetrics & Gynecology

## 2022-06-22 VITALS — BP 134/86 | HR 101 | Ht 64.0 in | Wt 151.0 lb

## 2022-06-22 DIAGNOSIS — B3731 Acute candidiasis of vulva and vagina: Secondary | ICD-10-CM

## 2022-06-22 DIAGNOSIS — N760 Acute vaginitis: Secondary | ICD-10-CM

## 2022-06-22 DIAGNOSIS — Z124 Encounter for screening for malignant neoplasm of cervix: Secondary | ICD-10-CM | POA: Insufficient documentation

## 2022-06-22 MED ORDER — NYSTATIN-TRIAMCINOLONE 100000-0.1 UNIT/GM-% EX CREA: 1.0000 | TOPICAL_CREAM | Freq: Two times a day (BID) | CUTANEOUS | 1 refills | Status: DC

## 2022-06-22 NOTE — Progress Notes (Signed)
GYNECOLOGY OFFICE VISIT NOTE  History:   Tara Howard is a 51 y.o. [id number] here today for evaluation of vulvar irritation for two weeks.  Has been using new vaginal soap (Lume) and also feels toilet paper is irritating.  Some white discharge noted.  She denies any abnormal vaginal  bleeding, pelvic pain or other concerns.  Of note, she has history of left breast cancer diagnosed in 2020, post bilateral mastectomy, prophylactic on the right >> no screening mammograms needed as per patient.    Past Medical History:  Diagnosis Date   Allergy    allergist in [place]   Anxiety    Asthma    Breast cancer ([place])    Chronic headache    Depression    Fibromyalgia    Fibromyalgia 2006   pain / headaches   GERD (gastroesophageal reflux disease)    IBS (irritable bowel syndrome)    Osteoarthritis    in her hips   Vaginismus     Past Surgical History:  Procedure Laterality Date   bilateral mastectomy  11/01/2018   BRAVO [place] STUDY N/A 03/26/2019   Procedure: BRAVO [place] STUDY;  Surgeon: Lavena Bullion, DO;  Location: [facility];  Service: Gastroenterology;  Laterality: N/A;   BRAVO [place] STUDY N/A 06/27/2019   Procedure: BRAVO [place];  Surgeon: Lavena Bullion, DO;  Location: [facility];  Service: Gastroenterology;  Laterality: N/A;   BREAST LUMPECTOMY Left 09/2018   BREAST RECONSTRUCTION  12/2018   CESAREAN SECTION     X 2   ENDOMETRIAL ABLATION     ESOPHAGOGASTRODUODENOSCOPY     said it was done maybe about 10 years ago. In [place]   ESOPHAGOGASTRODUODENOSCOPY (EGD) WITH PROPOFOL N/A 03/26/2019   Procedure: ESOPHAGOGASTRODUODENOSCOPY (EGD) WITH PROPOFOL;  Surgeon: Lavena Bullion, DO;  Location: [facility];  Service: Gastroenterology;  Laterality: N/A;   ESOPHAGOGASTRODUODENOSCOPY (EGD) WITH PROPOFOL N/A 06/27/2019   Procedure: ESOPHAGOGASTRODUODENOSCOPY (EGD) WITH PROPOFOL;  Surgeon: Lavena Bullion, DO;  Location: [facility];  Service: Gastroenterology;  Laterality: N/A;    TONSILLECTOMY     TONSILLECTOMY     TUBAL LIGATION     UPPER GASTROINTESTINAL ENDOSCOPY      The following portions of the patient's history were reviewed and updated as appropriate: allergies, current medications, past family history, past medical history, past social history, past surgical history and problem list.   Health Maintenance:  Normal pap and negative HRHPV on 01/18/2021.   Review of Systems:  Pertinent items noted in HPI and remainder of comprehensive ROS otherwise negative.  Physical Exam:  BP 134/86   Pulse (!) 101   Ht '5\' 4"'$  (1.626 m)   Wt 151 lb (68.5 kg)   BMI 25.92 kg/m  CONSTITUTIONAL: Well-developed, well-nourished female in no acute distress.  HEENT:  Normocephalic, atraumatic. External right and left ear normal. No scleral icterus.  NECK: Normal range of motion, supple, no masses noted on observation SKIN: No rash noted. Not diaphoretic. No erythema. No pallor. MUSCULOSKELETAL: Normal range of motion. No edema noted. NEUROLOGIC: Alert and oriented to person, place, and time. Normal muscle tone coordination. No cranial nerve deficit noted. PSYCHIATRIC: Normal mood and affect. Normal behavior. Normal judgment and thought content. CARDIOVASCULAR: Normal heart rate noted RESPIRATORY: Effort and breath sounds normal, no problems with respiration noted ABDOMEN: No masses noted. No other overt distention noted.   PELVIC: Erythematous and inflammed bilateral labia, no lesions seen.  Otherwise normal appearing external genitalia; normal urethral meatus; normal appearing vaginal mucosa  and cervix. White discharge noted, testing sample obtained.  Performed in the presence of a chaperone     Assessment and Plan:    1. Pap smear for cervical cancer screening Patient desired yearly pap smear, this was done.  Will follow up results and manage accordingly. - Cytology - PAP( [place])  2. Vulvovaginitis Could be contact dermatitis, but concerned about possible skin  yeast infection. Mycolog prescribed for now. Will follow up discharge testing. - Cervicovaginal ancillary only( [place]) - nystatin-triamcinolone (MYCOLOG II) cream; Apply 1 Application topically 2 (two) times daily.  Dispense: 30 g; Refill: 1 Proper vulvar hygiene emphasized: discussed avoidance of perfumed soaps, detergents, lotions and any type of douches; in addition to wearing cotton underwear and no underwear at night.  Also recommended cleaning front to back, voiding and cleaning up after intercourse.    Routine preventative health maintenance measures emphasized. Please refer to After Visit Summary for other counseling recommendations.   Return for any gynecologic concerns.    I spent 25 minutes dedicated to the care of this patient including pre-visit review of records, face to face time with the patient discussing her conditions and treatments and post visit orders.    Verita Schneiders, MD, [place] for [employer], [place]

## 2022-06-23 LAB — CERVICOVAGINAL ANCILLARY ONLY
Bacterial Vaginitis (gardnerella): NEGATIVE
Candida Glabrata: NEGATIVE
Candida Vaginitis: POSITIVE — AB
Comment: NEGATIVE
Comment: NEGATIVE
Comment: NEGATIVE
Comment: NEGATIVE
Trichomonas: NEGATIVE

## 2022-06-23 MED ORDER — FLUCONAZOLE 150 MG PO TABS: 150.0000 mg | ORAL_TABLET | ORAL | 3 refills | Status: DC

## 2022-06-23 NOTE — Addendum Note (Signed)
Addended by: Verita Schneiders A on: 06/23/2022 08:08 PM   Modules accepted: Orders

## 2022-06-24 LAB — CYTOLOGY - PAP
Comment: NEGATIVE
Diagnosis: NEGATIVE
High risk HPV: NEGATIVE

## 2022-07-07 ENCOUNTER — Telehealth: Payer: Self-pay | Admitting: Gastroenterology

## 2022-07-07 NOTE — Telephone Encounter (Signed)
Patient is calling states she has been constipated for over 3 weeks now and nothing is working, she is leaving the country on Monday and is looking for advise on what to do before then. Please advise

## 2022-07-08 NOTE — Telephone Encounter (Signed)
Pt reports she has tried milk of magnesia and enemas for constipation and she has passed a small amount of stool. Pt also reports she takes miralax 2 times per day and drinks at least 64 ounces of water per day. Gave pt miralax purge instructions and let pt know she can complete purge twice if she doesn't have adequate results. Pt verbalized understanding.

## 2022-07-21 ENCOUNTER — Other Ambulatory Visit: Payer: Self-pay | Admitting: Family Medicine

## 2022-07-21 DIAGNOSIS — F5101 Primary insomnia: Secondary | ICD-10-CM

## 2022-07-26 ENCOUNTER — Ambulatory Visit: Payer: BC Managed Care – PPO | Admitting: Family Medicine

## 2022-07-26 ENCOUNTER — Encounter: Payer: Self-pay | Admitting: Family Medicine

## 2022-07-26 VITALS — BP 129/82 | HR 109 | Ht 64.0 in | Wt 152.0 lb

## 2022-07-26 DIAGNOSIS — J22 Unspecified acute lower respiratory infection: Secondary | ICD-10-CM

## 2022-07-26 MED ORDER — DOXYCYCLINE HYCLATE 100 MG PO TABS: 100.0000 mg | ORAL_TABLET | Freq: Two times a day (BID) | ORAL | 0 refills | Status: DC

## 2022-07-26 NOTE — Progress Notes (Signed)
   Established Patient Office Visit  Subjective   Patient ID: Tara Howard, female    DOB: [birth year]  Age: 52 y.o. MRN: [id number]  Chief Complaint  Patient presents with   Bronchitis    HPI  She reports that she has been sick since last Friday, approximately 5 days.  She said pretty quickly she started with an aggressive cough with green chunky sputum.  She had been traveling to [place] and as soon as she got back into town and started not feeling well.  No fevers chills or sweats.  She has had a little bit of sinus drainage but no major congestion but she has had some sinus pain and right ear pain.  She had a round of prednisone that she had been given back in November which she ended up not needing so she actually started that on Saturday and has been taking that consistently.  No GI symptoms.  She did have COVID 2 months ago so did not retest for COVID this time.  Using her albuterol as needed.    ROS    Objective:     BP 129/82   Pulse (!) 109   Ht '5\' 4"'$  (1.626 m)   Wt 152 lb (68.9 kg)   SpO2 98%   BMI 26.09 kg/m    Physical Exam Constitutional:      Appearance: Normal appearance. She is well-developed.  HENT:     Head: Normocephalic and atraumatic.     Right Ear: Tympanic membrane, ear canal and external ear normal.     Left Ear: Tympanic membrane, ear canal and external ear normal.     Nose: Nose normal.     Mouth/Throat:     Pharynx: Oropharynx is clear.  Eyes:     Conjunctiva/sclera: Conjunctivae normal.     Pupils: Pupils are equal, round, and reactive to light.  Neck:     Thyroid: No thyromegaly.  Cardiovascular:     Rate and Rhythm: Normal rate and regular rhythm.     Heart sounds: Normal heart sounds.  Pulmonary:     Effort: Pulmonary effort is normal.     Breath sounds: Normal breath sounds. No wheezing.  Musculoskeletal:     Cervical back: Neck supple.  Lymphadenopathy:     Cervical: No cervical adenopathy.  Skin:    General: Skin is warm and  dry.  Neurological:     Mental Status: She is alert and oriented to person, place, and time.  Psychiatric:        Mood and Affect: Mood normal.      No results found for any visits on 07/26/22.    The ASCVD Risk score (Arnett DK, et al., 2019) failed to calculate for the following reasons:   Cannot find a previous HDL lab   Cannot find a previous total cholesterol lab    Assessment & Plan:   Problem List Items Addressed This Visit   None Visit Diagnoses     Lower resp. tract infection    -  Primary      Respiratory tract infection-we will go ahead and treat with doxycycline continue prednisone.  Continue as needed albuterol.  If not improving over the next week then please let me know.  No follow-ups on file.    Beatrice Lecher, MD

## 2022-08-01 ENCOUNTER — Telehealth: Payer: Self-pay | Admitting: Gastroenterology

## 2022-08-01 NOTE — Telephone Encounter (Signed)
Patient calling to speak with someone in regards to her IBS.  Please advise.

## 2022-08-01 NOTE — Telephone Encounter (Signed)
Pt has been constipated recently. Pt states she is taking lactulose and is able to have a bowel movement with the lactulose but she has to strain to have a bowel movement and feels like she is not emptying out completely. Pt feels like she needs to take a prescription daily and wants to try something else for constipation. Pt reports she feels bloated and has occasional abdominal pain. Pt scheduled for f/u on 08/24/22 with Dr. Bryan Lemma.

## 2022-08-02 ENCOUNTER — Other Ambulatory Visit: Payer: Self-pay

## 2022-08-02 NOTE — Telephone Encounter (Signed)
I recommend starting Amitiza 24 mcg twice daily.  Please again explained to her that is very common to have diarrhea in the first 7-10 days after starting this medication, but I would urge her to continue the medication.  Can also have abdominal cramping/discomfort in the first 7-10 days.  Continue adequate hydration with at least 64 ounces of water daily.  Please confirm that she is still taking Effexor and nortriptyline as she was in 07/2021.  Can send in Rx for Bentyl 10 mg every 6 hours as needed for abdominal cramping, #60, RF 3.  To follow-up in the GI clinic as scheduled.

## 2022-08-03 MED ORDER — DICYCLOMINE HCL 10 MG PO CAPS: 10.0000 mg | ORAL_CAPSULE | Freq: Four times a day (QID) | ORAL | 3 refills | Status: DC | PRN

## 2022-08-03 MED ORDER — LUBIPROSTONE 24 MCG PO CAPS: 24.0000 ug | ORAL_CAPSULE | Freq: Two times a day (BID) | ORAL | 3 refills | Status: DC

## 2022-08-03 NOTE — Telephone Encounter (Signed)
Spoke with pt and gave pt recommendations. Pt verbalized understanding and confirmed she is still taking effexor and nortriptyline. Pt wanted to know if she needs to continue or stop taking lactulose and miralax while taking amitiza.  Pt requested that I leave a detailed voicemail if she is unable to answer phone.

## 2022-08-03 NOTE — Telephone Encounter (Signed)
Spoke with pt and gave her recommendations. Pt verbalized understanding.  

## 2022-08-03 NOTE — Telephone Encounter (Signed)
Left message for pt to call back  °

## 2022-08-05 ENCOUNTER — Other Ambulatory Visit: Payer: Self-pay

## 2022-08-05 MED ORDER — RABEPRAZOLE SODIUM 20 MG PO TBEC: 20.0000 mg | DELAYED_RELEASE_TABLET | Freq: Two times a day (BID) | ORAL | 0 refills | Status: DC

## 2022-08-09 ENCOUNTER — Telehealth: Payer: Self-pay | Admitting: *Deleted

## 2022-08-09 DIAGNOSIS — J4541 Moderate persistent asthma with (acute) exacerbation: Secondary | ICD-10-CM

## 2022-08-09 DIAGNOSIS — J209 Acute bronchitis, unspecified: Secondary | ICD-10-CM

## 2022-08-09 MED ORDER — METHYLPREDNISOLONE 4 MG PO TBPK: ORAL_TABLET | ORAL | 0 refills | Status: DC

## 2022-08-09 NOTE — Telephone Encounter (Signed)
Pt called and stated that she is still coughing. She was seen on 07/26/22 for lower resp tract inf. She was given doxy and prednisone. She has finished these and is out of the cough syrup. She said that she did get better but the cough has come back and wanted to know if Dr. Madilyn Fireman would send over some more prednisone for her to take. She denies any f/s/c/n/v body aches.  [facility] confirmed.

## 2022-08-09 NOTE — Telephone Encounter (Signed)
Meds. Sent. If not better in one week, needs appt

## 2022-08-09 NOTE — Telephone Encounter (Signed)
error 

## 2022-08-09 NOTE — Telephone Encounter (Signed)
Pt called and stated that she is still coughing. She was seen on 07/26/22 for lower resp tract inf. She was given doxy and prednisone. She has finished these and is out of the cough syrup. She said that she did get better but the cough has come back and wanted to know if Dr. Madilyn Fireman would send over some more prednisone for her to take. She denies any f/s/c/n/v body aches.  [facility].

## 2022-08-10 MED ORDER — HYDROCOD POLI-CHLORPHE POLI ER 10-8 MG/5ML PO SUER: 5.0000 mL | Freq: Two times a day (BID) | ORAL | 0 refills | Status: DC | PRN

## 2022-08-10 NOTE — Telephone Encounter (Signed)
Meds ordered this encounter  Medications   chlorpheniramine-HYDROcodone (TUSSIONEX) 10-8 MG/5ML    Sig: Take 5 mLs by mouth every 12 (twelve) hours as needed for cough.    Dispense:  70 mL    Refill:  0

## 2022-08-10 NOTE — Telephone Encounter (Signed)
Rx sent earlier today.

## 2022-08-10 NOTE — Telephone Encounter (Signed)
Lvm advising pt of cough syrup sent to her pharmacy.

## 2022-08-11 ENCOUNTER — Other Ambulatory Visit: Payer: Self-pay | Admitting: Gastroenterology

## 2022-08-11 ENCOUNTER — Ambulatory Visit (INDEPENDENT_AMBULATORY_CARE_PROVIDER_SITE_OTHER): Payer: BC Managed Care – PPO | Admitting: Family Medicine

## 2022-08-11 ENCOUNTER — Ambulatory Visit (INDEPENDENT_AMBULATORY_CARE_PROVIDER_SITE_OTHER): Payer: BC Managed Care – PPO

## 2022-08-11 ENCOUNTER — Telehealth: Payer: Self-pay

## 2022-08-11 VITALS — BP 143/94 | HR 103 | Ht 64.0 in | Wt 155.0 lb

## 2022-08-11 DIAGNOSIS — R051 Acute cough: Secondary | ICD-10-CM

## 2022-08-11 MED ORDER — ALBUTEROL SULFATE (2.5 MG/3ML) 0.083% IN NEBU
2.5000 mg | INHALATION_SOLUTION | Freq: Once | RESPIRATORY_TRACT | Status: AC
  Administered 2022-08-11: 2.5 mg via RESPIRATORY_TRACT

## 2022-08-11 MED ORDER — FLUTICASONE FUROATE-VILANTEROL 200-25 MCG/ACT IN AEPB: 1.0000 | INHALATION_SPRAY | Freq: Every day | RESPIRATORY_TRACT | 3 refills | Status: DC

## 2022-08-11 MED ORDER — BENZONATATE 200 MG PO CAPS: 200.0000 mg | ORAL_CAPSULE | Freq: Two times a day (BID) | ORAL | 0 refills | Status: DC | PRN

## 2022-08-11 NOTE — Telephone Encounter (Signed)
We can get her scheduled for an office visit if she needs a breathing treatment.  Not sure if we have any availability this afternoon.  Or we can even put her on the nurse schedule and get an albuterol neb and have someone take a listen to her chest while she is here.

## 2022-08-11 NOTE — Telephone Encounter (Signed)
Patient informed and transferred her call to [facility] for scheduling

## 2022-08-11 NOTE — Telephone Encounter (Signed)
Nurse appt scheduled with patient.

## 2022-08-11 NOTE — Progress Notes (Signed)
I was asked to see Tara Howard today after her nebulizer treatment.  She has had respiratory infection for a couple weeks with continued cough.  Given butyryl nebulizer treatment in clinic today without significant improvement.  She she is on second day of steroids.  Completed doxycycline previously.  She is using her rescue inhaler more often.  She has not had a chest x-ray.  Exam: Mild expiratory wheezing however fairly good air movement.  Plan: Two-view chest x-ray.  Breo changed to 200/25 mg.  Tessalon Perles added.

## 2022-08-11 NOTE — Telephone Encounter (Signed)
Spoke with patient.  She states she has a severe cough even with shallow breathing,  states OTC daytime cough medications are not helping at all, and hycodan can only be taken at night due to sleepiness s/e and only helps somewhat. She feels this is bronchial related. She feels she may need a breathing treatment.   I suggested that she get an appointment for evaluation of this and she states she does not want to schedule an appointment if it can be avoided.   She states she already sent over one message this morning regarding this and would like a response today.

## 2022-08-11 NOTE — Telephone Encounter (Signed)
Patient informed. Transferred patient by phone to [facility] to schedule the appointment.

## 2022-08-11 NOTE — Telephone Encounter (Signed)
Patient called and LVM this morning stating cough is still bad, she can only take shallow breaths without having a coughing fit and she is getting no relief from cough medication. She is wondering if she can come for a breathing treatment today? Please advise.

## 2022-08-11 NOTE — Telephone Encounter (Signed)
This, okay to place on her schedule.  Okay to give albuterol 2.5 mg nebulizer treatment.  Immediately can have somebody listen to her chest as well.

## 2022-08-11 NOTE — Progress Notes (Signed)
   Subjective:    Patient ID: Tara Howard, female    DOB: 05-Sep-1970, 52 y.o.   MRN: [id number]  HPI Pt here for nebulizer treatment   Review of Systems     Objective:   Physical Exam        Assessment & Plan:   Pt completed treatment and states she is still having a headache and fells like she can't take a full breath, pt seen by pcp.

## 2022-08-11 NOTE — Telephone Encounter (Signed)
We can get her scheduled for an office visit if she needs a breathing treatment.  Not sure if we have any availability this afternoon.  Or we can even put her on the nurse schedule and get an albuterol neb and have someone take a listen to her chest while she is here.    Please help patient in scheduling a nurse visit as suggested above by dr. Suzi Roots. I will transfer her call to your office front desk.

## 2022-08-11 NOTE — Telephone Encounter (Signed)
Patient informed. 

## 2022-08-15 NOTE — Telephone Encounter (Signed)
Patient calling to give an update on mediation Amitiza and also has some bowl changes she is experiencing . Please advise.

## 2022-08-15 NOTE — Telephone Encounter (Signed)
Pt reports that she feels like Amitiza is helping but she still feels constipated. Pt states that it is still painful to have a bowel movement. She has added miralax twice daily and stool softeners the last 3 days and reports that it hasn't really made a difference.

## 2022-08-15 NOTE — Telephone Encounter (Signed)
Sounds like she has had a suboptimal response to appropriate trial of Amitiza.  Plan for the following:  - Start Motegrity 2 mg daily - Can stop taking the Amitiza when starting the [place] - Stop taking MiraLAX and any other laxatives when initiating the [place].  If needed, can reintroduce after 7-10 days

## 2022-08-16 ENCOUNTER — Other Ambulatory Visit: Payer: Self-pay | Admitting: Gastroenterology

## 2022-08-16 ENCOUNTER — Other Ambulatory Visit: Payer: Self-pay

## 2022-08-16 MED ORDER — MOTEGRITY 2 MG PO TABS: 1.0000 | ORAL_TABLET | Freq: Every day | ORAL | 11 refills | Status: DC

## 2022-08-16 NOTE — Telephone Encounter (Signed)
Spoke with pt and let pt know recommendations. Pt verbalized understanding. Motegrity sent to pt's pharmacy.

## 2022-08-18 ENCOUNTER — Other Ambulatory Visit (HOSPITAL_COMMUNITY): Payer: Self-pay

## 2022-08-18 ENCOUNTER — Telehealth: Payer: Self-pay | Admitting: Pharmacy Technician

## 2022-08-18 NOTE — Telephone Encounter (Signed)
PA has been submitted and telephone encounter has been created 

## 2022-08-18 NOTE — Telephone Encounter (Signed)
Patient Advocate Encounter  Received notification from [facility] that prior authorization for MOTEGRITY '2MG'$  is required.   PA submitted on 1.25.24 Key [id number] Status is pending

## 2022-08-24 ENCOUNTER — Telehealth: Payer: Self-pay

## 2022-08-24 ENCOUNTER — Ambulatory Visit: Payer: BC Managed Care – PPO | Admitting: Gastroenterology

## 2022-08-24 ENCOUNTER — Encounter: Payer: Self-pay | Admitting: Gastroenterology

## 2022-08-24 VITALS — BP 118/70 | HR 102 | Ht 63.75 in | Wt 151.0 lb

## 2022-08-24 DIAGNOSIS — Z1211 Encounter for screening for malignant neoplasm of colon: Secondary | ICD-10-CM | POA: Diagnosis not present

## 2022-08-24 DIAGNOSIS — R09A2 Foreign body sensation, throat: Secondary | ICD-10-CM

## 2022-08-24 DIAGNOSIS — R12 Heartburn: Secondary | ICD-10-CM | POA: Diagnosis not present

## 2022-08-24 DIAGNOSIS — K581 Irritable bowel syndrome with constipation: Secondary | ICD-10-CM

## 2022-08-24 MED ORDER — NA SULFATE-K SULFATE-MG SULF 17.5-3.13-1.6 GM/177ML PO SOLN: 1.0000 | Freq: Once | ORAL | 0 refills | Status: AC

## 2022-08-24 NOTE — Progress Notes (Signed)
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   1) Hx of IBS-C.  Index sxs were constipation predominant. Has a long hx of previously diagnosed IBS-C, which was well controlled with dietary mods, high fiber diet, and Miralax. Intermittent generalized abdominal cramping and abdominal bloating w/ visible abdominal distension. No n/v/f/c. Previously tried Linzess, and stopped due to diarrhea and abdominal pain after a few days.  Trialed Amitiza in 08/2018, but not effective (although may have only tried the 8 MCG dose).  Started on Trulance, which initially worked, but reverted back to constipation.  Sitz marker study with retained stool but no retained markers in 02/2019.  Trialed MiraLAX with good response, but increasing symptoms in 05/2022.   Responded to purge prep, then started higher dose Amitiza 24 mcg bid. Otherwise, no hematochezia, melena.   2) Esophageal hypersensitivity/heartburn: Longstanding history of heartburn and regurgitation, treated with Nexium for years, daily with occassional need for 2nd dose for breakthrough sxs. Worsening sxs in 2020 with increasing breakthrough sxs. EGD in 2012 was only n/f gastritis and esophagitis per patient. No dysphagia or odynophagia, and no early satiety.   For ongoing reflux, started on Dexilant, which was initially quite effective, eventually lost efficacy.  EGD essentially normal in 08/2018.  Diagnosed with esophageal hypersensitivity on EGD with Bravo in 06/2019.  No improvement with FD guard.  No change with high-dose Protonix.  Started nortriptyline ([name]) and titrated up to 50 mg qhs with good clinical response.  Trialed Aciphex in 07/2021.   Endoscopic Hx: - EGD (09/2010): Erythema in antrum compatible with gastritis. Otherwise normal. No actual report in EMR for review.  - EGD (08/2018, Dr.  Bryan Lemma): Normal -Colonoscopy (08/2018, Dr. Bryan Lemma): 2 small benign polyps in transverse colon, suboptimal prep.  Biopsies negative for [facility] -EGD with Bravo (03/2019, Dr. Bryan Lemma): Gastric inlet patch, regular Z-line, normal stomach/duodenum.  Hill grade 2.  Bravo attached but failed communication with recorder -EGD with Bravo (06/2019, Dr. Bryan Lemma): Gastric inlet patch, regular Z-line, normal stomach/duodenum.  Hill grade 2.  Bravo placed -Bravo (06/2019): Normal, [place] 3.4.  Good symptom correlation for heartburn with reflux.  Normal number of reflux events.  Consistent with esophageal hypersensitivity.  HPI:     Patient is a 52 y.o. female presenting to the Gastroenterology Clinic for follow-up.  Last seen by me in the office on 08/13/2021.  IBS-C was largely controlled at that time with MiraLAX and Effexor, but worsening symptoms in 05/2022 which did not improve with MOM, enemas, and increasing MiraLAX to bid.  Responded to purge prep and restarted Amitiza 24 mcg  bid.  She was also prescribed Motegrity 2 mg daily earlier this month, which has been approved by her insurance, but not yet started as she would like to give the Amitiza to try to work.  She reports that she has been having episodic constipation with overflow diarrhea over the last 12 months or so despite continued Miralax daily, adequate hydration, and healthy eating habits.   She also had bronchitis in December and was prescribed Tussionex (hydrocodone) which did have an effect on her bowel habits.  She completed that cough medicine earlier this month.  Was using MiraLAX with the Amitiza to try to combat some of the codeine side effect.  Stopped taking MiraLAX 2 days ago after completion of cough medicine.  Now on Amitiza 24 mcg bid alone  for her constipation.  Otherwise, had been doing ok on Aciphex BID and Effexor, but then with bronchitis in December so her throat clearing was worse during that.    Review of systems:      No chest pain, no SOB, no fevers, no urinary sx   Past Medical History:  Diagnosis Date   Allergy    allergist in [place]   Anxiety    Asthma    Breast cancer ([place])    Chronic headache    Depression    Fibromyalgia    Fibromyalgia 2006   pain / headaches   GERD (gastroesophageal reflux disease)    IBS (irritable bowel syndrome)    Osteoarthritis    in her hips   Vaginismus     Patient's surgical history, family medical history, social history, medications and allergies were all reviewed in Epic    Current Outpatient Medications  Medication Sig Dispense Refill   acetaminophen (TYLENOL) 650 MG CR tablet Take 1-2 tablets (650-1,300 mg total) by mouth in the morning and at bedtime. 90 tablet 3   celecoxib (CELEBREX) 200 MG capsule One to 2 tablets by mouth daily as needed for pain. 180 capsule 2   chlorpheniramine-HYDROcodone (TUSSIONEX) 10-8 MG/5ML Take 5 mLs by mouth every 12 (twelve) hours as needed for cough. 70 mL 0   Cholecalciferol (DIALYVITE VITAMIN D 5000) 125 MCG (5000 UT) capsule Take 5,000 Units by mouth daily.     fluticasone (FLONASE) 50 MCG/ACT nasal spray Place 1 spray into both nostrils daily.     fluticasone furoate-vilanterol (BREO ELLIPTA) 200-25 MCG/ACT AEPB Inhale 1 puff into the lungs daily. 1 each 3   gabapentin (NEURONTIN) 300 MG capsule Take 300 mg by mouth 3 (three) times daily.     levalbuterol (XOPENEX HFA) 45 MCG/ACT inhaler INHALE 2 PUFFS INTO THE LUNGS EVERY 6 HOURS AS NEEDED FOR WHEEZING OR SHORTNESS OF BREATH 15 each 5   lubiprostone (AMITIZA) 24 MCG capsule Take 24 mcg by mouth 2 (two) times daily with a meal.     montelukast (SINGULAIR) 10 MG tablet Take 1 tablet (10 mg total) by mouth at bedtime. 90 tablet 3   Multiple Vitamins-Minerals (HAIR SKIN AND NAILS FORMULA PO) Take 2 tablets by mouth daily.     nortriptyline ([name]) 50 MG capsule Take 1 capsule (50 mg total) by mouth at bedtime. 90 capsule 3   polyethylene glycol (MIRALAX / GLYCOLAX) 17  g packet Take 17 g by mouth daily as needed.     QULIPTA 60 MG TABS Take 1 tablet by mouth daily.     RABEprazole (ACIPHEX) 20 MG tablet Take 1 tablet (20 mg total) by mouth in the morning and at bedtime. PLEASE CALL [phone] TO SCHEDULE AN APPOINTMENT FOR FURTHER REFILLS. 60 tablet 0   tamoxifen (NOLVADEX) 20 MG tablet Take 1 tablet by mouth daily.     traZODone (DESYREL) 100 MG tablet TAKE 1 & 1/2 TABLETS ('150MG'$  TOTAL) BY MOUTH AT BEDTIME 135 tablet 0   venlafaxine (EFFEXOR) 75 MG tablet TAKE 1 TABLET (75 MG TOTAL) BY MOUTH DAILY. NEEDS APPT 90 tablet 1   Prucalopride Succinate (MOTEGRITY) 2 MG TABS Take 1 tablet (2 mg total) by mouth daily. (Patient not taking: Reported on 08/24/2022) 30 tablet 11   No current facility-administered medications for this visit.    Physical Exam:     BP 118/70   Pulse (!) 102   Ht 5' 3.75" (1.619 m)   Wt 151 lb (68.5 kg)  BMI 26.12 kg/m   GENERAL:  Pleasant female in NAD PSYCH: : Cooperative, normal affect ABDOMEN: Mild generalized abdominal discomfort without rebound or guarding.  Nondistended, soft, normal bowel sounds SKIN:  turgor, no lesions seen Musculoskeletal:  Normal muscle tone, normal strength NEURO: Alert and oriented x 3, no focal neurologic deficits   IMPRESSION and PLAN:    1) IBS-C Longstanding history of IBS-C and has trialed multiple medications as outlined above.  Some of her recent exacerbation is also in the setting of bronchitis and cough medicine.  She has now completed those medications and will evaluate for medication efficacy.  - Resume Amitiza 24 mcg bid.  She will call to update me next week to evaluate medication efficacy - Continue adequate hydration with at least 64 ounces of water/day as currently doing - Continue healthy eating habits - After 1 week, if needed can reintroduce MiraLAX prn  2) Colon cancer screening Due for age-appropriate, average risk screening.  Discussed optical colonoscopy vs Cologuard  today - Schedule colonoscopy - Plan for extended 2-day bowel preparation.  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  Will continue - Continue lifestyle/dietary modifications with avoidance of exacerbating type foods  The indications, risks, and benefits of colonoscopy were explained to the patient in detail. Risks include but are not limited to bleeding, perforation, adverse reaction to medications, and cardiopulmonary compromise. Sequelae include but are not limited to the possibility of surgery, hospitalization, and mortality. The patient verbalized understanding and wished to proceed. All questions answered, referred to the scheduler and bowel prep ordered. Further recommendations pending results of the exam.        Lavena Bullion ,DO, FACG 08/24/2022, 9:58 AM

## 2022-08-24 NOTE — Telephone Encounter (Signed)
I called patient back to go over prep instruction. Patient left before I could go over instruction at the office visit today. Instructions have been made available on mychart and sent via mail as well. Advised her to call us with questions. Patient verbalized understanding.

## 2022-08-24 NOTE — Patient Instructions (Addendum)
You have been scheduled for a colonoscopy. Please follow written instructions given to you at your visit today.  Please pick up your prep supplies at the pharmacy within the next 1-3 days. If you use inhalers (even only as needed), please bring them with you on the day of your procedure.   _________________________________________________  If your blood pressure at your visit was 140/90 or greater, please contact your primary care physician to follow up on this.  _______________________________________________________  If you are age 50 or younger, your body mass index should be between 19-25. Your Body mass index is 26.12 kg/m. If this is out of the aformentioned range listed, please consider follow up with your Primary Care Provider.   __________________________________________________________  The [facility] providers would like to encourage you to use [facility] to communicate with providers for non-urgent requests or questions.  Due to long hold times on the telephone, sending your provider a message by [facility] may be a faster and more efficient way to get a response.  Please allow 48 business hours for a response.  Please remember that this is for non-urgent requests.   Due to recent changes in healthcare laws, you may see the results of your imaging and laboratory studies on MyChart before your provider has had a chance to review them.  We understand that in some cases there may be results that are confusing or concerning to you. Not all laboratory results come back in the same time frame and the provider may be waiting for multiple results in order to interpret others.  Please give [place] 48 hours in order for your provider to thoroughly review all the results before contacting the office for clarification of your results.     Thank you for choosing me and [facility].  Vito Cirigliano, D.O.

## 2022-09-01 NOTE — Telephone Encounter (Signed)
Patient Advocate Encounter  Prior Authorization for Motegrity '2mg'$  has been approved.     Effective: 08-18-2022 to 08-19-2023

## 2022-09-05 ENCOUNTER — Other Ambulatory Visit: Payer: Self-pay | Admitting: Gastroenterology

## 2022-09-09 ENCOUNTER — Ambulatory Visit: Payer: BC Managed Care – PPO | Admitting: Physician Assistant

## 2022-09-09 ENCOUNTER — Telehealth: Payer: Self-pay | Admitting: Gastroenterology

## 2022-09-09 ENCOUNTER — Encounter: Payer: Self-pay | Admitting: Physician Assistant

## 2022-09-09 ENCOUNTER — Ambulatory Visit (INDEPENDENT_AMBULATORY_CARE_PROVIDER_SITE_OTHER): Payer: BC Managed Care – PPO

## 2022-09-09 VITALS — BP 138/78 | HR 92 | Ht 63.0 in | Wt 155.1 lb

## 2022-09-09 DIAGNOSIS — R0781 Pleurodynia: Secondary | ICD-10-CM

## 2022-09-09 DIAGNOSIS — R052 Subacute cough: Secondary | ICD-10-CM

## 2022-09-09 DIAGNOSIS — K581 Irritable bowel syndrome with constipation: Secondary | ICD-10-CM

## 2022-09-09 DIAGNOSIS — J4541 Moderate persistent asthma with (acute) exacerbation: Secondary | ICD-10-CM

## 2022-09-09 MED ORDER — TRELEGY ELLIPTA 100-62.5-25 MCG/ACT IN AEPB: 1.0000 | INHALATION_SPRAY | Freq: Every day | RESPIRATORY_TRACT | 2 refills | Status: DC

## 2022-09-09 NOTE — Telephone Encounter (Signed)
Called patient back to inform her Dr. Bryan Lemma is off today and we will get back to her his suggestions and recommendations. Patient verbalized understanding.  Dr. Bryan Lemma, Please look at the telephone note below and advise. Thank you!

## 2022-09-09 NOTE — Telephone Encounter (Signed)
Inbound call from patient states she went to pick up [place] at her pharmacy and even with the prior authorization the medication was costly. Patient is requesting an alternative. Patient also has questions about the side effects of the medication. Please advise.

## 2022-09-09 NOTE — Progress Notes (Signed)
Non-displaced fracture of the eighth rib which could contribute to your pain and can take 4-6 weeks to heal.

## 2022-09-09 NOTE — Progress Notes (Unsigned)
Acute Office Visit  Subjective:     Patient ID: Tara Howard, female    DOB: [birth year], 52 y.o.   MRN: [id number]  Chief Complaint  Patient presents with   pain under breast    HPI Patient is in today for left sided chest and flank pain. She is has been battling bronchitis and cough since early January. She has known asthma. She is on BREO but feels like that has never helped like Qvar used to. She has completed 2 antibiotics and 2 rounds of prednisone. She is better but still has dry cough and now for 4 days her left lower ribs and flank and under breast hurt. No trauma except coughing. She does have History of left breast cancer hx of mascectomy. Taking tylenol for pain now. She is concerned. No fever, chills, body aches.  .. 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   Left ankle pain 08/29/2017   Genetic testing 09/28/2018   Malignant neoplasm of left female breast ([place]) 09/06/2018   Anxiety and depression 10/24/2018   Primary osteoarthritis of left hip 01/31/2019   Hot flash due to medication 02/19/2019   Encounter for monitoring tamoxifen therapy 09/28/2018   Increased risk of breast cancer 11/08/2018   Foot trauma, right, initial encounter 01/29/2020   Primary osteoarthritis of first carpometacarpal joint of left hand 09/08/2020   Acute non-recurrent pansinusitis 05/24/2021   Mild persistent asthma without complication 99991111   Canker sores oral 12/01/2021   Screening examination for STD (sexually transmitted disease) 12/01/2021   Trochanteric bursitis, left hip 01/11/2022   COVID-19 06/01/2022   Subacute cough 09/12/2022   Rib  pain on left side 09/12/2022   Resolved Ambulatory Problems    Diagnosis Date Noted   Acute maxillary sinusitis 05/28/2010   Acute bronchitis 05/21/2010   DYSPEPSIA 01/20/2010   Gastritis without bleeding 10/20/2010   Needs flu shot 03/25/2016   Acute recurrent maxillary sinusitis 08/29/2017   Sinobronchitis 12/13/2021   Past Medical History:  Diagnosis Date   Allergy    Anxiety    Asthma    Breast cancer ([place])    Chronic headache    Depression    GERD (gastroesophageal reflux disease)    IBS (irritable bowel syndrome)    Osteoarthritis      ROS See HPI.      Objective:    BP 138/78 (BP Location: Left Arm, Patient Position: Sitting, Cuff Size: Small)   Pulse 92   Ht 5' 3"$  (1.6 m)   Wt 155 lb 1.9 oz (70.4 kg)   SpO2 97%   BMI 27.48 kg/m  BP Readings from Last 3 Encounters:  09/09/22 138/78  08/24/22 118/70  08/11/22 (!) 143/94   Wt Readings from Last 3 Encounters:  09/09/22 155 lb 1.9 oz (70.4 kg)  08/24/22 151 lb (68.5 kg)  08/11/22 155 lb (70.3 kg)      Physical Exam Vitals reviewed.  Constitutional:      Appearance: Normal appearance.  HENT:     Head: Normocephalic.     Right Ear: Tympanic membrane, ear canal and external ear normal. There is no impacted cerumen.     Left  Ear: Tympanic membrane, ear canal and external ear normal. There is no impacted cerumen.     Nose: Nose normal.     Mouth/Throat:     Mouth: Mucous membranes are moist.  Eyes:     Extraocular Movements: Extraocular movements intact.     Conjunctiva/sclera: Conjunctivae normal.     Pupils: Pupils are equal, round, and reactive to light.  Cardiovascular:     Rate and Rhythm: Normal rate and regular rhythm.     Pulses: Normal pulses.     Heart sounds: Normal heart sounds.  Pulmonary:     Effort: Pulmonary effort is normal.     Breath sounds: Normal breath sounds.     Comments: Left lower ribs tender to palpation.  No breast tenderness Musculoskeletal:     Right lower leg: No  edema.     Left lower leg: No edema.  Neurological:     General: No focal deficit present.     Mental Status: She is alert and oriented to person, place, and time.  Psychiatric:        Mood and Affect: Mood normal.          Assessment & Plan:  .Marland KitchenJulaine was seen today for pain under breast.  Diagnoses and all orders for this visit:  Subacute cough -     DG Ribs Unilateral W/Chest Left; Future  Rib pain on left side -     DG Ribs Unilateral W/Chest Left; Future  Moderate persistent asthma with acute exacerbation -     Fluticasone-Umeclidin-Vilant (TRELEGY ELLIPTA) 100-62.5-25 MCG/ACT AEPB; Inhale 1 puff into the lungs daily.   Vitals look good today Post viral cough continues but improving No signs of bacterial infection Reviewed xray showed non-displaced fracture of 8th rib  Pain would be expected for the next few weeks ok to alternate tylenol and ibuprofen Ice regularly Continue to take good deep breaths If you feel like you need something a little stronger for pain let me know Stop BREO trial of trelegy to see if controls SOB/cough better   Iran Planas, PA-C

## 2022-09-09 NOTE — Patient Instructions (Signed)
Costochondritis  Costochondritis is inflammation of the tissue (cartilage) that connects the ribs to the breastbone (sternum). This causes pain in the front of the chest. The pain usually starts slowly and involves more than one rib. What are the causes? The exact cause of this condition is not always known. It results from stress on the cartilage where your ribs attach to your sternum. The cause of this stress could be: Chest injury. Exercise or activity, such as lifting. Severe coughing. What increases the risk? You are more likely to develop this condition if you: Are female. Are 67-69 years old. Recently started a new exercise or work activity. Have low levels of vitamin D. Have a condition that makes you cough frequently. What are the signs or symptoms? The main symptom of this condition is chest pain. The pain: Usually starts gradually and can be sharp or dull. Gets worse with deep breathing, coughing, or exercise. Gets better with rest. May be worse when you press on the affected area of your ribs and sternum. How is this diagnosed? This condition is diagnosed based on your symptoms, your medical history, and a physical exam. Your health care provider will check for pain when pressing on your sternum. You may also have tests to rule out other causes of chest pain. These may include: A chest X-ray to check for lung problems. An ECG (electrocardiogram) to see if you have a heart problem that could be causing the pain. An imaging scan to rule out a chest or rib fracture. How is this treated? This condition usually goes away on its own over time. Your health care provider may prescribe an NSAID, such as ibuprofen, to reduce pain and inflammation. Treatment may also include: Resting and avoiding activities that make pain worse. Applying heat or ice to the area to reduce pain and inflammation. Doing exercises to stretch your chest muscles. If these treatments do not help, your health  care provider may inject a numbing medicine at the sternum-rib connection to help relieve the pain. Follow these instructions at home: Managing pain, stiffness, and swelling     If directed, put ice on the painful area. To do this: Put ice in a plastic bag. Place a towel between your skin and the bag. Leave the ice on for 20 minutes, 2-3 times a day. If directed, apply heat to the affected area as often as told by your health care provider. Use the heat source that your health care provider recommends, such as a moist heat pack or a heating pad. Place a towel between your skin and the heat source. Leave the heat on for 20-30 minutes. Remove the heat if your skin turns bright red. This is especially important if you are unable to feel pain, heat, or cold. You may have a greater risk of getting burned. Activity Rest as told by your health care provider. Avoid activities that make pain worse. This includes any activities that use chest, abdominal, and side muscles. Do not lift anything that is heavier than 10 lb (4.5 kg), or the limit that you are told, until your health care provider says that it is safe. Return to your normal activities as told by your health care provider. Ask your health care provider what activities are safe for you. General instructions Take over-the-counter and prescription medicines only as told by your health care provider. Keep all follow-up visits as told by your health care provider. This is important. Contact a health care provider if: You have chills  or a fever. Your pain does not go away or it gets worse. You have a cough that does not go away. Get help right away if: You have shortness of breath. You have severe chest pain that is not relieved by medicines, heat, or ice. These symptoms may represent a serious problem that is an emergency. Do not wait to see if the symptoms will go away. Get medical help right away. Call your local emergency services (911 in  the [place] Do not drive yourself to the hospital.  Summary Costochondritis is inflammation of the tissue (cartilage) that connects the ribs to the breastbone (sternum). This condition causes pain in the front of the chest. Costochondritis results from stress on the cartilage where your ribs attach to your sternum. Treatment may include medicines, rest, heat or ice, and exercises. This information is not intended to replace advice given to you by your health care provider. Make sure you discuss any questions you have with your health care provider. Document Revised: 09/28/2021 Document Reviewed: 05/24/2019 Elsevier Patient Education  [place].

## 2022-09-12 ENCOUNTER — Encounter: Payer: Self-pay | Admitting: Physician Assistant

## 2022-09-12 DIAGNOSIS — R052 Subacute cough: Secondary | ICD-10-CM | POA: Insufficient documentation

## 2022-09-12 DIAGNOSIS — R0781 Pleurodynia: Secondary | ICD-10-CM | POA: Insufficient documentation

## 2022-09-12 NOTE — Telephone Encounter (Signed)
I spoke with the patient. Stated she tried Amitiza 24MG BID even with Miralax but was not effective. Stated she also tried Linzess in the past about 10 to 15 years ago. She stopped taking it due to severe diarrhea and abdominal cramping. She is requesting to see if there are different doses of Amitiza she can take. Please advise.

## 2022-09-12 NOTE — Telephone Encounter (Signed)
Her last OV was 08/24/2022 and her plan was to resume the Amitiza 24 mcg twice daily update me on medication efficacy. Has that not been working?  The Motegrity was prescribed sometime ago, but she wanted to continue the Amitiza for the time being.  If the Amitiza is otherwise working, I would continue with that medication and hold off on the [place] given the high cost.  Otherwise, if Amitiza not as efficacious, can either add back MiraLAX on demand, or we can consider going back to [place] which she had only used for a brief trial in the past.  Otherwise, there's not a clear cost favorable substitute for the Motegrity from a mechanism of action standpoint.

## 2022-09-13 ENCOUNTER — Ambulatory Visit: Payer: BC Managed Care – PPO | Admitting: Sports Medicine

## 2022-09-14 MED ORDER — LINACLOTIDE 72 MCG PO CAPS: 72.0000 ug | ORAL_CAPSULE | Freq: Every day | ORAL | 0 refills | Status: DC

## 2022-09-14 NOTE — Telephone Encounter (Signed)
Can we obtain PA for Linzess 72 MCG for this patient? She didn't try Motegrity due to cost. Thank you.

## 2022-09-14 NOTE — Telephone Encounter (Signed)
Called patient and provided instruction for MiraLAX bowel purge. Also sent instruction via [place]. Patient verbalized understanding.

## 2022-09-14 NOTE — Telephone Encounter (Signed)
I spoke with Tara Howard on the phone today.  Still with constipation and hard stools despite higher dose Amitiza.  Added 1/2 cap of MiraLAX twice daily to her regimen with only mild improvement.  We reviewed medication options.  Has previously trialed Trulance and Linzess.  Motegrity is cost prohibitive.  We discussed her previous trial of Linzess which was 10+ years ago and stopped due to diarrhea and severe abdominal cramping.  It is possible that she was started up on high-dose this, and we may do better with lower dose and titrating up as needed.  Additionally, we discussed purge prep then initiation of Linzess, and wrap of the following plan together:  - MiraLAX purge prep.  Please call patient and instruct her on how to take this - Start Linzess 72 mcg daily.  Start taking this 2 days after completion of purge prep.  She knows to hold off on any furtherlaxatives, stool softeners, etc. within the first 7-10 days of starting Linzess.  If suboptimal response, will uptitrate the dose - Again counseled on medication ADR profile of Linzess, to include expected diarrhea and abdominal cramping in the for 7-10 days - I have asked her to give a call or MyChart message 2-3 weeks after starting to let me know how she is doing - All questions answered and appreciative of the phone call

## 2022-09-19 ENCOUNTER — Encounter: Payer: Self-pay | Admitting: Family Medicine

## 2022-09-19 ENCOUNTER — Ambulatory Visit: Payer: BC Managed Care – PPO | Admitting: Family Medicine

## 2022-09-19 VITALS — BP 135/85 | HR 91 | Ht 63.0 in | Wt 155.0 lb

## 2022-09-19 DIAGNOSIS — J0140 Acute pansinusitis, unspecified: Secondary | ICD-10-CM | POA: Diagnosis not present

## 2022-09-19 DIAGNOSIS — R0781 Pleurodynia: Secondary | ICD-10-CM

## 2022-09-19 MED ORDER — DOXYCYCLINE HYCLATE 100 MG PO TABS: 100.0000 mg | ORAL_TABLET | Freq: Two times a day (BID) | ORAL | 0 refills | Status: AC

## 2022-09-19 MED ORDER — TRAMADOL HCL 50 MG PO TABS: 50.0000 mg | ORAL_TABLET | Freq: Three times a day (TID) | ORAL | 0 refills | Status: AC | PRN

## 2022-09-19 NOTE — Assessment & Plan Note (Signed)
A course of doxycycline.  I think we can hold off on steroids for now.  She can continue Flonase and sinus rinses.  Instructed to contact clinic if symptoms are not improving.

## 2022-09-19 NOTE — Patient Instructions (Signed)

## 2022-09-19 NOTE — Progress Notes (Signed)
Tara Howard - 52 y.o. female MRN [id number]  Date of birth: 1971/04/14  Subjective Chief Complaint  Patient presents with   Sinusitis    HPI Tara Howard is a 52 year old female here today with complaint of sinus pain.  She reports that symptoms started about 2 weeks ago.  She has pain and pressure in the maxillary and frontal sinus.  Associated upper teeth and gum pain, headaches.  Mucus is very thick and yellow/green in coloration.  She has had no fever with this.  She did complete couple courses of antibiotics a little over a month ago for lower respiratory infection/bronchitis.  Cough is improved with change to Trelegy.  ROS:  A comprehensive ROS was completed and negative except as noted per HPI  Allergies  Allergen Reactions   Azithromycin Diarrhea, Nausea And Vomiting and Other (See Comments)   Hycodan [Hydrocodone Bit-Homatrop Mbr] Other (See Comments)    Nausea   Other Other (See Comments)   Augmentin [Amoxicillin-Pot Clavulanate] Diarrhea, Nausea And Vomiting and Other (See Comments)    Stomach cramps Did it involve swelling of the face/tongue/throat, SOB, or low BP? No Did it involve sudden or severe rash/hives, skin peeling, or any reaction on the inside of your mouth or nose? No Did you need to seek medical attention at a hospital or doctor's office? No When did it last happen? More than 3 years ago    If all above answers are "NO", may proceed with cephalosporin use.    Clonidine Derivatives Rash   Flexeril [Cyclobenzaprine] Other (See Comments)    constipation   Linzess [Linaclotide] Other (See Comments)    cramping   Oxybutynin Rash   Zolpidem Tartrate Other (See Comments)    Hallucinations    Past Medical History:  Diagnosis Date   Allergy    allergist in [place]   Anxiety    Asthma    Breast cancer ([place])    Chronic headache    Depression    Fibromyalgia    Fibromyalgia 2006   pain / headaches   GERD (gastroesophageal reflux disease)    IBS  (irritable bowel syndrome)    Osteoarthritis    in her hips   Vaginismus     Past Surgical History:  Procedure Laterality Date   bilateral mastectomy  11/01/2018   BRAVO [place] STUDY N/A 03/26/2019   Procedure: BRAVO [place];  Surgeon: Lavena Bullion, DO;  Location: [facility];  Service: Gastroenterology;  Laterality: N/A;   BRAVO [place] STUDY N/A 06/27/2019   Procedure: BRAVO [place];  Surgeon: Lavena Bullion, DO;  Location: [facility];  Service: Gastroenterology;  Laterality: N/A;   BREAST LUMPECTOMY Left 09/2018   BREAST RECONSTRUCTION  12/2018   CESAREAN SECTION     X 2   ENDOMETRIAL ABLATION     ESOPHAGOGASTRODUODENOSCOPY     said it was done maybe about 10 years ago. In [place]   ESOPHAGOGASTRODUODENOSCOPY (EGD) WITH PROPOFOL N/A 03/26/2019   Procedure: ESOPHAGOGASTRODUODENOSCOPY (EGD) WITH PROPOFOL;  Surgeon: Lavena Bullion, DO;  Location: [facility];  Service: Gastroenterology;  Laterality: N/A;   ESOPHAGOGASTRODUODENOSCOPY (EGD) WITH PROPOFOL N/A 06/27/2019   Procedure: ESOPHAGOGASTRODUODENOSCOPY (EGD) WITH PROPOFOL;  Surgeon: Lavena Bullion, DO;  Location: [facility];  Service: Gastroenterology;  Laterality: N/A;   TONSILLECTOMY     TONSILLECTOMY     TUBAL LIGATION     UPPER GASTROINTESTINAL ENDOSCOPY      Social History   Socioeconomic History   Marital status: Divorced  Spouse name: Not on file   Number of children: 2   Years of education: Not on file   Highest education level: Not on file  Occupational History   Occupation: Health and safety inspector    Employer: [employer]    Comment: [employer]  Tobacco Use   Smoking status: Never   Smokeless tobacco: Never  Vaping Use   Vaping Use: Never used  Substance and Sexual Activity   Alcohol use: Not Currently   Drug use: Never   Sexual activity: Not Currently    Birth control/protection: Surgical  Other Topics Concern   Not on file  Social History Narrative   Not on file    Social Determinants of Health   Financial Resource Strain: Not on file  Food Insecurity: Not on file  Transportation Needs: Not on file  Physical Activity: Not on file  Stress: Not on file  Social Connections: Not on file    Family History  Problem Relation Age of Onset   Depression Mother    Hypertension Mother    Heart attack Mother    CAD Mother        3 vessel bypass   Irritable bowel syndrome Mother    Aneurysm Mother        brain   CAD Father        3 vessel bypass   Cancer Paternal Grandmother        of the bowel per patient    Bone cancer Paternal Grandmother    Colon cancer Neg Hx    Pancreatic cancer Neg Hx    Liver disease Neg Hx    Stomach cancer Neg Hx    Esophageal cancer Neg Hx     Health Maintenance  Topic Date Due   COVID-19 Vaccine (3 - Pfizer risk series) 06/12/2023 (Originally 11/19/2019)   COLONOSCOPY (Pts 45-62yr Insurance coverage will need to be confirmed)  07/27/2023 (Originally 09/19/2022)   MAMMOGRAM  09/10/2023 (Originally 08/17/2019)   PAP SMEAR-Modifier  06/23/2027   INFLUENZA VACCINE  Completed   Hepatitis C Screening  Completed   HIV Screening  Completed   Pneumococcal Vaccine 129612Years old  Aged Out   HPV VACCINES  Aged Out   DTaP/Tdap/Td  Discontinued     ----------------------------------------------------------------------------------------------------------------------------------------------------------------------------------------------------------------- Physical Exam BP 135/85 (BP Location: Right Arm, Patient Position: Sitting, Cuff Size: Normal)   Pulse 91   Ht '5\' 3"'$  (1.6 m)   Wt 155 lb (70.3 kg)   SpO2 100%   BMI 27.46 kg/m   Physical Exam Constitutional:      Appearance: Normal appearance.  HENT:     Head: Normocephalic and atraumatic.     Mouth/Throat:     Comments: Maxillary and frontal sinus tenderness. Eyes:     General: No scleral icterus. Cardiovascular:     Rate and Rhythm: Normal rate and  regular rhythm.  Pulmonary:     Effort: Pulmonary effort is normal.     Breath sounds: Normal breath sounds.  Musculoskeletal:     Cervical back: Neck supple.  Neurological:     Mental Status: She is alert.  Psychiatric:        Mood and Affect: Mood normal.        Behavior: Behavior normal.     ------------------------------------------------------------------------------------------------------------------------------------------------------------------------------------------------------------------- Assessment and Plan  Acute non-recurrent pansinusitis A course of doxycycline.  I think we can hold off on steroids for now.  She can continue Flonase and sinus rinses.  Instructed to contact clinic if symptoms are  not improving.  Rib pain on left side Nondisplaced rib fracture noted on recent chest x-ray secondary to cough.  I did provide a prescription for a few additional tramadol to help with pain control.   Meds ordered this encounter  Medications   doxycycline (VIBRA-TABS) 100 MG tablet    Sig: Take 1 tablet (100 mg total) by mouth 2 (two) times daily for 10 days.    Dispense:  20 tablet    Refill:  0   traMADol (ULTRAM) 50 MG tablet    Sig: Take 1 tablet (50 mg total) by mouth every 8 (eight) hours as needed for up to 5 days.    Dispense:  15 tablet    Refill:  0    No follow-ups on file.    This visit occurred during the SARS-CoV-2 public health emergency.  Safety protocols were in place, including screening questions prior to the visit, additional usage of staff PPE, and extensive cleaning of exam room while observing appropriate contact time as indicated for disinfecting solutions.

## 2022-09-19 NOTE — Assessment & Plan Note (Signed)
Nondisplaced rib fracture noted on recent chest x-ray secondary to cough.  I did provide a prescription for a few additional tramadol to help with pain control.

## 2022-09-19 NOTE — Telephone Encounter (Signed)
Hi Monchell,  Following up on Linzess PA for this patient. Thank you!

## 2022-09-26 NOTE — Telephone Encounter (Signed)
Any update on the PA for Linzess ?

## 2022-09-28 ENCOUNTER — Other Ambulatory Visit: Payer: Self-pay | Admitting: Family Medicine

## 2022-09-29 ENCOUNTER — Other Ambulatory Visit: Payer: Self-pay

## 2022-09-29 MED ORDER — NORTRIPTYLINE HCL 50 MG PO CAPS: 50.0000 mg | ORAL_CAPSULE | Freq: Every day | ORAL | 3 refills | Status: DC

## 2022-09-30 ENCOUNTER — Other Ambulatory Visit (HOSPITAL_COMMUNITY): Payer: Self-pay

## 2022-09-30 ENCOUNTER — Telehealth: Payer: Self-pay | Admitting: Pharmacy Technician

## 2022-09-30 NOTE — Telephone Encounter (Signed)
PA not needed. Pt filled on 2.23.24.

## 2022-09-30 NOTE — Telephone Encounter (Signed)
Patient Advocate Encounter  Received notification from [facility] that prior authorization for [facility] is required.   PA NOT NEEDED Key [id number] Status is pending  Last filled at pharmacy level on 2.23.24 for #90 day supply

## 2022-10-04 ENCOUNTER — Other Ambulatory Visit: Payer: Self-pay

## 2022-10-04 MED ORDER — NORTRIPTYLINE HCL 50 MG PO CAPS: 50.0000 mg | ORAL_CAPSULE | Freq: Every day | ORAL | 3 refills | Status: DC

## 2022-10-04 NOTE — Telephone Encounter (Signed)
Patient is calling to follow up un this request .Please advise

## 2022-10-04 NOTE — Telephone Encounter (Signed)
Patient called for a refill of nortriptyline. It looked like medication had been discontinued by another practice. Refill resent to patients pharmacy.

## 2022-10-10 ENCOUNTER — Telehealth: Payer: Self-pay | Admitting: *Deleted

## 2022-10-10 ENCOUNTER — Telehealth: Payer: Self-pay | Admitting: Family Medicine

## 2022-10-10 ENCOUNTER — Other Ambulatory Visit: Payer: Self-pay | Admitting: Family Medicine

## 2022-10-10 MED ORDER — QVAR REDIHALER 40 MCG/ACT IN AERB: 2.0000 | INHALATION_SPRAY | Freq: Two times a day (BID) | RESPIRATORY_TRACT | 6 refills | Status: DC

## 2022-10-10 NOTE — Telephone Encounter (Signed)
Pt called.  Insurance now covers Qvar. Please call [facility] [address] in [place] [phone] for PA.

## 2022-10-10 NOTE — Telephone Encounter (Signed)
Qvar sent to local pharmacy.

## 2022-10-10 NOTE — Telephone Encounter (Signed)
Med sent to local pharmacy

## 2022-10-11 ENCOUNTER — Ambulatory Visit: Payer: BC Managed Care – PPO | Admitting: Family Medicine

## 2022-10-11 ENCOUNTER — Encounter: Payer: Self-pay | Admitting: Family Medicine

## 2022-10-11 VITALS — BP 128/79 | HR 119 | Temp 99.7°F | Resp 18 | Ht 63.0 in | Wt 154.0 lb

## 2022-10-11 DIAGNOSIS — J309 Allergic rhinitis, unspecified: Secondary | ICD-10-CM

## 2022-10-11 DIAGNOSIS — J4541 Moderate persistent asthma with (acute) exacerbation: Secondary | ICD-10-CM

## 2022-10-11 DIAGNOSIS — R052 Subacute cough: Secondary | ICD-10-CM | POA: Diagnosis not present

## 2022-10-11 LAB — POCT INFLUENZA A/B
Influenza A, POC: NEGATIVE
Influenza B, POC: NEGATIVE

## 2022-10-11 LAB — POC COVID19 BINAXNOW: SARS Coronavirus 2 Ag: NEGATIVE

## 2022-10-11 MED ORDER — PREDNISONE 20 MG PO TABS: ORAL_TABLET | ORAL | 0 refills | Status: DC

## 2022-10-11 NOTE — Telephone Encounter (Signed)
Scription was sent for Qvar and it bounced back saying that they would not cover it and that they would cover Pulmicort.

## 2022-10-11 NOTE — Progress Notes (Signed)
Acute Office Visit  Subjective:     Patient ID: Tara Howard, female    DOB: Jun 30, 1971, 52 y.o.   MRN: [id number]  Chief Complaint  Patient presents with   Cough    Patient states she has been dealing with a cough since December and since she has dealt with bronchitis she also states this morning  her coughing has gotten worse and she is coughing up green mucus, She also complains of headaches and chest hurting from coughing    Cough Associated symptoms include wheezing. Pertinent negatives include no chills, fever, hemoptysis or shortness of breath. Her past medical history is significant for environmental allergies.  Patient is in today for acute visit. Pt is new to me.  Cough Pt reports a cough productive of greenish mucus this morning, prior to now was clear but was mild. She has had SOB and has rattling in her chest. She reports some chest pain and has had normal temp. She has had allergy testing done in the past and reported to be allergic to mold and some trees. She does report she has mold in her basement. She has a hx of asthma. Using Xopenex 2 puffs every 6 hours prn and was started on Trelegy last month. Not helping. She reports chest pain with coughing and headaches.  She has also seen provider for this last month and in January. She was sent for chest xray in January and was negative.   Patient Active Problem List   Diagnosis Date Noted   Acute pansinusitis 09/19/2022   Subacute cough 09/12/2022   Rib pain on left side 09/12/2022   COVID-19 06/01/2022   Trochanteric bursitis, left hip 01/11/2022   Canker sores oral 12/01/2021   Screening examination for STD (sexually transmitted disease) 12/01/2021   Mild persistent asthma without complication [id number]   Acute non-recurrent pansinusitis 05/24/2021   Primary osteoarthritis of first carpometacarpal joint of left hand 09/08/2020   Foot trauma, right, initial encounter 01/29/2020   Hot flash due to medication  02/19/2019   Primary osteoarthritis of left hip 01/31/2019   Increased risk of breast cancer 11/08/2018   Anxiety and depression 10/24/2018   Genetic testing 09/28/2018   Encounter for monitoring tamoxifen therapy 09/28/2018   Malignant neoplasm of left female breast ([place]) 09/06/2018   Left ankle pain 08/29/2017   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  Respiratory:  Positive for cough, sputum production and wheezing. Negative for hemoptysis and shortness of breath.   Endo/Heme/Allergies:  Positive for environmental allergies.  All other systems reviewed and are negative.      Objective:    BP 128/79   Pulse (!) 119   Temp 99.7 F (37.6 C) (Oral)   Resp 18   Ht 5\' 3"  (1.6 m)   Wt 154 lb (69.9 kg)   SpO2 96%   BMI 27.28 kg/m    Physical Exam Vitals and nursing note reviewed.  Constitutional:      Appearance: Normal appearance. She is normal weight.  HENT:     Head: Normocephalic and atraumatic.     Right Ear: External ear normal.  Left Ear: External ear normal.     Nose: Nose normal.     Mouth/Throat:     Mouth: Mucous membranes are moist.  Eyes:     Extraocular Movements: Extraocular movements intact.     Conjunctiva/sclera: Conjunctivae normal.     Pupils: Pupils are equal, round, and reactive to light.  Cardiovascular:     Rate and Rhythm: Regular rhythm. Tachycardia present.     Pulses: Normal pulses.     Heart sounds: Normal heart sounds.  Pulmonary:     Effort: Pulmonary effort is normal.     Breath sounds: Normal breath sounds.  Abdominal:     General: Abdomen is flat. Bowel sounds are normal.   Skin:    Capillary Refill: Capillary refill takes less than 2 seconds.  Neurological:     General: No focal deficit present.     Mental Status: She is alert and oriented to person, place, and time. Mental status is at baseline.  Psychiatric:        Mood and Affect: Mood normal.        Behavior: Behavior normal.        Thought Content: Thought content normal.        Judgment: Judgment normal.   No results found for any visits on 10/11/22.      Assessment & Plan:   Problem List Items Addressed This Visit   None   No orders of the defined types were placed in this encounter.  Subacute cough -     POCT Influenza A/B -     POC COVID-19 BinaxNow  Moderate persistent asthma with acute exacerbation -     predniSONE; Take 2 tabs po daily x 3, then take 1 tab po daily x 3 days, then 1/2 tab po daily x 4  Dispense: 11 tablet; Refill: 0 -     Ambulatory referral to Allergy  Allergic rhinitis, unspecified seasonality, unspecified trigger -     predniSONE; Take 2 tabs po daily x 3, then take 1 tab po daily x 3 days, then 1/2 tab po daily x 4  Dispense: 11 tablet; Refill: 0 -     Ambulatory referral to Allergy   -Covid and Flu negative as suspected. Symptoms persistent and recurrent with hx of asthma and allergies. She does report a previous history of seeing an Allergist with confirmed allergy to 'mold and some trees' per patient. Patient to continue all asthma and allergy medicines. Will add steroid taper and refer to Allergy for further evaluation and treatment options.   Pt is in agreement with assessment and plan. No follow-ups on file.  Leeanne Rio, MD

## 2022-10-12 NOTE — Telephone Encounter (Signed)
Spoke w/pt she stated that the Pulmicort doesn't work for her.   She was advised to do a PA for this. [phone]. Will fwd to [place] for PA

## 2022-10-12 NOTE — Addendum Note (Signed)
Addended by: Teddy Spike on: 10/12/2022 12:45 PM   Modules accepted: Orders

## 2022-10-14 ENCOUNTER — Telehealth: Payer: Self-pay

## 2022-10-14 NOTE — Telephone Encounter (Signed)
Initiated Prior authorization [id number] RediHaler 80MCG/ACT aerosol Via: Covermymeds Case/Key:[id number]  Status: approved  as of 10/14/22 Reason:This request is approved from 10/14/22-10/14/23 Notified Pt via: Mychart

## 2022-10-18 ENCOUNTER — Other Ambulatory Visit: Payer: Self-pay | Admitting: Family Medicine

## 2022-10-18 DIAGNOSIS — F5101 Primary insomnia: Secondary | ICD-10-CM

## 2022-10-19 ENCOUNTER — Encounter: Payer: Self-pay | Admitting: Gastroenterology

## 2022-10-20 ENCOUNTER — Telehealth: Payer: Self-pay | Admitting: Family Medicine

## 2022-10-20 DIAGNOSIS — J4541 Moderate persistent asthma with (acute) exacerbation: Secondary | ICD-10-CM

## 2022-10-20 MED ORDER — PULMICORT FLEXHALER 180 MCG/ACT IN AEPB: 2.0000 | INHALATION_SPRAY | Freq: Two times a day (BID) | RESPIRATORY_TRACT | 5 refills | Status: DC

## 2022-10-20 NOTE — Telephone Encounter (Signed)
Sent over prescription for the Pulmicort inhaler is she wanting the Pulmicort nebs as well just want to clarify it is not albuterol nebulizer that she is needing.  Meds ordered this encounter  Medications   budesonide (PULMICORT FLEXHALER) 180 MCG/ACT inhaler    Sig: Inhale 2 puffs into the lungs in the morning and at bedtime.    Dispense:  1 each    Refill:  5

## 2022-10-20 NOTE — Telephone Encounter (Signed)
Pt called requesting to switch from Qvar RediHaler 80MCG/ACT aerosol to a Pulmicort inhaler due to insurance only covering part of the prescription. Pt also requested nebulizer solution.

## 2022-10-24 MED ORDER — IPRATROPIUM-ALBUTEROL 0.5-2.5 (3) MG/3ML IN SOLN: 3.0000 mL | Freq: Four times a day (QID) | RESPIRATORY_TRACT | 1 refills | Status: DC | PRN

## 2022-10-24 NOTE — Telephone Encounter (Signed)
Patient informed and questions whether this is the lowest strength of the Pulmicort.  Per. Dr. Madilyn Fireman this is not the lowest dose. If she has problems filling this with her insurance  she can contact us and let us know and we can send as lowest dose if needed.

## 2022-10-24 NOTE — Telephone Encounter (Signed)
Meds ordered this encounter  Medications   budesonide (PULMICORT FLEXHALER) 180 MCG/ACT inhaler    Sig: Inhale 2 puffs into the lungs in the morning and at bedtime.    Dispense:  1 each    Refill:  5   ipratropium-albuterol (DUONEB) 0.5-2.5 (3) MG/3ML SOLN    Sig: Take 3 mLs by nebulization every 6 (six) hours as needed.    Dispense:  60 mL    Refill:  1

## 2022-10-27 ENCOUNTER — Telehealth: Payer: Self-pay | Admitting: Gastroenterology

## 2022-10-27 ENCOUNTER — Telehealth: Payer: Self-pay

## 2022-10-27 ENCOUNTER — Encounter: Payer: Self-pay | Admitting: Certified Registered Nurse Anesthetist

## 2022-10-27 NOTE — Telephone Encounter (Signed)
Advised patient to take her scheduled medications as usual and stop PO intake 3 hrs prior to procedure. Patient agreed .

## 2022-10-27 NOTE — Telephone Encounter (Signed)
Inbound call from patient wanting to speak with a nurse in regards her procedure for tomorrow she is not sure if she able to take her medication .Please advise

## 2022-10-28 ENCOUNTER — Encounter: Payer: Self-pay | Admitting: Gastroenterology

## 2022-10-28 ENCOUNTER — Ambulatory Visit (AMBULATORY_SURGERY_CENTER): Payer: BC Managed Care – PPO | Admitting: Gastroenterology

## 2022-10-28 VITALS — BP 117/79 | HR 104 | Temp 99.3°F | Resp 13 | Ht 63.0 in | Wt 151.0 lb

## 2022-10-28 DIAGNOSIS — D123 Benign neoplasm of transverse colon: Secondary | ICD-10-CM | POA: Diagnosis not present

## 2022-10-28 DIAGNOSIS — Z1211 Encounter for screening for malignant neoplasm of colon: Secondary | ICD-10-CM | POA: Diagnosis present

## 2022-10-28 MED ORDER — SODIUM CHLORIDE 0.9 % IV SOLN: 500.0000 mL | Freq: Once | INTRAVENOUS | Status: DC

## 2022-10-28 NOTE — Progress Notes (Unsigned)
Called to room to assist during endoscopic procedure.  Patient ID and intended procedure confirmed with present staff. Received instructions for my participation in the procedure from the performing physician.  

## 2022-10-28 NOTE — Op Note (Signed)
[facility] Patient Name: Tara Howard Procedure Date: 10/28/2022 1:48 PM MRN: [id number] Endoscopist: Doristine LocksVito Xitlally Mooneyham , [place], [phone] Age: [age] Referring MD:  Date of Birth: 06/10/1971 Gender: Female Account #: [id number] Procedure:                Colonoscopy Indications:              Screening for colorectal malignant neoplasm                           Last Colonoscopy was 08/2018 and notable for 2 small                            benign polyps in transverse colon, and suboptimal                            prep.                           Does have a history of chronic constipation Medicines:                Monitored Anesthesia Care Procedure:                Pre-Anesthesia Assessment:                           - Prior to the procedure, a History and Physical                            was performed, and patient medications and                            allergies were reviewed. The patient's tolerance of                            previous anesthesia was also reviewed. The risks                            and benefits of the procedure and the sedation                            options and risks were discussed with the patient.                            All questions were answered, and informed consent                            was obtained. Prior Anticoagulants: The patient has                            taken no anticoagulant or antiplatelet agents. ASA                            Grade Assessment: II - A patient with mild systemic  disease. After reviewing the risks and benefits,                            the patient was deemed in satisfactory condition to                            undergo the procedure.                           After obtaining informed consent, the colonoscope                            was passed under direct vision. Throughout the                            procedure, the patient's blood pressure, pulse, and                             oxygen saturations were monitored continuously. The                            CF [id number] #[id number] was introduced through the anus                            and advanced to the the cecum, identified by                            appendiceal orifice and ileocecal valve. The                            colonoscopy was performed without difficulty. The                            patient tolerated the procedure well. Following                            copious irrigation and lavage, the quality of the                            bowel preparation was adequate. The ileocecal                            valve, appendiceal orifice, and rectum were                            photographed. Scope In: 2:53:51 PM Scope Out: 3:08:08 PM Scope Withdrawal Time: 0 hours 11 minutes 29 seconds  Total Procedure Duration: 0 hours 14 minutes 17 seconds  Findings:                 The perianal and digital rectal examinations were                            normal.  A 4 mm polyp was found in the transverse colon. The                            polyp was sessile. The polyp was removed with a                            cold snare. Resection was complete but tissue not                            retrieved due as it fell into an area of signficant                            retained debris. Estimated blood loss was minimal.                           A small amount of stool was found scattered                            throughout the entire colon. Lavage of the area was                            performed using copious amounts of tap water,                            resulting in clearance with adequate visualization.                           The retroflexed view of the distal rectum and anal                            verge was normal and showed no anal or rectal                            abnormalities. Complications:            No immediate complications. Estimated Blood  Loss:     Estimated blood loss was minimal. Impression:               - One 4 mm polyp in the transverse colon, removed                            with a cold snare. Resected and retrieved.                           - Areas of retained stool scattered in the colon,                            but cleared with lavage.                           - The distal rectum and anal verge are normal on  retroflexion view.                           - The GI Genius (intelligent endoscopy module),                            computer-aided polyp detection system powered by AI                            was utilized to detect colorectal polyps through                            enhanced visualization during colonoscopy. Recommendation:           - Patient has a contact number available for                            emergencies. The signs and symptoms of potential                            delayed complications were discussed with the                            patient. Return to normal activities tomorrow.                            Written discharge instructions were provided to the                            patient.                           - Resume previous diet.                           - Continue present medications.                           - Repeat colonoscopy in 7 years for surveillance.                           - Return to GI office PRN. Doristine Locks, MD 10/28/2022 3:21:32 PM

## 2022-10-28 NOTE — Progress Notes (Unsigned)
GASTROENTEROLOGY PROCEDURE H&P NOTE   Primary Care Physician: Agapito Games, MD    Reason for Procedure:   Constipation, hx of colon polyps, change in stools  Plan:    Colonoscopy  Patient is appropriate for endoscopic procedure(s) in the ambulatory ([facility]) setting.  The nature of the procedure, as well as the risks, benefits, and alternatives were carefully and thoroughly reviewed with the patient. Ample time for discussion and questions allowed. The patient understood, was satisfied, and agreed to proceed.     HPI: Tara Howard is a 52 y.o. female who presents for Colonoscopy for polyp surveillance/colon cancer screening, along with evaluation of constipation.  Completed extended 2-day prep for procedure today.   Past Medical History:  Diagnosis Date   Allergy    allergist in [place]   Anxiety    Asthma    Breast cancer    Chronic headache    Depression    Fibromyalgia    Fibromyalgia 2006   pain / headaches   GERD (gastroesophageal reflux disease)    IBS (irritable bowel syndrome)    Osteoarthritis    in her hips   Vaginismus     Past Surgical History:  Procedure Laterality Date   bilateral mastectomy  11/01/2018   BRAVO PH STUDY N/A 03/26/2019   Procedure: BRAVO PH STUDY;  Surgeon: Shellia Cleverly, DO;  Location: [facility];  Service: Gastroenterology;  Laterality: N/A;   BRAVO PH STUDY N/A 06/27/2019   Procedure: BRAVO PH STUDY;  Surgeon: Shellia Cleverly, DO;  Location: [facility];  Service: Gastroenterology;  Laterality: N/A;   BREAST LUMPECTOMY Left 09/2018   BREAST RECONSTRUCTION  12/2018   CESAREAN SECTION     X 2   ENDOMETRIAL ABLATION     ESOPHAGOGASTRODUODENOSCOPY     said it was done maybe about 10 years ago. In [place]   ESOPHAGOGASTRODUODENOSCOPY (EGD) WITH PROPOFOL N/A 03/26/2019   Procedure: ESOPHAGOGASTRODUODENOSCOPY (EGD) WITH PROPOFOL;  Surgeon: Shellia Cleverly, DO;  Location: [facility];  Service: Gastroenterology;   Laterality: N/A;   ESOPHAGOGASTRODUODENOSCOPY (EGD) WITH PROPOFOL N/A 06/27/2019   Procedure: ESOPHAGOGASTRODUODENOSCOPY (EGD) WITH PROPOFOL;  Surgeon: Shellia Cleverly, DO;  Location: [facility];  Service: Gastroenterology;  Laterality: N/A;   TONSILLECTOMY     TONSILLECTOMY     TUBAL LIGATION     UPPER GASTROINTESTINAL ENDOSCOPY      Prior to Admission medications   Medication Sig Start Date End Date Taking? Authorizing Provider  acetaminophen (TYLENOL) 650 MG CR tablet Take 1-2 tablets (650-1,300 mg total) by mouth in the morning and at bedtime. 09/08/20  Yes Monica Becton, MD  budesonide (PULMICORT FLEXHALER) 180 MCG/ACT inhaler Inhale 2 puffs into the lungs in the morning and at bedtime. 10/20/22  Yes Agapito Games, MD  celecoxib (CELEBREX) 200 MG capsule One to 2 tablets by mouth daily as needed for pain. 01/11/22  Yes Monica Becton, MD  fluticasone (FLONASE) 50 MCG/ACT nasal spray Place 1 spray into both nostrils daily.   Yes [provider]  gabapentin (NEURONTIN) 300 MG capsule Take 300 mg by mouth 3 (three) times daily. 01/04/22  Yes [provider]  ipratropium-albuterol (DUONEB) 0.5-2.5 (3) MG/3ML SOLN Take 3 mLs by nebulization every 6 (six) hours as needed. 10/24/22   Agapito Games, MD  levalbuterol [facility] HFA) 45 MCG/ACT inhaler INHALE 2 PUFFS INTO THE LUNGS EVERY 6 HOURS AS NEEDED FOR WHEEZING OR SHORTNESS OF BREATH 02/15/22  Yes Agapito Games, MD  linaclotide (  LINZESS) 72 MCG capsule Take 1 capsule (72 mcg total) by mouth daily before breakfast. 09/14/22  Yes Michaiah Maiden V, DO  lubiprostone (AMITIZA) 24 MCG capsule Take 24 mcg by mouth 2 (two) times daily with a meal.   Yes [provider]  montelukast (SINGULAIR) 10 MG tablet Take 1 tablet (10 mg total) by mouth at bedtime. 01/07/22  Yes Breeback, Jade L, PA-C  Multiple Vitamins-Minerals (HAIR SKIN AND NAILS FORMULA PO) Take 2 tablets by mouth daily.   Yes  [provider]  nortriptyline ([name]) 50 MG capsule Take 1 capsule (50 mg total) by mouth at bedtime. 10/04/22  Yes Manjinder Breau V, DO  QULIPTA 60 MG TABS Take 1 tablet by mouth daily. 12/09/21  Yes [provider]  RABEprazole (ACIPHEX) 20 MG tablet Take 1 tablet (20 mg total) by mouth 2 (two) times daily in the am and at bedtime.. 09/05/22  Yes Tanielle Emigh V, DO  tamoxifen (NOLVADEX) 20 MG tablet Take 1 tablet by mouth daily. 12/07/20  Yes [provider]  traZODone (DESYREL) 100 MG tablet TAKE 1 & 1/2 TABLETS (  TOTAL) BY MOUTH AT BEDTIME 10/18/22  Yes Agapito Games, MD  venlafaxine (EFFEXOR) 75 MG tablet TAKE 1 TABLET (75 MG TOTAL) BY MOUTH DAILY. NEEDS APPT 09/28/22  Yes Agapito Games, MD  Cholecalciferol (DIALYVITE VITAMIN D 5000) 125 MCG (5000 UT) capsule Take 5,000 Units by mouth daily.    [provider]  Fluticasone-Umeclidin-Vilant (TRELEGY ELLIPTA) 100-62.5-25 MCG/ACT AEPB Inhale 1 puff into the lungs daily. Patient not taking: Reported on 10/28/2022 09/09/22   Jomarie Longs, PA-C  predniSONE (DELTASONE) 20 MG tablet Take 2 tabs po daily x 3, then take 1 tab po daily x 3 days, then 1/2 tab po daily x 4 Patient not taking: Reported on 10/28/2022 10/11/22   Suzan Slick, MD    Current Outpatient Medications  Medication Sig Dispense Refill   acetaminophen (TYLENOL) 650 MG CR tablet Take 1-2 tablets (650-1,300 mg total) by mouth in the morning and at bedtime. 90 tablet 3   budesonide (PULMICORT FLEXHALER) 180 MCG/ACT inhaler Inhale 2 puffs into the lungs in the morning and at bedtime. 1 each 5   celecoxib (CELEBREX) 200 MG capsule One to 2 tablets by mouth daily as needed for pain. 180 capsule 2   fluticasone (FLONASE) 50 MCG/ACT nasal spray Place 1 spray into both nostrils daily.     gabapentin (NEURONTIN) 300 MG capsule Take 300 mg by mouth 3 (three) times daily.     ipratropium-albuterol (DUONEB) 0.5-2.5 (3) MG/3ML SOLN  Take 3 mLs by nebulization every 6 (six) hours as needed. 60 mL 1   levalbuterol (XOPENEX HFA) 45 MCG/ACT inhaler INHALE 2 PUFFS INTO THE LUNGS EVERY 6 HOURS AS NEEDED FOR WHEEZING OR SHORTNESS OF BREATH 15 each 5   linaclotide (LINZESS) 72 MCG capsule Take 1 capsule (72 mcg total) by mouth daily before breakfast. 90 capsule 0   lubiprostone (AMITIZA) 24 MCG capsule Take 24 mcg by mouth 2 (two) times daily with a meal.     montelukast (SINGULAIR) 10 MG tablet Take 1 tablet (10 mg total) by mouth at bedtime. 90 tablet 3   Multiple Vitamins-Minerals (HAIR SKIN AND NAILS FORMULA PO) Take 2 tablets by mouth daily.     nortriptyline ([name]) 50 MG capsule Take 1 capsule (50 mg total) by mouth at bedtime. 90 capsule 3   QULIPTA 60 MG TABS Take 1 tablet by mouth daily.  RABEprazole (ACIPHEX) 20 MG tablet Take 1 tablet (20 mg total) by mouth 2 (two) times daily in the am and at bedtime.. 180 tablet 3   tamoxifen (NOLVADEX) 20 MG tablet Take 1 tablet by mouth daily.     traZODone (DESYREL) 100 MG tablet TAKE 1 & 1/2 TABLETS (150MG  TOTAL) BY MOUTH AT BEDTIME 135 tablet 0   venlafaxine (EFFEXOR) 75 MG tablet TAKE 1 TABLET (75 MG TOTAL) BY MOUTH DAILY. NEEDS APPT 30 tablet 0   Cholecalciferol (DIALYVITE VITAMIN D 5000) 125 MCG (5000 UT) capsule Take 5,000 Units by mouth daily.     Fluticasone-Umeclidin-Vilant (TRELEGY ELLIPTA) 100-62.5-25 MCG/ACT AEPB Inhale 1 puff into the lungs daily. (Patient not taking: Reported on 10/28/2022) 28 each 2   predniSONE (DELTASONE) 20 MG tablet Take 2 tabs po daily x 3, then take 1 tab po daily x 3 days, then 1/2 tab po daily x 4 (Patient not taking: Reported on 10/28/2022) 11 tablet 0   Current Facility-Administered Medications  Medication Dose Route Frequency Provider Last Rate Last Admin   0.9 %  sodium chloride infusion  500 mL Intravenous Once Nahomy Limburg V, DO        Allergies as of 10/28/2022 - Review Complete 10/28/2022  Allergen Reaction Noted   Azithromycin  Diarrhea, Nausea And Vomiting, and Other (See Comments) 02/11/2016   Hycodan [hydrocodone bit-homatrop mbr] Other (See Comments) 07/13/2020   Other Other (See Comments) 06/22/2022   Augmentin [amoxicillin-pot clavulanate] Diarrhea, Nausea And Vomiting, and Other (See Comments) 01/22/2013   Clonidine derivatives Rash 03/20/2019   Flexeril [cyclobenzaprine] Other (See Comments) 11/29/2016   Linzess [linaclotide] Other (See Comments) 11/22/2018   Oxybutynin Rash 03/20/2019   Zolpidem tartrate Other (See Comments)     Family History  Problem Relation Age of Onset   Depression Mother    Hypertension Mother    Heart attack Mother    CAD Mother        3 vessel bypass   Irritable bowel syndrome Mother    Aneurysm Mother        brain   CAD Father        3 vessel bypass   Cancer Paternal Grandmother        of the bowel per patient    Bone cancer Paternal Grandmother    Colon cancer Neg Hx    Pancreatic cancer Neg Hx    Liver disease Neg Hx    Stomach cancer Neg Hx    Esophageal cancer Neg Hx     Social History   Socioeconomic History   Marital status: Divorced    Spouse name: Not on file   Number of children: 2   Years of education: Not on file   Highest education level: Not on file  Occupational History   Occupation: Statistician: [employer]    Comment: [employer]  Tobacco Use   Smoking status: Never   Smokeless tobacco: Never  Vaping Use   Vaping Use: Never used  Substance and Sexual Activity   Alcohol use: Not Currently   Drug use: Never   Sexual activity: Not Currently    Birth control/protection: Surgical  Other Topics Concern   Not on file  Social History Narrative   Not on file   Social Determinants of Health   Financial Resource Strain: Not on file  Food Insecurity: Not on file  Transportation Needs: Not on file  Physical Activity: Not on file  Stress: Not on file  Social Connections: Not on file  Intimate Partner  Violence: Not on file    Physical Exam: Vital signs in last 24 hours: @BP  117/81   Pulse (!) 103   Temp 99.3 F (37.4 C)   Ht 5\' 3"  (1.6 m)   Wt 151 lb (68.5 kg)   SpO2 99%   BMI 26.75 kg/m  GEN: NAD EYE: Sclerae anicteric ENT: MMM CV: Non-tachycardic Pulm: CTA b/l GI: Soft, NT/ND NEURO:  Alert & Oriented x 3   Doristine LocksVito Norinne Jeane, DO [facility]   10/28/2022 1:48 PM

## 2022-10-28 NOTE — Progress Notes (Unsigned)
Report given to PACU, vss 

## 2022-10-28 NOTE — Patient Instructions (Signed)
    Handout on polyps given to you today- no tissue retrieved for pathology     YOU HAD AN ENDOSCOPIC PROCEDURE TODAY AT THE [facility]:   Refer to the procedure report that was given to you for any specific questions about what was found during the examination.  If the procedure report does not answer your questions, please call your gastroenterologist to clarify.  If you requested that your care partner not be given the details of your procedure findings, then the procedure report has been included in a sealed envelope for you to review at your convenience later.  YOU SHOULD EXPECT: Some feelings of bloating in the abdomen. Passage of more gas than usual.  Walking can help get rid of the air that was put into your GI tract during the procedure and reduce the bloating. If you had a lower endoscopy (such as a colonoscopy or flexible sigmoidoscopy) you may notice spotting of blood in your stool or on the toilet paper. If you underwent a bowel prep for your procedure, you may not have a normal bowel movement for a few days.  Please Note:  You might notice some irritation and congestion in your nose or some drainage.  This is from the oxygen used during your procedure.  There is no need for concern and it should clear up in a day or so.  SYMPTOMS TO REPORT IMMEDIATELY:  Following lower endoscopy (colonoscopy or flexible sigmoidoscopy):  Excessive amounts of blood in the stool  Significant tenderness or worsening of abdominal pains  Swelling of the abdomen that is new, acute  Fever of 100F or higher    For urgent or emergent issues, a gastroenterologist can be reached at any hour by calling [phone]. Do not use MyChart messaging for urgent concerns.    DIET:  We do recommend a small meal at first, but then you may proceed to your regular diet.  Drink plenty of fluids but you should avoid alcoholic beverages for 24 hours.  ACTIVITY:  You should plan to take it easy for the  rest of today and you should NOT DRIVE or use heavy machinery until tomorrow (because of the sedation medicines used during the test).    FOLLOW UP: Our staff will call the number listed on your records the next business day following your procedure.  We will call around 7:15- 8:00 am to check on you and address any questions or concerns that you may have regarding the information given to you following your procedure. If we do not reach you, we will leave a message.     If any biopsies were taken you will be contacted by phone or by letter within the next 1-3 weeks.  Please call us at [phone] if you have not heard about the biopsies in 3 weeks.    SIGNATURES/CONFIDENTIALITY: You and/or your care partner have signed paperwork which will be entered into your electronic medical record.  These signatures attest to the fact that that the information above on your After Visit Summary has been reviewed and is understood.  Full responsibility of the confidentiality of this discharge information lies with you and/or your care-partner.

## 2022-10-28 NOTE — Progress Notes (Signed)
Pt. states she completed the prep as prescribed but results brownish and she can not see the bottom of the toilet with passing of stool.  Enema administered by patient in admitting. Enema completed and results appear adequate, cloudy yellow.

## 2022-10-31 ENCOUNTER — Telehealth: Payer: Self-pay

## 2022-10-31 NOTE — Telephone Encounter (Signed)
  Follow up Call-     10/28/2022    1:11 PM  Call back number  Post procedure Call Back phone  # [phone]  Permission to leave phone message Yes     Left message

## 2022-11-02 ENCOUNTER — Telehealth: Payer: Self-pay | Admitting: Gastroenterology

## 2022-11-02 MED ORDER — LINACLOTIDE 145 MCG PO CAPS: 145.0000 ug | ORAL_CAPSULE | Freq: Every day | ORAL | 0 refills | Status: DC

## 2022-11-02 NOTE — Telephone Encounter (Signed)
Called and spoke with patient regarding Dr. Frankey Shown recommendations. Pt will use the supply of Linzess that she has now, she will call us back if Linzess 145 mcg works better for her and we will send in a prescription. Pt has been advised of potential diarrhea and abdominal cramping in the first 1-2 weeks of increasing dosage. Pt understands if this happens to decrease or discontinue Miralax/Dulcolax. Pt verbalized understanding and had no concerns at the end of the call.

## 2022-11-02 NOTE — Telephone Encounter (Signed)
Returned call to patient. Pt reports still not having a bowel movement since her colonoscopy. I informed patient that this is often normal and we typically recommend Miralax to get things moving. Pt states that she has been taking Linzess 72 mcg since Saturday, Miralax BID, and generic dulcolax 2 tablets at night and still has not had a BM. Pt states that she has not passed any stool at all, she is passing a small amount of gas. Pt states that she is always bloated, nothing more than normal. Pt denies any abdominal pain. Pt did not know if you wanted to increase her Linzess. Please advise, thanks.

## 2022-11-02 NOTE — Telephone Encounter (Signed)
Patient is calling requesting a call back from the nurse, she had a colonoscopy done and want to go over some stuff .Please advise

## 2022-11-03 ENCOUNTER — Ambulatory Visit: Payer: BC Managed Care – PPO | Admitting: Family Medicine

## 2022-11-03 ENCOUNTER — Encounter: Payer: Self-pay | Admitting: Family Medicine

## 2022-11-03 VITALS — BP 124/85 | HR 96 | Temp 98.0°F | Ht 63.0 in | Wt 160.1 lb

## 2022-11-03 DIAGNOSIS — J019 Acute sinusitis, unspecified: Secondary | ICD-10-CM | POA: Diagnosis not present

## 2022-11-03 DIAGNOSIS — B9689 Other specified bacterial agents as the cause of diseases classified elsewhere: Secondary | ICD-10-CM | POA: Diagnosis not present

## 2022-11-03 DIAGNOSIS — J040 Acute laryngitis: Secondary | ICD-10-CM

## 2022-11-03 MED ORDER — DOXYCYCLINE HYCLATE 100 MG PO TABS
100.0000 mg | ORAL_TABLET | Freq: Two times a day (BID) | ORAL | 0 refills | Status: AC
Start: 2022-11-03 — End: 2022-11-10

## 2022-11-03 MED ORDER — PREDNISONE 20 MG PO TABS
20.0000 mg | ORAL_TABLET | Freq: Every day | ORAL | 0 refills | Status: AC
Start: 2022-11-03 — End: 2022-11-07

## 2022-11-03 NOTE — Assessment & Plan Note (Signed)
-   physical exam shows sinus tenderness to palpation of maxillary and frontal sinuses bilaterally. Based on duration of symptoms >16month will go ahead and treat with abx.  - based on allergies will give doxycycline. 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 Claims this is her 4th episode this winter/spring

## 2022-11-03 NOTE — Progress Notes (Signed)
Acute Office Visit  Subjective:     Patient ID: Tara Howard, female    DOB: May 19, 1971, 52 y.o.   MRN: [id number]  Chief Complaint  Patient presents with   Nasal Congestion   Allergic Rhinitis     Starting 1 month ago since having Bronchitis. She complains of sinus pain and pressure. She has used nasal rinses, flonase and Zyrtec D. She denies fever, but has some headaches.     HPI Patient is in today for sinus allergies and congestion. She has been using zyrtec d but notes it has been drying her out recently.   Review of Systems  Constitutional:  Negative for chills and fever.  HENT:  Positive for congestion and sinus pain.   Respiratory:  Negative for cough and shortness of breath.   Cardiovascular:  Negative for chest pain.  Neurological:  Negative for headaches.        Objective:    BP 124/85   Pulse 96   Temp 98 F (36.7 C) (Oral)   Ht 5\' 3"  (1.6 m)   Wt 160 lb 1.3 oz (72.6 kg)   SpO2 100%   BMI 28.36 kg/m    Physical Exam Vitals and nursing note reviewed.  Constitutional:      General: She is not in acute distress.    Appearance: Normal appearance.  HENT:     Head: Normocephalic and atraumatic.     Comments: Tenderness to palpation of maxillary sinus bilaterally and frontal sinus bilaterally    Right Ear: External ear normal.     Left Ear: External ear normal.     Ears:     Comments: Fluid on right ear    Nose: Nose normal.  Eyes:     Conjunctiva/sclera: Conjunctivae normal.  Cardiovascular:     Rate and Rhythm: Normal rate and regular rhythm.  Pulmonary:     Effort: Pulmonary effort is normal.     Breath sounds: Normal breath sounds.  Neurological:     General: No focal deficit present.     Mental Status: She is alert and oriented to person, place, and time.  Psychiatric:        Mood and Affect: Mood normal.        Behavior: Behavior normal.        Thought Content: Thought content normal.        Judgment: Judgment normal.    No results  found for any visits on 11/03/22.     Assessment & Plan:   Problem List Items Addressed This Visit       Respiratory   Acute bacterial sinusitis - Primary    - physical exam shows sinus tenderness to palpation of maxillary and frontal sinuses bilaterally. Based on duration of symptoms >74month will go ahead and treat with abx.  - based on allergies will give doxycycline. 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 Claims this is her 4th episode this winter/spring      Relevant Medications   predniSONE (DELTASONE) 20 MG tablet   doxycycline (VIBRA-TABS) 100 MG tablet   Other Relevant Orders   Ambulatory referral to ENT   Laryngitis   Relevant Medications   predniSONE (DELTASONE) 20 MG tablet    Meds ordered this encounter  Medications   predniSONE (DELTASONE) 20 MG tablet    Sig: Take 1 tablet (20 mg total)  by mouth daily with breakfast for 4 days.    Dispense:  4 tablet    Refill:  0   doxycycline (VIBRA-TABS) 100 MG tablet    Sig: Take 1 tablet (100 mg total) by mouth 2 (two) times daily for 7 days.    Dispense:  14 tablet    Refill:  0    Return if symptoms worsen or fail to improve.  Charlton Amor, DO

## 2022-11-04 ENCOUNTER — Other Ambulatory Visit: Payer: Self-pay | Admitting: Family Medicine

## 2022-11-07 ENCOUNTER — Other Ambulatory Visit: Payer: Self-pay | Admitting: Family Medicine

## 2022-11-07 DIAGNOSIS — R42 Dizziness and giddiness: Secondary | ICD-10-CM

## 2022-11-07 MED ORDER — MECLIZINE HCL 25 MG PO TABS
25.0000 mg | ORAL_TABLET | Freq: Three times a day (TID) | ORAL | 0 refills | Status: DC | PRN
Start: 2022-11-07 — End: 2023-03-10

## 2022-11-07 MED ORDER — AZELASTINE HCL 0.1 % NA SOLN: 2.0000 | Freq: Two times a day (BID) | NASAL | 1 refills | Status: DC

## 2022-11-16 ENCOUNTER — Other Ambulatory Visit: Payer: Self-pay

## 2022-11-16 ENCOUNTER — Encounter: Payer: Self-pay | Admitting: Internal Medicine

## 2022-11-16 ENCOUNTER — Ambulatory Visit: Payer: BC Managed Care – PPO | Admitting: Internal Medicine

## 2022-11-16 VITALS — BP 110/72 | HR 103 | Temp 98.3°F | Resp 18 | Ht 64.0 in | Wt 158.1 lb

## 2022-11-16 DIAGNOSIS — J4530 Mild persistent asthma, uncomplicated: Secondary | ICD-10-CM

## 2022-11-16 DIAGNOSIS — J329 Chronic sinusitis, unspecified: Secondary | ICD-10-CM

## 2022-11-16 DIAGNOSIS — J3089 Other allergic rhinitis: Secondary | ICD-10-CM

## 2022-11-16 MED ORDER — AZELASTINE HCL 0.1 % NA SOLN: 2.0000 | Freq: Two times a day (BID) | NASAL | 5 refills | Status: DC

## 2022-11-16 MED ORDER — TRIAMCINOLONE ACETONIDE 55 MCG/ACT NA AERO: 2.0000 | INHALATION_SPRAY | Freq: Every day | NASAL | 5 refills | Status: DC

## 2022-11-16 MED ORDER — LEVALBUTEROL TARTRATE 45 MCG/ACT IN AERO: 1.0000 | INHALATION_SPRAY | Freq: Four times a day (QID) | RESPIRATORY_TRACT | 1 refills | Status: DC | PRN

## 2022-11-16 MED ORDER — PULMICORT FLEXHALER 180 MCG/ACT IN AEPB: 2.0000 | INHALATION_SPRAY | Freq: Two times a day (BID) | RESPIRATORY_TRACT | 5 refills | Status: DC

## 2022-11-16 MED ORDER — LEVALBUTEROL TARTRATE 45 MCG/ACT IN AERO: 2.0000 | INHALATION_SPRAY | RESPIRATORY_TRACT | 3 refills | Status: DC | PRN

## 2022-11-16 NOTE — Progress Notes (Signed)
NEW PATIENT  Date of Service/Encounter:  11/16/22  Consult requested by: Agapito Games, MD   Subjective:   Tara Howard (DOB: [birth year]) is a 52 y.o. female who presents to the clinic on 11/16/2022 with a chief complaint of Establish Care (Sinus issues on inhalers, last skin test 57yrs ago home has mold) and Sinus Problem .    History obtained from: chart review and patient.   Asthma:  Diagnosed around early-mid teens. It has worsened over the last few years.  Reports being sick the previous Fall and aftewards having cough for almost 4 months.  She has tried multiple inhalers like Breo/Trelegy without relief and now is on Pulmicort and is doing better.  Previously was on Qvar and was doing well but had to be switched due to insurance. Still has hoarse voice, does gargle and brush after pulmicort. Due to see ENT next month.   Multiple times a week daytime symptoms in past month, sometimes nighttime awakenings in past month Using rescue inhaler: 2-3x/week  Limitations to daily activity: mild 0 ED visits/UC visits and 6 oral steroids in the past year but these were also for sinus infections 0 number of lifetime hospitalizations, 0 number of lifetime intubations.  Identified Triggers: exercise and respiratory illness and allergies  Prior PFTs or spirometry: done one but nothing recently Previously used therapies: previously did well on Qvar but had to stop due to insurance. Has tried Breo/Trelegy.  Current regimen:  Maintenance: Pulmicort Flexhaler 2 puffs BID; doing well on this Rescue: Albuterol 2 puffs q4-6 hrs PRN  Rhinitis:  Started in childhood. It has worsened the last year.  Symptoms include: nasal congestion, rhinorrhea, post nasal drainage, sneezing, watery eyes, and itchy eyes Multiple courses of antibiotics/prednisone in last year especially in Fall/Winter for sinusitis   Occurs seasonally-Spring Potential triggers: has mold in the house  Treatments  tried:  Zyrtec D daily Alaway eye drops Azelastine PRN Singulair; not sure if it is helping Tried Flonase in the past but it stopped working and had nosebleeds  Previous allergy testing: yes; long time ago. Wishes to hold off on testing at this time due to frequent symptoms and daily anti histamine use.  History of reflux/heartburn: yes on Rabeprazole.  Also on nortriptyline for esophageal sensitivity History of sinus surgery: no Nonallergic triggers: none   Past Medical History: Past Medical History:  Diagnosis Date   Allergy    allergist in [place]   Anxiety    Asthma    Breast cancer    Chronic headache    Depression    Fibromyalgia    Fibromyalgia 2006   pain / headaches   GERD (gastroesophageal reflux disease)    IBS (irritable bowel syndrome)    Osteoarthritis    in her hips   Vaginismus    Past Surgical History: Past Surgical History:  Procedure Laterality Date   bilateral mastectomy  11/01/2018   BRAVO PH STUDY N/A 03/26/2019   Procedure: BRAVO PH STUDY;  Surgeon: Shellia Cleverly, DO;  Location: [facility];  Service: Gastroenterology;  Laterality: N/A;   BRAVO PH STUDY N/A 06/27/2019   Procedure: BRAVO PH STUDY;  Surgeon: Shellia Cleverly, DO;  Location: [facility];  Service: Gastroenterology;  Laterality: N/A;   BREAST LUMPECTOMY Left 09/2018   BREAST RECONSTRUCTION  12/2018   CESAREAN SECTION     X 2   ENDOMETRIAL ABLATION     ESOPHAGOGASTRODUODENOSCOPY     said it was done maybe about 10 years  ago. In [place]   ESOPHAGOGASTRODUODENOSCOPY (EGD) WITH PROPOFOL N/A 03/26/2019   Procedure: ESOPHAGOGASTRODUODENOSCOPY (EGD) WITH PROPOFOL;  Surgeon: Shellia Cleverly, DO;  Location: [facility];  Service: Gastroenterology;  Laterality: N/A;   ESOPHAGOGASTRODUODENOSCOPY (EGD) WITH PROPOFOL N/A 06/27/2019   Procedure: ESOPHAGOGASTRODUODENOSCOPY (EGD) WITH PROPOFOL;  Surgeon: Shellia Cleverly, DO;  Location: [facility];  Service: Gastroenterology;  Laterality:  N/A;   TONSILLECTOMY     TONSILLECTOMY     TUBAL LIGATION     UPPER GASTROINTESTINAL ENDOSCOPY      Family History: Family History  Problem Relation Age of Onset   Depression Mother    Hypertension Mother    Heart attack Mother    CAD Mother        3 vessel bypass   Irritable bowel syndrome Mother    Aneurysm Mother        brain   CAD Father        3 vessel bypass   Cancer Paternal Grandmother        of the bowel per patient    Bone cancer Paternal Grandmother    Colon cancer Neg Hx    Pancreatic cancer Neg Hx    Liver disease Neg Hx    Stomach cancer Neg Hx    Esophageal cancer Neg Hx     Social History:  Lives in a 1954 year house Flooring in bedroom: wood 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 No Did it involve sudden or severe rash/hives, skin peeling, or any reaction on the inside of your mouth or nose? No Did you need to seek medical attention at a hospital or doctor's office? No When did it last happen? More than 3 years ago    If all above answers are "NO", may proceed with cephalosporin use.   Clonidine Derivatives Rash   Flexeril [cyclobenzaprine] Other (See Comments)   constipation   Linzess [linaclotide] Other (See Comments)   cramping   Oxybutynin Rash   Zolpidem Tartrate Other (See Comments)   Hallucinations        Medication List        Accurate as of November 16, 2022  9:55 AM. If you have any questions, ask your nurse or doctor.          acetaminophen 500 MG tablet Commonly known as: TYLENOL Take  500 mg by mouth every 6 (six) hours as needed.   azelastine 0.1 % nasal spray Commonly known as: ASTELIN Place 2 sprays into both nostrils 2 (two) times daily. Use in each nostril as directed   celecoxib 200 MG capsule Commonly known as: CeleBREX One to 2 tablets by mouth daily as needed for pain.   fluticasone 50 MCG/ACT nasal spray Commonly known as: FLONASE Place 1 spray into both nostrils daily.   gabapentin 300 MG capsule Commonly known as: NEURONTIN Take 300 mg by mouth 3 (three) times daily.   HAIR SKIN AND NAILS FORMULA PO Take 2 tablets by mouth daily.   levalbuterol 45 MCG/ACT inhaler Commonly known as: XOPENEX HFA Inhale 2 puffs into  the lungs every 4 (four) hours as needed for wheezing or shortness of breath. What changed: when to take this Changed by: Birder Robson, MD   linaclotide 145 MCG Caps capsule Commonly known as: LINZESS Take 1 capsule (145 mcg total) by mouth daily before breakfast.   meclizine 25 MG tablet Commonly known as: ANTIVERT Take 1 tablet (25 mg total) by mouth 3 (three) times daily as needed for dizziness.   montelukast 10 MG tablet Commonly known as: SINGULAIR Take 1 tablet (10 mg total) by mouth at bedtime.   nortriptyline 50 MG capsule Commonly known as: [name] Take 1 capsule (50 mg total) by mouth at bedtime.   Pulmicort Flexhaler 180 MCG/ACT inhaler Generic drug: budesonide Inhale 2 puffs into the lungs in the morning and at bedtime.   Qulipta 60 MG Tabs Generic drug: Atogepant Take 1 tablet by mouth daily.   RABEprazole 20 MG tablet Commonly known as: ACIPHEX Take 1 tablet (20 mg total) by mouth 2 (two) times daily in the am and at bedtime..   tamoxifen 20 MG tablet Commonly known as: NOLVADEX Take 1 tablet by mouth daily.   traZODone 100 MG tablet Commonly known as: DESYREL TAKE 1 & 1/2 TABLETS (  TOTAL) BY MOUTH AT BEDTIME   venlafaxine 75 MG tablet Commonly known as: EFFEXOR TAKE 1 TABLET (75 MG TOTAL) BY  MOUTH DAILY. NEEDS APPT         REVIEW OF SYSTEMS: Pertinent positives and negatives discussed in HPI.   Objective:   Physical Exam: BP 110/72 (BP Location: Right Arm, Patient Position: Sitting, Cuff Size: Normal)   Pulse (!) 103   Temp 98.3 F (36.8 C) (Temporal)   Resp 18   Ht  (1.626 m)   Wt 158 lb 1.6 oz (71.7 kg)   SpO2 100%   BMI 27.14 kg/m  Body mass index is 27.14 kg/m. GEN: alert, well developed HEENT: clear conjunctiva, TM grey and translucent, nose with + inferior turbinate hypertrophy, pink nasal mucosa, slight clear rhinorrhea, no cobblestoning HEART: regular rate and rhythm, no murmur LUNGS: clear to auscultation bilaterally, no coughing, unlabored respiration ABDOMEN: soft, non distended  SKIN: no rashes or lesions  Reviewed:  11/03/2022: seen by Dr Tamera Punt for sinus tenderness, sinus congestion and was given doxycyline.  Also referred to ENT for recurrent sinusitis.  Given course of prednisone for laryngitis, school teacher.  09/30/2022: seen by heme onc Dr Abbe Amsterdam for follow up; on adjuvant therapy with Tamoxifen for ER+ breast cancer s/p lumpectomy and then bl mastectomy with persistent cough after an illness.  No SOB.  Taking OTC cough suppressant.   07/26/2022: seen by Dr Linford Arnold for respiratory tract infection, given PRN albuterol, doxycycline and prednisone. Normal lung and HEENT exam.  Has a history of asthma.   Spirometry:  Tracings reviewed. Her effort: Good reproducible efforts. FVC: 3.07L FEV1: 2.58L, 93% predicted FEV1/FVC ratio: 84% Interpretation: Spirometry consistent with normal pattern.  Please see scanned spirometry results for details.   Assessment:   1. Mild persistent asthma without complication   2. Other allergic rhinitis   3. Recurrent sinus infections     Plan/Recommendations:  Mild Persistent Asthma: - Normal spirometry today.  - Maintenance inhaler: continue Pulmicort Flexhaler 2 puffs twice daily.  - Rescue  inhaler: Xopenex 1-2 puffs every 4-6 hours as needed for respiratory symptoms of cough, shortness of breath, or wheezing Asthma control goals:  Full participation in all desired activities (may need albuterol before activity) Albuterol use two  times or less a week on average (not counting use with activity) Cough interfering with sleep two times or less a month Oral steroids no more than once a year No hospitalizations   Chronic Rhinitis  - Use nasal saline rinses before nose sprays such as with Neilmed Sinus Rinse.  Use distilled water.   - Use Nasacort 2 sprays each nostril daily. Aim upward and outward. - Use Azelastine 1-2 sprays each nostril twice daily. Aim upward and outward. - Use Zyrtec 10 mg daily or Allegra  daily or Xyzal  daily.   - Stop Singulair  daily due to lack of response.  If symptoms worsen, restart it.  - For eyes, use Olopatadine or Ketotifen 1 eye drop daily as needed for itchy, watery eyes.  Available over the counter, if not covered by insurance.  - Consider retesting in the future. Hold all anti-histamines (Zyrtec/Azelastine/Olopatadine/Alaway-Ketotifen) 3 days prior, if interested.     Recurrent Sinus Infections - Keep track of infections and antibiotics use.   - If frequent, can consider immune evaluation.     Return in about 3 months (around 02/15/2023).  Alesia Morin, MD [facility]

## 2022-11-16 NOTE — Patient Instructions (Addendum)
Tara Howard Return in about 3 months (around 02/15/2023).    Mild Persistent Asthma: - Maintenance inhaler: continue Pulmicort Flexhaler 2 puffs twice daily.  - Rescue inhaler: Xopenex 1-2 puffs every 4-6 hours as needed for respiratory symptoms of cough, shortness of breath, or wheezing Asthma control goals:  Full participation in all desired activities (may need albuterol before activity) Albuterol use two times or less a week on average (not counting use with activity) Cough interfering with sleep two times or less a month Oral steroids no more than once a year No hospitalizations   Chronic Rhinitis  - Use nasal saline rinses before nose sprays such as with Neilmed Sinus Rinse.  Use distilled water.   - Use Nasacort 2  sprays each nostril daily. Aim upward and outward. - Use Azelastine 1-2 sprays each nostril twice daily. Aim upward and outward. - Use Zyrtec 10 mg daily or Allegra  daily or Xyzal  daily.   - Stop Singulair  daily.  If symptoms worsen, restart it.  - For eyes, use Olopatadine or Ketotifen 1 eye drop daily as needed for itchy, watery eyes.  Available over the counter, if not covered by insurance.  - Consider retesting in the future. Hold all anti-histamines (Zyrtec/Azelastine/Olopatadine/Alaway-Ketotifen) 3 days prior, if interested.     Recurrent Sinus Infections - Keep track of infections and antibiotics use.   - If frequent, can consider immune evaluation.

## 2022-11-23 ENCOUNTER — Telehealth: Payer: Self-pay | Admitting: Internal Medicine

## 2022-11-23 NOTE — Telephone Encounter (Signed)
Pt was cough free for a couple of weeks and now she is not getting relief of symptoms with the xopenex is there anything else she can try? She quit taking Singulair at your recommendations would this help if she added it back to regiment? She knows its allergy induced due to her being outside working a lot.

## 2022-11-23 NOTE — Telephone Encounter (Signed)
Tried calling but pts mailbox is full at the currently

## 2022-11-23 NOTE — Telephone Encounter (Signed)
Patient called and stated her rescue inhaler is not working and wanted to know if there is something else can be done

## 2022-11-24 ENCOUNTER — Other Ambulatory Visit: Payer: Self-pay | Admitting: Family Medicine

## 2022-11-24 NOTE — Telephone Encounter (Signed)
Spoke with patient. She states that her cough is not coming from the nasal drainage, but deep within her chest. I did tell her to restart the montelukast. She is coughing up some sputum as far as her throat with no color, no fever. Told her to restart her nasal washes to follow by her nasal sprays which will help with her cough. The cough started Monday and is just getting worse, her cough sounded croupy this morning.

## 2022-11-24 NOTE — Telephone Encounter (Signed)
Lm for pt to call us back  

## 2022-11-24 NOTE — Telephone Encounter (Signed)
Patient called back to return call and stated she will delete her voicemail box just in case she doesn't answer a voicemail can be left.

## 2022-11-29 NOTE — Telephone Encounter (Signed)
Spoke with patient and her cough got worse by Tara Howard coughing up green sputum and blowing out green. Patient saw Dr. [facility] Friday dx with bronchitis,sinus infection given sulfa 800/160 mg DS she is to take one tablet twice a day for 3 wks. Patient's question would she be a good candidate for allergy injections? I didn't see where she has been tested. Please advise.

## 2022-11-29 NOTE — Telephone Encounter (Signed)
Inbound call from patient, states the Linzess twice a day is not working. Stated it was okay to leave detailed message.

## 2022-11-29 NOTE — Telephone Encounter (Signed)
Called and spoke with patient. Pt did not really know what dose of Linzess she was on, then she states that she was taking 2 Linzess 72 mcg tablets daily. Pt has continued to take Miralax BID. No BM in the last 5 days, not even a small amount of stool. Patient reports passing gas today. She reports abdominal bloating, not too much abdominal pain. Pt took 2 Dulcolax the other night and still did not have a BM. Pt states that she eats fresh fruit and bran everyday. Any alternatives?

## 2022-11-29 NOTE — Telephone Encounter (Addendum)
Spoke with patient and she realized she would have to have the allergy test done. She will check with her insurance company and see what her cost will be. She said she's tired being sick every 2 wks.  I told patient when skin testing is scheduled to make sure to stop any antihistamine allegra,benadryl, claritin,zyrtec, clarinex D for 3-5 days prior to testing. Patient confirmed understanding. Nasacort and montelukast are fine to take,

## 2022-11-30 MED ORDER — LINACLOTIDE 290 MCG PO CAPS: 290.0000 ug | ORAL_CAPSULE | Freq: Every day | ORAL | 2 refills | Status: DC

## 2022-11-30 NOTE — Addendum Note (Signed)
Addended by: Missy Sabins on: 11/30/2022 10:04 AM   Modules accepted: Orders

## 2022-11-30 NOTE — Telephone Encounter (Signed)
Called and spoke with patient regarding recommendations. Pt has been advised that we are sending a new prescription for Linzess 290 mcg to her pharmacy. Pt will pick up RX for higher dose and take for a couple of weeks, if no improvement will change to different medication. Pt has been advised to hold Miralax for now until she sees how she responds to increased dose of Linzess. Pt verbalized understanding and had no concerns at the end of the call.

## 2022-12-03 ENCOUNTER — Other Ambulatory Visit: Payer: Self-pay | Admitting: Family Medicine

## 2022-12-07 ENCOUNTER — Telehealth: Payer: Self-pay | Admitting: Internal Medicine

## 2022-12-07 NOTE — Telephone Encounter (Signed)
Patient states she is still coughing even after seeing an ENT. She is requesting a call back to discuss further options

## 2022-12-20 ENCOUNTER — Ambulatory Visit: Payer: BC Managed Care – PPO | Admitting: Family Medicine

## 2022-12-20 ENCOUNTER — Encounter: Payer: Self-pay | Admitting: Family Medicine

## 2022-12-20 VITALS — BP 131/82 | HR 99

## 2022-12-20 DIAGNOSIS — T63441A Toxic effect of venom of bees, accidental (unintentional), initial encounter: Secondary | ICD-10-CM

## 2022-12-20 MED ORDER — PREDNISONE 20 MG PO TABS
40.0000 mg | ORAL_TABLET | Freq: Every day | ORAL | 0 refills | Status: DC
Start: 2022-12-20 — End: 2023-02-20

## 2022-12-20 MED ORDER — TRIAMCINOLONE ACETONIDE 0.5 % EX OINT: 1.0000 | TOPICAL_OINTMENT | Freq: Two times a day (BID) | CUTANEOUS | 0 refills | Status: DC

## 2022-12-20 NOTE — Progress Notes (Signed)
   Acute Office Visit  Subjective:     Patient ID: Tara Howard, female    DOB: 1971-02-24, 52 y.o.   MRN: [id number]  Chief Complaint  Patient presents with   Insect Bite    HPI Patient is in today for had a insect sting on the left inner arm occurred this weekend.  She is incredibly itchy and sore.  She was outside and had just finished mowing the grass and was putting the equipment up.  I she does take Zyrtec-D once a day and she has been putting some hydrocortisone cream on it which helps temporarily ice packs help temporarily as well.  Says her whole left arm itches.  No shortness of breath or breathing issues.  ROS      Objective:    BP 131/82   Pulse 99   SpO2 97%    Physical Exam Skin:    Findings: Rash present.     Comments: She has a large erythematous area over that left inner arm.  The rash is well-demarcated.  Some mild swelling.  No active drainage.  She has some excoriations on the left forearm.     No results found for any visits on 12/20/22.      Assessment & Plan:   Problem List Items Addressed This Visit   None Visit Diagnoses     Bee sting, accidental or unintentional, initial encounter    -  Primary      Will treat with topical triamcinolone.  Encouraged her to bump up her antihistamine to twice a day and will add prednisone she can taper sooner if she is doing well.  Continue to use ice packs as needed.  Meds ordered this encounter  Medications   triamcinolone ointment (KENALOG) 0.5 %    Sig: Apply 1 Application topically 2 (two) times daily.    Dispense:  30 g    Refill:  0   predniSONE (DELTASONE) 20 MG tablet    Sig: Take 2 tablets (40 mg total) by mouth daily with breakfast.    Dispense:  10 tablet    Refill:  0    No follow-ups on file.  Nani Gasser, MD

## 2022-12-27 ENCOUNTER — Telehealth: Payer: Self-pay | Admitting: Gastroenterology

## 2022-12-27 NOTE — Telephone Encounter (Signed)
Inbound call from patient requesting to speak with a nurse . Patient stated that Linzess is not working for her..Please advise

## 2022-12-27 NOTE — Telephone Encounter (Signed)
Returned call to patient. Pt states that she has been on Linzess 290 mg for about a month now and has not seen any relief. Patient states that she only passes a small amount of stool when she takes Miralax BID. Patient states that she may have 1-2 BMs a week if that. Patient reports extreme bloating, some abdominal cramping and pain. Patient is passing gas without any issue. Seeking alternative medication at this time. She has tried and failed several medications for constipation in the past. Please advise, thanks.

## 2022-12-28 MED ORDER — MOTEGRITY 2 MG PO TABS: 2.0000 mg | ORAL_TABLET | Freq: Every day | ORAL | 3 refills | Status: DC

## 2022-12-28 NOTE — Telephone Encounter (Signed)
Pt called into the office to let us know that the Motegrity prescription is $1,400 for 1 month supply.

## 2022-12-28 NOTE — Telephone Encounter (Signed)
Called and spoke with patient regarding recommendations. Patient did not try Motegrity in the past due to cost, pt does not remember how much it was. I told her that I did not know how much it would cost but the pharmacy could let her know. I have sent to mail order pharmacy, pt will find out price and if it cost prohibitive she will let us know. In the interim pt has been advised to complete Miralax bowel purge for symptomatic relief. Patient verbalized understanding and had no concerns at the end of the call.

## 2022-12-29 ENCOUNTER — Other Ambulatory Visit (HOSPITAL_COMMUNITY): Payer: Self-pay

## 2022-12-30 ENCOUNTER — Other Ambulatory Visit: Payer: Self-pay

## 2022-12-30 MED ORDER — GABAPENTIN 300 MG PO CAPS: 300.0000 mg | ORAL_CAPSULE | Freq: Three times a day (TID) | ORAL | 2 refills | Status: DC

## 2022-12-30 NOTE — Telephone Encounter (Signed)
Patient called -  states she gets gabapentin prescribed by another doctor -( uses for hot flashes)  she is completely out of medication.  States was shipped to her door and was discarded with ice pack- States the provider who normally prescribes this is closed today Requesting a rx rf from Dr. Linford Arnold - She states she is aware that insurance will not cover the early fill and will pay out of pocket for medication  She states she can not be out of this medication. Requesting a call back.

## 2023-01-02 ENCOUNTER — Ambulatory Visit: Payer: BC Managed Care – PPO | Admitting: Medical-Surgical

## 2023-01-02 NOTE — Telephone Encounter (Signed)
Spoke to patient - she was able to find the medication in the trash and use it.

## 2023-01-05 ENCOUNTER — Other Ambulatory Visit: Payer: Self-pay | Admitting: Sports Medicine

## 2023-01-05 ENCOUNTER — Other Ambulatory Visit (HOSPITAL_COMMUNITY): Payer: Self-pay

## 2023-01-05 ENCOUNTER — Other Ambulatory Visit: Payer: Self-pay | Admitting: Physician Assistant

## 2023-01-05 DIAGNOSIS — M7062 Trochanteric bursitis, left hip: Secondary | ICD-10-CM

## 2023-01-05 DIAGNOSIS — J4541 Moderate persistent asthma with (acute) exacerbation: Secondary | ICD-10-CM

## 2023-01-05 DIAGNOSIS — J209 Acute bronchitis, unspecified: Secondary | ICD-10-CM

## 2023-01-05 NOTE — Telephone Encounter (Signed)
Patient assistance application has been mailed to pt and provider portion has been faxed to Dr. Barron Alvine for Motegrity through [place].

## 2023-01-11 ENCOUNTER — Ambulatory Visit: Payer: BC Managed Care – PPO | Admitting: Internal Medicine

## 2023-01-14 ENCOUNTER — Other Ambulatory Visit: Payer: Self-pay | Admitting: Family Medicine

## 2023-01-14 DIAGNOSIS — F5101 Primary insomnia: Secondary | ICD-10-CM

## 2023-01-27 NOTE — Telephone Encounter (Signed)
Provider portion is faxed to [facility]. Called patient to get an update to patient portion. Stated she is not going to apply for Patient assistance. She is doing much better with prebiotic and probiotic and doesn't have to take any of the medications for constipation. Informed her that we will cancel the process for Patient assstance from [place] at this time.  [employer] and cancelled the process.

## 2023-02-07 ENCOUNTER — Other Ambulatory Visit: Payer: Self-pay | Admitting: Family Medicine

## 2023-02-07 DIAGNOSIS — F5101 Primary insomnia: Secondary | ICD-10-CM

## 2023-02-15 ENCOUNTER — Ambulatory Visit: Payer: BC Managed Care – PPO | Admitting: Internal Medicine

## 2023-02-20 ENCOUNTER — Encounter: Payer: Self-pay | Admitting: Medical-Surgical

## 2023-02-20 ENCOUNTER — Ambulatory Visit: Payer: BC Managed Care – PPO | Admitting: Medical-Surgical

## 2023-02-20 VITALS — BP 132/91 | HR 84 | Resp 20 | Ht 64.0 in | Wt 155.9 lb

## 2023-02-20 DIAGNOSIS — B370 Candidal stomatitis: Secondary | ICD-10-CM | POA: Diagnosis not present

## 2023-02-20 MED ORDER — FLUCONAZOLE 100 MG PO TABS: ORAL_TABLET | ORAL | 0 refills | Status: AC

## 2023-02-20 NOTE — Progress Notes (Signed)
        Established patient visit  History, exam, impression, and plan:  1. Oral pharyngeal candidiasis Pleasant 52 year old female presenting today with reports of oral irritation and tenderness to the mouth.  This originally started on Friday or Saturday and has continued to worsen until now her throat is also involved.  Chewing and swallowing are uncomfortable.  Notes this happened after she had a delay between using her Symbicort inhaler and being able to rinse her mouth out.  She was seen on 727 at urgent care where they prescribed her nystatin suspension however she has been unable to get this from her pharmacy due to backorder.  Today she reports that her symptoms remain consistent and she has not used any other home remedies or over-the-counter medications to treat.  On exam, tongue, cheeks, posterior oropharyngeal area is all erythematous and irritated with white spots consistent with candidiasis.  Start Diflucan 200 mg today followed by 100 mg daily for 6 days.  Make sure to rinse thoroughly immediately after using the steroid inhaler in the future to prevent recurrences.   Procedures performed this visit: None.  Return if symptoms worsen or fail to improve.  __________________________________ Thayer Ohm, DNP, APRN, FNP-BC Primary Care and Sports Medicine [facility]

## 2023-02-22 ENCOUNTER — Telehealth: Payer: Self-pay | Admitting: Medical-Surgical

## 2023-02-22 ENCOUNTER — Ambulatory Visit: Payer: BC Managed Care – PPO | Admitting: Internal Medicine

## 2023-02-22 NOTE — Telephone Encounter (Signed)
Patient called in requesting a different antibiotics. States that Diflucan is not helping her.  Please Advise.  [facility] [place], [place]

## 2023-02-28 ENCOUNTER — Telehealth: Payer: Self-pay

## 2023-02-28 NOTE — Telephone Encounter (Signed)
Tailor called and left a message. She wanted to know if thrush would cause her to have a red strip/line on her tongue?

## 2023-02-28 NOTE — Telephone Encounter (Signed)
Apically the edges around thrush can be erythematous.  But if it is just the center crease that is red that may be different.  It may just be some inflammation of the papillae which can also look more red and irritated.

## 2023-03-01 NOTE — Telephone Encounter (Signed)
Patient informed and states the site is less red in appearance today. She will give this some more time.

## 2023-03-04 ENCOUNTER — Other Ambulatory Visit: Payer: Self-pay | Admitting: Family Medicine

## 2023-03-05 ENCOUNTER — Other Ambulatory Visit: Payer: Self-pay | Admitting: Family Medicine

## 2023-03-07 ENCOUNTER — Telehealth: Payer: Self-pay | Admitting: General Practice

## 2023-03-07 NOTE — Transitions of Care (Post Inpatient/ED Visit) (Signed)
03/07/2023  Name: Tara Howard MRN: [id number] DOB: October 07, 1970  Today's TOC FU Call Status: Today's TOC FU Call Status:: Successful TOC FU Call Completed TOC FU Call Complete Date: 03/07/23  Transition Care Management Follow-up Telephone Call Date of Discharge: 03/07/23 Discharge Facility: Other ([facility]) Name of Other ([facility]) Discharge Facility: [facility] Type of Discharge: Emergency Department Reason for ED Visit: Orthopedic Conditions Orthopedic/Injury Diagnosis: Sprain or Strain How have you been since you were released from the hospital?: Same Any questions or concerns?: No  Items Reviewed: Did you receive and understand the discharge instructions provided?: Yes Medications obtained,verified, and reconciled?: Yes (Medications Reviewed) Any new allergies since your discharge?: No Dietary orders reviewed?: NA Do you have support at home?: Yes  Medications Reviewed Today: Medications Reviewed Today     Reviewed by Modesto Charon, RN (Registered Nurse) on 03/07/23 at 1255  Med List Status: <None>   Medication Order Taking? Sig Documenting Provider Last Dose Status Informant  0.9 %  sodium chloride infusion [id number]   Cirigliano, Vito V, DO  Active   acetaminophen (TYLENOL) 500 MG tablet [id number] No Take 500 mg by mouth every 6 (six) hours as needed. [provider] Taking Active   budesonide (PULMICORT FLEXHALER) 180 MCG/ACT inhaler [id number] No Inhale 2 puffs into the lungs in the morning and at bedtime. Birder Robson, MD Taking Active   celecoxib (CELEBREX) 200 MG capsule [id number] No TAKE 1-2 TABLETS BY MOUTH DAILY AS NEEDED FOR PAIN. Monica Becton, MD Taking Active   gabapentin (NEURONTIN) 300 MG capsule [id number] No Take 1 capsule (300 mg total) by mouth 3 (three) times daily. Agapito Games, MD Taking Active   levalbuterol [facility]) 45 MCG/ACT inhaler [id number] No Inhale 1-2 puffs into the lungs every 6 (six) hours as needed for  wheezing or shortness of breath. Birder Robson, MD Taking Active   meclizine (ANTIVERT) 25 MG tablet [id number] No Take 1 tablet (25 mg total) by mouth 3 (three) times daily as needed for dizziness. Charlton Amor, DO Taking Active   montelukast (SINGULAIR) 10 MG tablet [id number] No TAKE 1 TABLET BY MOUTH EVERYDAY AT BEDTIME Breeback, Jade L, PA-C Taking Active   Multiple Vitamins-Minerals (HAIR SKIN AND NAILS FORMULA PO) [id number] No Take 2 tablets by mouth daily. [provider] Taking Active   nortriptyline ([name]) 50 MG capsule [id number] No Take 1 capsule (50 mg total) by mouth at bedtime. Cirigliano, Vito V, DO Taking Active   QULIPTA 60 MG TABS [id number] No Take 1 tablet by mouth daily. [provider] Taking Active   RABEprazole (ACIPHEX) 20 MG tablet [id number] No Take 1 tablet (20 mg total) by mouth 2 (two) times daily in the am and at bedtime.Barron Alvine, Vito V, DO Taking Active   tamoxifen (NOLVADEX) 20 MG tablet [id number] No Take 1 tablet by mouth daily. [provider] Taking Active   traZODone (DESYREL) 100 MG tablet [id number] No TAKE 1 & 1/2 TABLETS (150MG  TOTAL) BY MOUTH AT BEDTIME Agapito Games, MD Taking Active   triamcinolone (NASACORT) 55 MCG/ACT AERO nasal inhaler [id number] No Place 2 sprays into the nose daily. Birder Robson, MD Taking Active   triamcinolone ointment (KENALOG) 0.5 % [id number] No Apply 1 Application topically 2 (two) times daily. Agapito Games, MD Taking Active   venlafaxine [facility]) 75 MG tablet [id number] No TAKE 1 TABLET (75 MG TOTAL) BY MOUTH DAILY. NEEDS APPT Agapito Games, MD Taking Active  Home Care and Equipment/Supplies: Were Home Health Services Ordered?: NA Any new equipment or medical supplies ordered?: NA  Functional Questionnaire: Do you need assistance with bathing/showering or dressing?: No Do you need assistance with meal preparation?: No Do you need assistance with  eating?: No Do you have difficulty maintaining continence: No Do you need assistance with getting out of bed/getting out of a chair/moving?: No Do you have difficulty managing or taking your medications?: No  Follow up appointments reviewed: PCP Follow-up appointment confirmed?: Yes Date of PCP follow-up appointment?: 03/16/23 Follow-up Provider: Dr. Karie Schwalbe Specialist [facility] Follow-up appointment confirmed?: NA Do you need transportation to your follow-up appointment?: No Do you understand care options if your condition(s) worsen?: Yes-patient verbalized understanding  SDOH Interventions Today    Flowsheet Row Most Recent Value  SDOH Interventions   Transportation Interventions Intervention Not Indicated       SIGNATURE Modesto Charon, RN BSN Nurse Health Advisor

## 2023-03-08 ENCOUNTER — Telehealth: Payer: Self-pay | Admitting: Family Medicine

## 2023-03-08 MED ORDER — GABAPENTIN 300 MG PO CAPS: 300.0000 mg | ORAL_CAPSULE | Freq: Every day | ORAL | 1 refills | Status: DC

## 2023-03-08 NOTE — Addendum Note (Signed)
Addended by: Deno Etienne on: 03/08/2023 12:14 PM   Modules accepted: Orders

## 2023-03-08 NOTE — Telephone Encounter (Signed)
Medication switched to [facility] in [place]

## 2023-03-08 NOTE — Telephone Encounter (Signed)
Patient called she is [facility] in [facility] Phone number is [phone] Please resubmit gabapentin 300mg  capsule

## 2023-03-10 ENCOUNTER — Ambulatory Visit (INDEPENDENT_AMBULATORY_CARE_PROVIDER_SITE_OTHER): Payer: BC Managed Care – PPO

## 2023-03-10 ENCOUNTER — Ambulatory Visit: Payer: BC Managed Care – PPO | Admitting: Family Medicine

## 2023-03-10 ENCOUNTER — Encounter: Payer: Self-pay | Admitting: Family Medicine

## 2023-03-10 VITALS — BP 133/82 | HR 88 | Ht 64.0 in | Wt 156.0 lb

## 2023-03-10 DIAGNOSIS — S93401A Sprain of unspecified ligament of right ankle, initial encounter: Secondary | ICD-10-CM

## 2023-03-10 DIAGNOSIS — W108XXA Fall (on) (from) other stairs and steps, initial encounter: Secondary | ICD-10-CM | POA: Diagnosis not present

## 2023-03-10 DIAGNOSIS — M79671 Pain in right foot: Secondary | ICD-10-CM | POA: Diagnosis not present

## 2023-03-10 DIAGNOSIS — R42 Dizziness and giddiness: Secondary | ICD-10-CM | POA: Diagnosis not present

## 2023-03-10 MED ORDER — AMBULATORY NON FORMULARY MEDICATION: 0 refills | Status: DC

## 2023-03-10 MED ORDER — MECLIZINE HCL 25 MG PO TABS
25.0000 mg | ORAL_TABLET | Freq: Three times a day (TID) | ORAL | 1 refills | Status: DC | PRN
Start: 2023-03-10 — End: 2023-08-17

## 2023-03-10 NOTE — Progress Notes (Signed)
No fracture in the foot thus far.  Still awaiting ankle film.  Try to really get it elevated and ice this weekend try to stay off of it is much as she can.

## 2023-03-10 NOTE — Progress Notes (Signed)
Acute Office Visit  Subjective:     Patient ID: Tara Howard, female    DOB: March 07, 1971, 52 y.o.   MRN: [id number]  Chief Complaint  Patient presents with   Follow-up    Pt fell on her steps on 8/13 and injured her R ankle she was seen at the ED at [facility] she reports that she heard a "pop" and felt immediate pain in her ankle. The xray showed no acute fracture. She is currently in a walking boot.   She reports that she is unable to move her toes. At times she can twitch her big toe but mostly its numb and all of her toes feel tingly. If she attempts to move her ankle its very painful.  She has been propping her foot and icing when she gets home from school.     HPI Patient is in today for right foot injury and pain. Fell down stairs on 8/13.  She somehow twisted and actually ended up landing on her back on the floor. Went ot ED on 8/13. Xray neg and given boot.  Here today bc having hard time moving toes in that foot.  Has been working and standing on her feet a lot since the injury though she is wearing a long boot.   Pt fell on her steps on 8/13 and injured her R ankle she was seen at the ED at [facility] she reports that she heard a "pop" and felt immediate pain in her ankle. The xray showed no acute fracture. She is currently in a walking boot. She reports that she is unable to move her toes. At times she can twitch her big toe but mostly its numb and all of her toes feel tingly. If she attempts to move her ankle its very painful. She has been propping her foot and icing when she gets home from school.    Note she has had 2 prior fractures to that same ankle.  ROS      Objective:    BP 133/82   Pulse 88   Ht 5\' 4"  (1.626 m)   Wt 156 lb (70.8 kg)   SpO2 99%   BMI 26.78 kg/m    Physical Exam Musculoskeletal:     Comments: Right ankle very swollen medially and laterally with a line of bruising along the medial heel . Bruising down the top of the foot and along the toes.   Able to wiggle her great toe side to side    unable to flex or extend her ankle.       No results found for any visits on 03/10/23.      Assessment & Plan:   Problem List Items Addressed This Visit   None Visit Diagnoses     Sprain of right ankle, unspecified ligament, initial encounter    -  Primary   Relevant Orders   DG Ankle Complete Right (Completed)   DG Foot Complete Right (Completed)   MR ANKLE RIGHT WO CONTRAST   MR FOOT RIGHT WO CONTRAST   Right foot pain       Relevant Orders   DG Ankle Complete Right (Completed)   DG Foot Complete Right (Completed)   MR ANKLE RIGHT WO CONTRAST   MR FOOT RIGHT WO CONTRAST   Dizziness       Relevant Medications   meclizine (ANTIVERT) 25 MG tablet   Fall down stairs, initial encounter       Relevant Orders   MR ANKLE RIGHT  WO CONTRAST   MR FOOT RIGHT WO CONTRAST      Ankle and foot injury-I am quite concerned that she is unable to dorsiflex or plantarflex the ankle or you to use her toes.  We discussed options.  I would like to repeat plain films today just to rule out fracture she did hear a pop during the injury.  Unfortunately she has continued to work this week because she really has not been able to not work.  She has been wearing the boot but is still been walking around 02-8999 steps per day.  Encouraged her to continue to work on icing elevation over the weekend gave her a work note to allow her to not stand for more than 10 to 15 minutes at a time.  Will likely need MRI for further workup.  Meds ordered this encounter  Medications   AMBULATORY NON FORMULARY MEDICATION    Sig: Medication Name: Knee scooter.  Dx Severe Right foot sprain.    Dispense:  1 Units    Refill:  0   meclizine (ANTIVERT) 25 MG tablet    Sig: Take 1 tablet (25 mg total) by mouth 3 (three) times daily as needed for dizziness.    Dispense:  30 tablet    Refill:  1    No follow-ups on file.  Nani Gasser, MD

## 2023-03-10 NOTE — Progress Notes (Signed)
X-ray not show an acute fracture just the old one.  Will move forward with MRI.

## 2023-03-15 ENCOUNTER — Telehealth: Payer: Self-pay | Admitting: Family Medicine

## 2023-03-15 ENCOUNTER — Encounter: Payer: Self-pay | Admitting: Internal Medicine

## 2023-03-15 ENCOUNTER — Ambulatory Visit: Payer: BC Managed Care – PPO | Admitting: Internal Medicine

## 2023-03-15 VITALS — BP 124/80 | HR 94 | Temp 98.4°F | Resp 20

## 2023-03-15 DIAGNOSIS — J329 Chronic sinusitis, unspecified: Secondary | ICD-10-CM

## 2023-03-15 DIAGNOSIS — J4541 Moderate persistent asthma with (acute) exacerbation: Secondary | ICD-10-CM

## 2023-03-15 DIAGNOSIS — J3089 Other allergic rhinitis: Secondary | ICD-10-CM

## 2023-03-15 DIAGNOSIS — J4531 Mild persistent asthma with (acute) exacerbation: Secondary | ICD-10-CM

## 2023-03-15 DIAGNOSIS — J209 Acute bronchitis, unspecified: Secondary | ICD-10-CM

## 2023-03-15 DIAGNOSIS — J0101 Acute recurrent maxillary sinusitis: Secondary | ICD-10-CM

## 2023-03-15 MED ORDER — LEVALBUTEROL TARTRATE 45 MCG/ACT IN AERO: 1.0000 | INHALATION_SPRAY | Freq: Four times a day (QID) | RESPIRATORY_TRACT | 1 refills | Status: DC | PRN

## 2023-03-15 MED ORDER — MONTELUKAST SODIUM 10 MG PO TABS
10.0000 mg | ORAL_TABLET | Freq: Every day | ORAL | 1 refills | Status: DC
Start: 2023-03-15 — End: 2023-08-17

## 2023-03-15 MED ORDER — METHYLPREDNISOLONE ACETATE 40 MG/ML IJ SUSP
40.0000 mg | Freq: Once | INTRAMUSCULAR | Status: AC
Start: 2023-03-15 — End: 2023-03-15
  Administered 2023-03-15: 40 mg via INTRAMUSCULAR

## 2023-03-15 MED ORDER — BUDESONIDE 0.5 MG/2ML IN SUSP: RESPIRATORY_TRACT | 5 refills | Status: DC

## 2023-03-15 MED ORDER — SULFAMETHOXAZOLE-TRIMETHOPRIM 800-160 MG PO TABS: 1.0000 | ORAL_TABLET | Freq: Two times a day (BID) | ORAL | 0 refills | Status: AC

## 2023-03-15 MED ORDER — PULMICORT FLEXHALER 180 MCG/ACT IN AEPB: 2.0000 | INHALATION_SPRAY | Freq: Two times a day (BID) | RESPIRATORY_TRACT | 5 refills | Status: DC

## 2023-03-15 MED ORDER — PREDNISONE 20 MG PO TABS: 20.0000 mg | ORAL_TABLET | Freq: Every day | ORAL | 0 refills | Status: AC

## 2023-03-15 NOTE — Progress Notes (Signed)
FOLLOW UP Date of Service/Encounter:  03/15/23  Subjective:  Tara Howard (DOB: 11-12-1970) is a 52 y.o. female who [facility] in re-evaluation of the following: Chronic rhinitis suspected allergic, asthma. History obtained from: chart review and patient.  For Review, LV was on 11/16/22  with Dr. Allena Katz seen for intial visit for Asthma and allergic rhinitis . See below for summary of history and diagnostics.   Therapeutic plans/changes recommended: continue pulmicort, start Astelin, Flonase nasal sprays.  Discontinue Singulair.  Continue Zyrtec. ----------------------------------------------------- Pertinent History/Diagnostics:  Asthma: Diagnosed in mid teens, worsened over the last several years.  6 courses OCS in the past year.  No previous hospitalizations. Identified triggers: Exercise, respiratory illness, allergies. Meds tried: Breo/Trelegy without relief, Qvar worked well but stopped due to insurance.  Has also tried Symbicort and Advair, but did not find them effective. Current meds: Pulmicort-feels improved -Normal spirometry (11/16/2022): ratio 84%, 93% FEV1 Allergic Rhinitis:  Started in childhood, worse in the last year.  Symptoms include nasal congestion, rhinorrhea, postnasal drip, sneezing, watery eyes, itchy eyes.  Failed multiple courses of antibiotic/prednisone in the last year.  Diagnosed fall winter with sinusitis.  Has mold in her home. Has reflux on Rabeprazole.Also on nortriptyline for esophageal sensitivity  No prior sinus surgeries Has seen ENT, and treated with 3 weeks of doxycycline for chronic sinusitis in spring 2024.  No scheduled follow-up. Other: fibromyalgia, IBS, GERD, migraines, anxiety and depression. Works at school system. --------------------------------------------------- Today presents for follow-up. She stopped her allergy medication on Saturday.  She was hoping to come for allergy testing.   However, since this occurred, has had an increase in sinus symptoms.  She is having chest congestion, sinus congestion, sinus pain and pressure. A lot of coughing, coughing up green mucus. She is using her pulmicort twice daily.  Was on 3 weeks course of antibiotics by her ENT in April/May for chronic sinusitis-treated with doxycycline. She did not take prednisone with this. She does feel occasional shortness of breath, but continues with coughing.  Coughing was prior to Saturday, but has escalated since Saturday. No fevers.  No sick symptoms. She has not had any sinus imagining. No planned follow-up with ENT. Has a nebulizer, but feels it made her cough worsened.  Has not tried during recent illness.  She would like to consider allergy injections if she is a candidate.  All medications reviewed by clinical staff and updated in chart. No new pertinent medical or surgical history except as noted in HPI.  ROS: All others negative except as noted per HPI.   Objective:  BP 124/80 (BP Location: Right Arm, Patient Position: Sitting, Cuff Size: Normal)   Pulse 94   Temp 98.4 F (36.9 C) (Temporal)   Resp 20   SpO2 97%  There is no height or weight on file to calculate BMI. Physical Exam: General Appearance:  Alert, cooperative, no distress, appears stated age  Head:  Normocephalic, without obvious abnormality, atraumatic  Eyes:  Conjunctiva clear, EOM's intact  Ears Left TM with serous otitis media and EACs normal bilaterally  Nose: Nares normal, hypertrophic turbinates, normal mucosa, and no visible anterior polyps  Throat: Lips, tongue normal; teeth and gums normal, normal posterior oropharynx and + cobblestoning  Neck: Supple, symmetrical  Lungs:   clear to auscultation bilaterally, Respirations unlabored, intermittent dry coughing  Heart:  regular rate and rhythm and no murmur, Appears well perfused  Extremities: No edema  Skin: Skin color, texture, turgor  normal and no rashes or  lesions on visualized portions of skin  Neurologic: No gross deficits   Labs:  Lab Orders  No laboratory test(s) ordered today    Spirometry:  Tracings reviewed. Her effort: Good reproducible efforts. FVC: 3.73L FEV1: 3.23L, 116% predicted FEV1/FVC ratio: 0.87 Interpretation:  Nonobstructive ratio, scooping noted on flow volume loop .  Please see scanned spirometry results for details.   Assessment/Plan   Tara Howard Return in about 3 months (around 06/15/2023).    Mild Persistent Asthma: with exacerbation thought secondary to acute sinusitis - 40 mg IM depo today - tomorrow start prednisone 40 mg daily for 4 days - today start bactrim 800 mg twice daily for 10 days-take with probiotics. -add pulmicort (budesonide) 0.5 mg daily in addition to pulmicort flexhaler 180 mcg 2 puffs BID and continue for 1-2 weeks or until symptoms improve. -labs to evaluate immune system - Maintenance inhaler: continue Pulmicort Flexhaler 2 puffs twice daily.  - Rescue inhaler: Xopenex 1-2 puffs every 4-6 hours as needed for respiratory symptoms of cough, shortness of breath, or wheezing Asthma control goals:  Full participation in all desired activities (may need albuterol before activity) Albuterol use two times or less a week on average (not counting use with activity) Cough interfering with sleep two times or less a month Oral steroids no more than once a year No hospitalizations   Chronic Rhinitis -suspected allergic; not at goal -labs today to look at environmental allergies. - Use nasal saline rinses before nose sprays such as with Neilmed Sinus Rinse.  Use distilled water.   - Use Nasacort 2  sprays each nostril daily. Aim upward and outward. - Use Azelastine 1-2 sprays each nostril twice daily. Aim upward and outward. - Use Zyrtec 10 mg daily or Allegra 180mg  daily or Xyzal 5mg  daily.   -  Singulair 10mg  daily.   - For eyes, use Olopatadine or Ketotifen 1 eye drop daily  as needed for itchy, watery eyes.  Available over the counter, if not covered by insurance.  - Consider retesting in the future. Hold all anti-histamines (Zyrtec/Azelastine/Olopatadine/Alaway-Ketotifen) 3 days prior, if interested.     Follow up : 3 months, sooner if needed It was a pleasure meeting you in clinic today! Thank you for allowing me to participate in your care.  Other:  40 mg IM depo given.  Tonny Bollman, MD  [facility]

## 2023-03-15 NOTE — Telephone Encounter (Signed)
We have already been working on it.  It is still under review as far as I know

## 2023-03-15 NOTE — Patient Instructions (Addendum)
Tara Howard Return in about 3 months (around 06/15/2023).    Mild Persistent Asthma: with exacerbation thought secondary to acute sinusitis - 40 mg IM depo today - tomorrow start prednisone 40 mg daily for 4 days - today start bactrim 800 mg twice daily for 10 days-take with probiotics. -add pulmicort (budesonide) 0.5 mg daily in addition to pulmicort flexhaler 180 mcg 2 puffs BID and continue for 1-2 weeks or until symptoms improve. -labs to evaluate immune system - Maintenance inhaler: continue Pulmicort Flexhaler 2 puffs twice daily.  - Rescue inhaler: Xopenex 1-2 puffs every 4-6 hours as needed for respiratory symptoms of cough, shortness of breath, or wheezing Asthma control goals:  Full participation in all desired activities (may need albuterol before activity) Albuterol use two times or less a week on average (not counting use with activity) Cough interfering with sleep two times or less a month Oral steroids no more than once a year No hospitalizations   Chronic Rhinitis -suspected allergic -labs today to look at environmental allergies. - Use nasal saline rinses before nose sprays such as with Neilmed Sinus Rinse.  Use distilled water.   - Use Nasacort 2  sprays each nostril daily. Aim upward and outward. - Use Azelastine 1-2 sprays each nostril twice daily. Aim upward and outward. - Use Zyrtec 10 mg daily or Allegra 180mg  daily or Xyzal 5mg  daily.   - Singulair 10mg  daily.   - For eyes, use Olopatadine or Ketotifen 1 eye drop daily as needed for itchy, watery eyes.  Available over the counter, if not covered by insurance.  - Consider retesting in the future. Hold all anti-histamines (Zyrtec/Azelastine/Olopatadine/Alaway-Ketotifen) 3 days prior, if interested.     Follow up : 3 months, sooner if needed It was a pleasure meeting you in clinic today! Thank you for allowing me to participate in your care.  Tonny Bollman, MD [facility]

## 2023-03-15 NOTE — Telephone Encounter (Signed)
Patient called she is asking for a PA on an MRI or Right Foot and Ankle please advise Thank you

## 2023-03-15 NOTE — Telephone Encounter (Signed)
Authorization obtained for MRI ANKLE. Valid from 03/15/23 to 04/13/23. Per insurance, MRI FOOT is considered a duplicate request for the same area of the body within a 60 days period. Unable to auth the request at this time. Patient can contact imaging directly at [phone] to complete the MRI ANKLE study.

## 2023-03-16 ENCOUNTER — Ambulatory Visit: Payer: BC Managed Care – PPO | Admitting: Sports Medicine

## 2023-03-16 ENCOUNTER — Telehealth: Payer: Self-pay | Admitting: Family Medicine

## 2023-03-16 ENCOUNTER — Other Ambulatory Visit: Payer: Self-pay | Admitting: Family Medicine

## 2023-03-16 DIAGNOSIS — R42 Dizziness and giddiness: Secondary | ICD-10-CM

## 2023-03-16 NOTE — Telephone Encounter (Signed)
Pt called. She is following up on refill on her meclizine.

## 2023-03-16 NOTE — Telephone Encounter (Signed)
Medication was sent to [facility] in [place].

## 2023-03-18 ENCOUNTER — Other Ambulatory Visit: Payer: BC Managed Care – PPO

## 2023-03-19 LAB — CBC WITH DIFFERENTIAL/PLATELET
Basophils Absolute: 0 10*3/uL (ref 0.0–0.2)
Basos: 1 %
EOS (ABSOLUTE): 0.1 10*3/uL (ref 0.0–0.4)
Eos: 2 %
Hematocrit: 38.5 % (ref 34.0–46.6)
Hemoglobin: 12.7 g/dL (ref 11.1–15.9)
Immature Grans (Abs): 0 10*3/uL (ref 0.0–0.1)
Immature Granulocytes: 0 %
Lymphocytes Absolute: 2 10*3/uL (ref 0.7–3.1)
Lymphs: 43 %
MCH: 30.8 pg (ref 26.6–33.0)
MCHC: 33 g/dL (ref 31.5–35.7)
MCV: 93 fL (ref 79–97)
Monocytes Absolute: 0.5 10*3/uL (ref 0.1–0.9)
Monocytes: 10 %
Neutrophils Absolute: 1.9 10*3/uL (ref 1.4–7.0)
Neutrophils: 44 %
Platelets: 222 10*3/uL (ref 150–450)
RBC: 4.13 x10E6/uL (ref 3.77–5.28)
RDW: 12.7 % (ref 11.7–15.4)
WBC: 4.5 10*3/uL (ref 3.4–10.8)

## 2023-03-19 LAB — ALLERGENS, ZONE 2

## 2023-03-19 LAB — STREP PNEUMONIAE 23 SEROTYPES IGG
Pneumo Ab Type 1*: 0.6 ug/mL — ABNORMAL LOW (ref >1.3)
Pneumo Ab Type 12 (12F)*: <0.1 ug/mL — ABNORMAL LOW (ref >1.3)
Pneumo Ab Type 14*: <0.1 ug/mL — ABNORMAL LOW (ref >1.3)
Pneumo Ab Type 17 (17F)*: 1.3 ug/mL — ABNORMAL LOW (ref >1.3)
Pneumo Ab Type 19 (19F)*: 0.8 ug/mL — ABNORMAL LOW (ref >1.3)
Pneumo Ab Type 2*: 1.3 ug/mL — ABNORMAL LOW (ref >1.3)
Pneumo Ab Type 20*: 0.5 ug/mL — ABNORMAL LOW (ref >1.3)
Pneumo Ab Type 22 (22F)*: <0.1 ug/mL — ABNORMAL LOW (ref >1.3)
Pneumo Ab Type 23 (23F)*: 1.2 ug/mL — ABNORMAL LOW (ref >1.3)
Pneumo Ab Type 26 (6B)*: <0.1 ug/mL — ABNORMAL LOW (ref >1.3)
Pneumo Ab Type 3*: 0.1 ug/mL — ABNORMAL LOW (ref >1.3)
Pneumo Ab Type 34 (10A)*: 0.1 ug/mL — ABNORMAL LOW (ref >1.3)
Pneumo Ab Type 4*: <0.1 ug/mL — ABNORMAL LOW (ref >1.3)
Pneumo Ab Type 43 (11A)*: 0.4 ug/mL — ABNORMAL LOW (ref >1.3)
Pneumo Ab Type 5*: <0.1 ug/mL — ABNORMAL LOW (ref >1.3)
Pneumo Ab Type 51 (7F)*: <0.1 ug/mL — ABNORMAL LOW (ref >1.3)
Pneumo Ab Type 54 (15B)*: <0.2 ug/mL — ABNORMAL LOW (ref >1.3)
Pneumo Ab Type 56 (18C)*: 0.9 ug/mL — ABNORMAL LOW (ref >1.3)
Pneumo Ab Type 57 (19A)*: 6.5 ug/mL (ref >1.3)
Pneumo Ab Type 68 (9V)*: <0.1 ug/mL — ABNORMAL LOW (ref >1.3)
Pneumo Ab Type 70 (33F)*: 0.4 ug/mL — ABNORMAL LOW (ref >1.3)
Pneumo Ab Type 8*: <0.3 ug/mL — ABNORMAL LOW (ref >1.3)
Pneumo Ab Type 9 (9N)*: <0.1 ug/mL — ABNORMAL LOW (ref >1.3)

## 2023-03-19 LAB — IGG, IGA, IGM
IgA/Immunoglobulin A, Serum: 184 mg/dL (ref 87–352)
IgG (Immunoglobin G), Serum: 931 mg/dL (ref 586–1602)
IgM (Immunoglobulin M), Srm: 44 mg/dL (ref 26–217)

## 2023-03-19 LAB — DIPHTHERIA / TETANUS ANTIBODY PANEL
Diphtheria Ab: <0.1 IU/mL — ABNORMAL LOW (ref <0.10)
Tetanus Ab, IgG: 1.05 [IU]/mL (ref <0.10)

## 2023-03-20 ENCOUNTER — Other Ambulatory Visit: Payer: Self-pay | Admitting: Internal Medicine

## 2023-03-20 DIAGNOSIS — J329 Chronic sinusitis, unspecified: Secondary | ICD-10-CM

## 2023-03-20 NOTE — Progress Notes (Signed)
Please let patient know that her environmental allergy testing is negative.  Would recommend getting her set up for skin testing to confirm when she feels she is able to stop her antihistamines and is feeling better.  She would need to stop antihistamines for 3 days prior to that visit-would include the Astelin/Astepro/azelastine nasal spray as well as her oral antihistamine (Zyrtec, Claritin, Allegra, Xyzal, etc.). Her immune evaluation shows an adequate coverage to diphtheria as well as strep pneumonia vaccines.  I would recommend that she get a tetanus booster which will include diphtheria as well as the Pneumovax 23 vaccine.  Since she needs a tetanus booster, would have her request both of these vaccines at her primary care.  Once the vaccines are given, would repeat her lab work 6 weeks later to ensure proper immune response.  This may also help prevent some infections due to strep pneumonia.  Let me know if she has any questions or concerns.

## 2023-04-01 ENCOUNTER — Ambulatory Visit (INDEPENDENT_AMBULATORY_CARE_PROVIDER_SITE_OTHER): Payer: BC Managed Care – PPO

## 2023-04-01 DIAGNOSIS — M79671 Pain in right foot: Secondary | ICD-10-CM | POA: Diagnosis not present

## 2023-04-01 DIAGNOSIS — S93401A Sprain of unspecified ligament of right ankle, initial encounter: Secondary | ICD-10-CM | POA: Diagnosis not present

## 2023-04-01 DIAGNOSIS — W108XXA Fall (on) (from) other stairs and steps, initial encounter: Secondary | ICD-10-CM

## 2023-04-04 ENCOUNTER — Telehealth: Payer: Self-pay | Admitting: Family Medicine

## 2023-04-04 ENCOUNTER — Ambulatory Visit: Payer: BC Managed Care – PPO | Admitting: Physician Assistant

## 2023-04-04 ENCOUNTER — Encounter: Payer: Self-pay | Admitting: Physician Assistant

## 2023-04-04 VITALS — BP 138/88 | HR 101 | Ht 60.0 in | Wt 158.0 lb

## 2023-04-04 DIAGNOSIS — B370 Candidal stomatitis: Secondary | ICD-10-CM

## 2023-04-04 MED ORDER — NYSTATIN 100000 UNIT/ML MT SUSP: OROMUCOSAL | 1 refills | Status: DC

## 2023-04-04 MED ORDER — FLUCONAZOLE 150 MG PO TABS: ORAL_TABLET | ORAL | 0 refills | Status: DC

## 2023-04-04 MED ORDER — AMBULATORY NON FORMULARY MEDICATION
0 refills | Status: DC
Start: 2023-04-04 — End: 2023-04-12

## 2023-04-04 NOTE — Patient Instructions (Signed)

## 2023-04-04 NOTE — Telephone Encounter (Signed)
[facility] called in wanting more clarification on the rxnystatin (MYCOSTATIN) 100000 UNIT/ML suspension.

## 2023-04-04 NOTE — Progress Notes (Unsigned)
Acute Office Visit  Subjective:     Patient ID: Tara Howard, female    DOB: 12-Mar-1971, 52 y.o.   MRN: [id number]  Chief Complaint  Patient presents with   Medical Management of Chronic Issues    Oral thrush     HPI Patient is in today for thrush symptoms(painful discomfort over tongue and back of throat with white discharge)  in her mouth and oropharynx for the last 2 days. She has hx of thrush due to use of ICS inhaler. Last infection was over the summer. She does rinse after every use. Not tried anything to make current symptoms better. Her tongue and oropharynx is sore.   .. 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   Left ankle pain 08/29/2017   Genetic testing 09/28/2018   Malignant neoplasm of left female breast (HCC) 09/06/2018   Anxiety and depression 10/24/2018   Primary osteoarthritis of left hip 01/31/2019   Hot flash due to medication 02/19/2019   Encounter for monitoring tamoxifen therapy 09/28/2018   Increased risk of breast cancer 11/08/2018   Foot trauma, right, initial encounter 01/29/2020   Primary osteoarthritis of first carpometacarpal joint of left hand 09/08/2020   Acute non-recurrent pansinusitis 05/24/2021   Mild persistent asthma without complication 06/23/2021   Canker sores oral 12/01/2021   Screening examination for STD (sexually transmitted disease) 12/01/2021   Trochanteric bursitis, left hip 01/11/2022   COVID-19 06/01/2022   Subacute cough 09/12/2022   Rib pain on left side 09/12/2022   Resolved Ambulatory Problems    Diagnosis Date Noted   Acute maxillary sinusitis 05/28/2010   Acute bronchitis  05/21/2010   DYSPEPSIA 01/20/2010   Gastritis without bleeding 10/20/2010   Needs flu shot 03/25/2016   Acute recurrent maxillary sinusitis 08/29/2017   Sinobronchitis 12/13/2021   Acute pansinusitis 09/19/2022   Acute bacterial sinusitis 11/03/2022   Laryngitis 11/03/2022   Past Medical History:  Diagnosis Date   Allergy    Anxiety    Asthma    Breast cancer (HCC)    Chronic headache    Depression    GERD (gastroesophageal reflux disease)    IBS (irritable bowel syndrome)    Osteoarthritis      ROS See HPI.     Objective:    BP 138/88   Pulse (!) 101   Ht 5' (1.524 m)   Wt 158 lb (71.7 kg)   SpO2 99%   BMI 30.86 kg/m  BP Readings from Last 3 Encounters:  04/04/23 138/88  03/15/23 124/80  03/10/23 133/82   Wt Readings from Last 3 Encounters:  04/04/23 158 lb (71.7 kg)  03/10/23 156 lb (70.8 kg)  02/20/23 155 lb 14.4 oz (70.7 kg)      Physical Exam Constitutional:      Appearance: Normal appearance.  HENT:     Head: Normocephalic.  Cardiovascular:     Rate and Rhythm: Normal rate.     Pulses: Normal pulses.  Neurological:     General: No focal deficit present.     Mental Status: She is alert.  Psychiatric:        Mood and Affect: Mood normal.  Assessment & Plan:  .Marland KitchenJulaine was seen today for medical management of chronic issues.  Diagnoses and all orders for this visit:  Thrush -     fluconazole (DIFLUCAN) 150 MG tablet; Take one tablet for 7 days for thrush. -     nystatin (MYCOSTATIN) 100000 UNIT/ML suspension; Swish for 30 seconds and spit out for 7 days then up to once a week for prevention. -     AMBULATORY NON FORMULARY MEDICATION; Spacer to use with inhaler.   Start diflucan for 7 days Use nystatin mouthwash for prevention up to once a week as need Continue to rinse and spit Trial of using spacer to see if cuts down on thrush Follow up as needed    Tandy Gaw, PA-C

## 2023-04-06 ENCOUNTER — Other Ambulatory Visit: Payer: Self-pay | Admitting: *Deleted

## 2023-04-06 ENCOUNTER — Telehealth: Payer: Self-pay | Admitting: Physician Assistant

## 2023-04-06 DIAGNOSIS — B370 Candidal stomatitis: Secondary | ICD-10-CM

## 2023-04-06 MED ORDER — NYSTATIN 100000 UNIT/ML MT SUSP
OROMUCOSAL | 1 refills | Status: DC
Start: 2023-04-06 — End: 2023-06-13

## 2023-04-06 NOTE — Telephone Encounter (Signed)
Patient called in needing clarification for directions on her nystatin (MYCOSTATIN) 100000 UNIT/ML suspension , also wanted know how her spacer would work with her inhaler because it's an powder. Please advise

## 2023-04-06 NOTE — Progress Notes (Signed)
HI Tara Howard,  The ankle MRI shows some swelling around the back of the midfoot between 2 of the tendons.  Though it does not look like the tendons are damaged which is great.  You do have complete cartilage loss around part of the tibia.  This is the long bone in your lower leg that connects into your ankle.  You also have a little bit of thinning in other parts of your ankle as well of the cartilage itself.  They did not see any fracture which is reassuring.  But I would like to get you in with one of our sports med docs or orthopedist if you prefer to address the findings below which can come from arthritis.

## 2023-04-09 ENCOUNTER — Encounter: Payer: Self-pay | Admitting: Family Medicine

## 2023-04-12 ENCOUNTER — Encounter: Payer: Self-pay | Admitting: Internal Medicine

## 2023-04-12 ENCOUNTER — Ambulatory Visit: Payer: BC Managed Care – PPO | Admitting: Internal Medicine

## 2023-04-12 VITALS — BP 120/80 | HR 92 | Temp 98.1°F | Resp 16

## 2023-04-12 DIAGNOSIS — R058 Other specified cough: Secondary | ICD-10-CM | POA: Diagnosis not present

## 2023-04-12 DIAGNOSIS — J4530 Mild persistent asthma, uncomplicated: Secondary | ICD-10-CM | POA: Diagnosis not present

## 2023-04-12 DIAGNOSIS — Z79899 Other long term (current) drug therapy: Secondary | ICD-10-CM

## 2023-04-12 DIAGNOSIS — J310 Chronic rhinitis: Secondary | ICD-10-CM

## 2023-04-12 DIAGNOSIS — B999 Unspecified infectious disease: Secondary | ICD-10-CM

## 2023-04-12 NOTE — Progress Notes (Signed)
FOLLOW UP Date of Service/Encounter:  04/12/23  Subjective:  Tara Howard (DOB: 07-May-1971) is a 52 y.o. female who [facility] in re-evaluation of the following: Chronic rhinitis, chronic cough, recurrent sinusitis, asthma History obtained from: chart review and patient.  For Review, LV was on 03/15/23  with Dr.Naitik Hermann seen for acute visit for sinus infection . See below for summary of history and diagnostics.   Therapeutic plans/changes recommended: FEV1 116%, labs obtained for environmental allergens and negative.Given 40 mg IM depo and prednisone oral burst, bactrim 800 mg BID x 1 0days, add pulmicort 0.5 mg daily + pulmicort BID x 1-2 weeks.  We also ordered labs to look at her immune system. ----------------------------------------------------- Pertinent History/Diagnostics:  Asthma: Diagnosed in mid teens, worsened over the last several years.  6 courses OCS in the past year.  No previous hospitalizations. Identified triggers: Exercise, respiratory illness, allergies. Meds tried: Breo/Trelegy without relief, Qvar worked well but stopped due to insurance.  Has also tried Symbicort and Advair, but did not find them effective. Current meds: Pulmicort-feels improved -Normal spirometry (11/16/2022): ratio 84%, 93% FEV1 - labs environmental panel 03/15/23: negative Chronic rhinitis: with recurrent sinusitis. Started in childhood, worse in the last year.  Symptoms include nasal congestion, rhinorrhea, postnasal drip, sneezing, watery eyes, itchy eyes.  Failed multiple courses of antibiotic/prednisone in the last year.  Diagnosed fall winter with sinusitis.  Has mold in her home. Has reflux on Rabeprazole.Also on nortriptyline for esophageal sensitivity  No prior sinus surgeries Has seen ENT, and treated with 3 weeks of doxycycline for chronic sinusitis in spring 2024.  No scheduled follow-up. - immune evaluation via labs 03/15/23: adequate  response to tetanus, diptheria not protected, strep not adequate coverage, normal IgG, IgM, IgA. Other: fibromyalgia, IBS, GERD, migraines, anxiety and depression. 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 --------------------------------------------------- Today presents for follow-up. She fell down the stairs 6 weeks ago and is in a boot due to torn cartilage. She is still teaching Kindergarten.  Feels it is exhausting.  Her lab work was negative at last visit. She is taking her medicines as prescribed.  She feels mostly controlled if she uses these daily. She continues to have a cough that she can't get rid of.  She feels like it is an itchy sensation in her upper chest. She is constantly clearing her throat. She feels she has acid reflux. She is on nortriptyline due to esophageal sensitivity.  She continues to cough every few minutes if not using delsym. She is on rabeprazole for her reflux, has not followed with GI recently. She is also on gabapentin daily 300 mg three times daily as prescribed by her PCP.  She has not followed up with ENT recently.  She is using pulmicort flexhaler but this gives her constant thrush. She is not using xopenex very often, doesn't find it to be effective.   At her last visit, she did feel better after bactrim and prednisone.  She has not been able to get the pneumovax 23 vaccine.  She thinks she will be able to do her allergy testing in late fall when her symptoms are more stable.  She thinks she has black mold in her home, but due to her upcoming surgery for her foot, cannot afford remediation of this at this time.    All medications reviewed by clinical staff and updated in chart. No new pertinent medical or surgical history except as noted in HPI.  ROS: All others negative except as noted per HPI.   Objective:  BP 120/80 (BP Location: Right Arm, Patient Position: Sitting, Cuff Size: Normal)   Pulse 92   Temp 98.1 F (36.7 C) (Temporal)   Resp  16   SpO2 98%  There is no height or weight on file to calculate BMI. Physical Exam: General Appearance:  Alert, cooperative, no distress, appears stated age clearing throat throughout visit followed by a staccato cough  Head:  Normocephalic, without obvious abnormality, atraumatic  Eyes:  Conjunctiva clear, EOM's intact  Ears EACs normal bilaterally and normal TMs bilaterally  Nose: Nares normal, hypertrophic turbinates, normal mucosa, and no visible anterior polyps  Throat: Lips, tongue normal; teeth and gums normal, normal posterior oropharynx  Neck: Supple, symmetrical  Lungs:   clear to auscultation bilaterally, Respirations unlabored, intermittent dry coughing  Heart:  regular rate and rhythm and no murmur, Appears well perfused  Extremities: No edema  Skin: Skin color, texture, turgor normal and no rashes or lesions on visualized portions of skin  Neurologic: No gross deficits   Labs:  Lab Orders  No laboratory test(s) ordered today    Spirometry:  Tracings reviewed. Her effort: Good reproducible efforts. FVC: 3.60L FEV1: 2.91L, 105% predicted FEV1/FVC ratio: 0.81 Interpretation: Spirometry consistent with normal pattern.  Please see scanned spirometry results for details.  Assessment/Plan   Chronic cough-suspected mostly upper airway symptoms. -Continue gabapentin as prescribed as well as nortriptyline -Read handout on chronic throat clearing below. -Habit modifications reviewed  Mild Persistent Asthma: At goal, will trial stepping down therapy with strict instructions to return to previous dosing should asthma symptoms worsen.  Suspect her symptoms are more secondary to upper airway cough than lower. - Maintenance inhaler: decrease Pulmicort Flexhaler 1 puffs twice daily.  During asthma flares/respiratory infections: increase Pulmicort 180 mcg 2 puffs twice daily and continue for 2 weeks. - Rescue inhaler: Xopenex 1-2 puffs every 4-6 hours as needed for  respiratory symptoms of cough, shortness of breath, or wheezing Asthma control goals:  Full participation in all desired activities (may need albuterol before activity) Albuterol use two times or less a week on average (not counting use with activity) Cough interfering with sleep two times or less a month Oral steroids no more than once a year No hospitalizations   Chronic Rhinitis -suspected allergic with recurrent sinusitis -call us to schedule skin testing once you feel you can come off your allergy meds for at least 3 days. - Use nasal saline rinses before nose sprays such as with Neilmed Sinus Rinse.  Use distilled water.   - Use Nasacort 2  sprays each nostril daily. Aim upward and outward. - Use Azelastine 1-2 sprays each nostril twice daily. Aim upward and outward. - Use Zyrtec 10 mg daily or Allegra 180mg  daily or Xyzal 5mg  daily.   - Singulair 10mg  daily.   - For eyes, use Olopatadine or Ketotifen 1 eye drop daily as needed for itchy, watery eyes.  Available over the counter, if not covered by insurance.  - Consider retesting in the future. Hold all anti-histamines (Zyrtec/Azelastine/Olopatadine/Alaway-Ketotifen) 3 days prior, if interested.    Reflux:  - continue GI follow-up and rabeprazole daily.  Recurrent sinusitis:  - your immune evaluation showed inadequate titers to strep pneumonia vaccine and diptheria - call your primary doctor to get this vaccines (Pneumovax 23 and diptheria booster) and let us know so that we can repeat your titers 6 weeks later  Follow up : 6 months,  sooner if needed It was a pleasure seeing you again in clinic today! Thank you for allowing me to participate in your care.  Other: None  Tonny Bollman, MD  [facility]

## 2023-04-12 NOTE — Patient Instructions (Addendum)
Seth Rodrigue Return in about 6 months (around 10/10/2023).   Chronic cough-suspected mostly upper airway symptoms. -Continue gabapentin as prescribed as well as nortriptyline -Read handout on chronic throat clearing below. -Habit modifications reviewed  Mild Persistent Asthma:  - Maintenance inhaler: decrease Pulmicort Flexhaler 1 puffs twice daily.  During asthma flares/respiratory infections: increase Pulmicort 180 mcg 2 puffs twice daily and continue for 2 weeks. - Rescue inhaler: Xopenex 1-2 puffs every 4-6 hours as needed for respiratory symptoms of cough, shortness of breath, or wheezing Asthma control goals:  Full participation in all desired activities (may need albuterol before activity) Albuterol use two times or less a week on average (not counting use with activity) Cough interfering with sleep two times or less a month Oral steroids no more than once a year No hospitalizations   Chronic Rhinitis -suspected allergic with recurrent sinusitis -call us to schedule skin testing once you feel you can come off your allergy meds for at least 3 days. - Use nasal saline rinses before nose sprays such as with Neilmed Sinus Rinse.  Use distilled water.   - Use Nasacort 2  sprays each nostril daily. Aim upward and outward. - Use Azelastine 1-2 sprays each nostril twice daily. Aim upward and outward. - Use Zyrtec 10 mg daily or Allegra 180mg  daily or Xyzal 5mg  daily.   - Singulair 10mg  daily.   - For eyes, use Olopatadine or Ketotifen 1 eye drop daily as needed for itchy, watery eyes.  Available over the counter, if not covered by insurance.  - Consider retesting in the future. Hold all anti-histamines (Zyrtec/Azelastine/Olopatadine/Alaway-Ketotifen) 3 days prior, if interested.    Reflux:  - continue GI follow-up and rabeprazole daily.  Recurrent sinusitis:  - your immune evaluation showed inadequate titers to strep pneumonia vaccine and diptheria - call your primary  doctor to get this vaccines (Pneumovax 23 and diptheria booster) and let us know so that we can repeat your titers 6 weeks later   Follow up : 6 months, sooner if needed It was a pleasure seeing you again in clinic today! Thank you for allowing me to participate in your care.  Tonny Bollman, MD [facility]  CHRONIC THROAT CLEARING-WHAT TO DO!! Causes of chronic throat clearing may include the following (among others):  Acid reflux (lanyngopharyngeal reflux) Allergies (pollens, pet dander, dust mites, etc) Non allergic rhinitis (runny nose, post nasal drainage or congestion NOT caused by allergies-can be secondary to pollutants, cold air, changes in blood vessels to the nose as we age,etc) Environmental irritants (tobacco, smoke, air pollution) Asthma If present for a long period of time, throat clearing can become a habit. We will work with you to treat any of the medical reasons for throat clearing.  Your job is to help prevent the habit, which can cause damage (redness and swelling) to your vocal cords.  It will require a conscious effort on your behalf.  Tips for prevention of throat clearing:  Instead of clearing your throat, swallow instead.   Carrying around water (or something to drink) will help you move the mucus in the right direction.  IF you have the urge to clear your throat, drink your water. If you absolutely have to clear your throat, use a non-traumatic exercise to do so.   Pant with your mouth open saying "[place], [place], [place]" with a powerful, breathy voice. Increase water intake.  This thins secretions, making them easier to swallow. Chew baking soda gum (ARM &  HAMMER) which can help with swallowing, reflux, and throat clearing.  Try to chew up to three times daily.   If you experience jaw pain or headaches, decrease the amount of chewing. Suck on sugar free hard candy to help with swallowing. Have your friends and family remind you to swallow when they hear  you throat clearing.  As this can be habit forming, sometimes you may not realize you are doing this.  Having someone point it out to you, will help you become more conscious of the behavior. BE PATIENT.  This will take time to resolve, and some do not see improvement until 8-12 weeks into therapy/behavior modifications.

## 2023-04-26 ENCOUNTER — Other Ambulatory Visit: Payer: Self-pay | Admitting: Family Medicine

## 2023-04-26 ENCOUNTER — Ambulatory Visit: Payer: BC Managed Care – PPO | Admitting: Family Medicine

## 2023-04-26 ENCOUNTER — Encounter: Payer: Self-pay | Admitting: Family Medicine

## 2023-04-26 ENCOUNTER — Ambulatory Visit: Payer: BC Managed Care – PPO

## 2023-04-26 VITALS — BP 131/84 | HR 88 | Ht 60.0 in | Wt 158.8 lb

## 2023-04-26 DIAGNOSIS — M25521 Pain in right elbow: Secondary | ICD-10-CM

## 2023-04-26 DIAGNOSIS — R21 Rash and other nonspecific skin eruption: Secondary | ICD-10-CM | POA: Diagnosis not present

## 2023-04-26 MED ORDER — TRIAMCINOLONE ACETONIDE 0.5 % EX CREA: 1.0000 | TOPICAL_CREAM | Freq: Two times a day (BID) | CUTANEOUS | 0 refills | Status: DC

## 2023-04-26 NOTE — Progress Notes (Signed)
Acute Office Visit  Subjective:     Patient ID: Tara Howard, female    DOB: Dec 24, 1970, 52 y.o.   MRN: [id number]  Chief Complaint  Patient presents with   Elbow Injury    HPI Patient is in today for elbow pain that has been persistent for 7 weeks. She had a fall and broke her foot and at the time did not register the pain in her elbow but has begun to notice pain any time she brushes her right elbow up against something.   Review of Systems  Constitutional:  Negative for chills and fever.  Respiratory:  Negative for cough and shortness of breath.   Cardiovascular:  Negative for chest pain.  Musculoskeletal:        Right elbow pain  Neurological:  Negative for headaches.        Objective:    BP 131/84 (BP Location: Left Arm, Patient Position: Sitting, Cuff Size: Normal)   Pulse 88   Ht 5' (1.524 m)   Wt 158 lb 12 oz (72 kg)   SpO2 97%   BMI 31.00 kg/m    Physical Exam Vitals and nursing note reviewed.  Constitutional:      General: She is not in acute distress.    Appearance: Normal appearance.  HENT:     Head: Normocephalic and atraumatic.     Right Ear: External ear normal.     Left Ear: External ear normal.     Nose: Nose normal.  Eyes:     Conjunctiva/sclera: Conjunctivae normal.  Cardiovascular:     Rate and Rhythm: Normal rate and regular rhythm.  Pulmonary:     Effort: Pulmonary effort is normal.     Breath sounds: Normal breath sounds.  Musculoskeletal:     Comments: Good ROM Non-tender to olecranon  Neurological:     General: No focal deficit present.     Mental Status: She is alert and oriented to person, place, and time.  Psychiatric:        Mood and Affect: Mood normal.        Behavior: Behavior normal.        Thought Content: Thought content normal.        Judgment: Judgment normal.     No results found for any visits on 04/26/23.      Assessment & Plan:   Problem List Items Addressed This Visit       Musculoskeletal and  Integument   Rash    - on chin will go ahead and treat with triamcinolone cream        Other   Right elbow pain - Primary    - pt has pain to R elbow after sustaining a fall where she has broken her foot.  - ROM was normal on exam, no skin changes - will go ahead and obtain xrays       Meds ordered this encounter  Medications   triamcinolone cream (KENALOG) 0.5 %    Sig: Apply 1 Application topically 2 (two) times daily. To affected areas.    Dispense:  30 g    Refill:  0    No follow-ups on file.  Charlton Amor, DO

## 2023-04-26 NOTE — Assessment & Plan Note (Signed)
-   on chin will go ahead and treat with triamcinolone cream

## 2023-04-26 NOTE — Assessment & Plan Note (Addendum)
-   pt has pain to R elbow after sustaining a fall where she has broken her foot.  - ROM was normal on exam, no skin changes - will go ahead and obtain xrays

## 2023-05-05 ENCOUNTER — Telehealth: Payer: Self-pay | Admitting: Family Medicine

## 2023-05-05 MED ORDER — GABAPENTIN 300 MG PO CAPS: 300.0000 mg | ORAL_CAPSULE | Freq: Every day | ORAL | 1 refills | Status: DC

## 2023-05-05 NOTE — Telephone Encounter (Signed)
Patient is requesting refill on gabapentin (NEURONTIN) 300 MG capsule [[id number]]  Pharmacy  [facility] on [address]  [place]  Phone number [phone]

## 2023-05-05 NOTE — Telephone Encounter (Signed)
Medication sent to pharmacy  

## 2023-05-25 ENCOUNTER — Telehealth: Payer: Self-pay | Admitting: Family Medicine

## 2023-05-25 NOTE — Telephone Encounter (Signed)
Patient called she is asking if she needs to get a prescription for Diflucan to take along with  Nystatiin  100000 for her oral thrust   Pharmacy [facility] in [place] [phone]

## 2023-05-25 NOTE — Telephone Encounter (Signed)
Pt was advised that she doesn't need diflucan. She also asked if she could brush/wash out her mouth afterwards she was advised NOT to do this. She voiced understanding and agreed.

## 2023-05-25 NOTE — Telephone Encounter (Signed)
Doesn't need the diflucan.  Pt notified.

## 2023-06-03 ENCOUNTER — Other Ambulatory Visit: Payer: Self-pay | Admitting: Gastroenterology

## 2023-06-03 ENCOUNTER — Other Ambulatory Visit: Payer: Self-pay | Admitting: Family Medicine

## 2023-06-03 DIAGNOSIS — F5101 Primary insomnia: Secondary | ICD-10-CM

## 2023-06-13 ENCOUNTER — Encounter: Payer: Self-pay | Admitting: Family Medicine

## 2023-06-13 ENCOUNTER — Ambulatory Visit (INDEPENDENT_AMBULATORY_CARE_PROVIDER_SITE_OTHER): Payer: BC Managed Care – PPO | Admitting: Family Medicine

## 2023-06-13 VITALS — BP 124/82 | HR 102 | Temp 99.9°F

## 2023-06-13 DIAGNOSIS — R053 Chronic cough: Secondary | ICD-10-CM | POA: Diagnosis not present

## 2023-06-13 DIAGNOSIS — J22 Unspecified acute lower respiratory infection: Secondary | ICD-10-CM | POA: Diagnosis not present

## 2023-06-13 LAB — POC COVID19 BINAXNOW: SARS Coronavirus 2 Ag: NEGATIVE

## 2023-06-13 MED ORDER — GUAIFENESIN-CODEINE 100-10 MG/5ML PO SOLN
5.0000 mL | Freq: Three times a day (TID) | ORAL | 0 refills | Status: DC | PRN
Start: 2023-06-13 — End: 2023-07-10

## 2023-06-13 MED ORDER — CEFDINIR 300 MG PO CAPS: 300.0000 mg | ORAL_CAPSULE | Freq: Two times a day (BID) | ORAL | 0 refills | Status: DC

## 2023-06-13 NOTE — Progress Notes (Signed)
Acute Office Visit  Subjective:     Patient ID: Tara Howard, female    DOB: 04/15/1971, 52 y.o.   MRN: [id number]  Chief Complaint  Patient presents with   Cough    x3weeks    HPI Patient is in today for cough that is chronic but worse the last f-2 weeks.  He did start getting a runny nose and sore throat over the weekend and was around her daughter who had a cold symptoms.  Cough is occ productive with yellow mucous. Using delsym.  No fever, sweats or chills.  No GI symptoms, except for maybe a little increased reflux yesterday which she thinks was secondary to the cold and cough medicine.  Some snapping in the ears.  She last saw the allergist in September.  At that time they gave her 40 mg that IM Depo-Medrol and prednisone oral burst Bactrim and added Pulmicort.  Does have a follow-up appointment with them next month.  She also just had foot surgery about 4 weeks ago so still nonweightbearing on that foot.  ROS      Objective:    BP 124/82   Pulse (!) 102   Temp 99.9 F (37.7 C)   SpO2 99%    Physical Exam Constitutional:      Appearance: Normal appearance.  HENT:     Head: Normocephalic and atraumatic.     Right Ear: Tympanic membrane, ear canal and external ear normal. There is no impacted cerumen.     Left Ear: Tympanic membrane, ear canal and external ear normal. There is no impacted cerumen.     Nose: Nose normal.     Mouth/Throat:     Pharynx: Oropharynx is clear.  Eyes:     Conjunctiva/sclera: Conjunctivae normal.  Cardiovascular:     Rate and Rhythm: Normal rate and regular rhythm.  Pulmonary:     Effort: Pulmonary effort is normal.     Breath sounds: Normal breath sounds.  Musculoskeletal:     Cervical back: Neck supple. No tenderness.  Lymphadenopathy:     Cervical: No cervical adenopathy.  Skin:    General: Skin is warm and dry.  Neurological:     Mental Status: She is alert and oriented to person, place, and time.  Psychiatric:         Mood and Affect: Mood normal.     Results for orders placed or performed in visit on 06/13/23  POC COVID-19  Result Value Ref Range   SARS Coronavirus 2 Ag Negative Negative        Assessment & Plan:   Problem List Items Addressed This Visit   None Visit Diagnoses     Chronic cough    -  Primary   Relevant Orders   POC COVID-19 (Completed)   Lower resp. tract infection       Relevant Medications   cefdinir (OMNICEF) 300 MG capsule      Acute on top of chronic cough-suspect that she may have initially picked up a viral illness but it has been going on for a couple of weeks at this point.  Her daughter was recently sick so we did go ahead and swab for COVID.  No specific flulike symptoms so we did not test for that today no fever or bodyaches.  Negative for COVID.  I am going to go ahead and place her on Omnicef.  Will hold off on prednisone and less of the next few days she feels like she is not  improving or getting worse.  She is seeing the allergist on Thursday as well.  Cough syrup provided.  Note provided for today and tomorrow can extend if needed.  Meds ordered this encounter  Medications   guaiFENesin-codeine 100-10 MG/5ML syrup    Sig: Take 5 mLs by mouth 3 (three) times daily as needed for cough.    Dispense:  118 mL    Refill:  0   cefdinir (OMNICEF) 300 MG capsule    Sig: Take 1 capsule (300 mg total) by mouth 2 (two) times daily.    Dispense:  14 capsule    Refill:  0    No follow-ups on file.  Nani Gasser, MD

## 2023-06-15 ENCOUNTER — Encounter: Payer: Self-pay | Admitting: Internal Medicine

## 2023-06-15 ENCOUNTER — Ambulatory Visit: Payer: BC Managed Care – PPO | Admitting: Internal Medicine

## 2023-06-15 VITALS — BP 138/84 | HR 97 | Temp 97.2°F | Resp 18

## 2023-06-15 DIAGNOSIS — K219 Gastro-esophageal reflux disease without esophagitis: Secondary | ICD-10-CM

## 2023-06-15 DIAGNOSIS — J329 Chronic sinusitis, unspecified: Secondary | ICD-10-CM | POA: Diagnosis not present

## 2023-06-15 DIAGNOSIS — R058 Other specified cough: Secondary | ICD-10-CM

## 2023-06-15 DIAGNOSIS — J4520 Mild intermittent asthma, uncomplicated: Secondary | ICD-10-CM

## 2023-06-15 MED ORDER — LEVALBUTEROL TARTRATE 45 MCG/ACT IN AERO: 1.0000 | INHALATION_SPRAY | Freq: Four times a day (QID) | RESPIRATORY_TRACT | 1 refills | Status: DC | PRN

## 2023-06-15 NOTE — Patient Instructions (Addendum)
Follow up : November 27th at 10:30 AM (1-55).   Must be off antibiotic, feeling well and no vaccines within 48 hours of testing. Must stop antihistamines 3 days prior to testing  It was a pleasure seeing you again in clinic today! Thank you for allowing me to participate in your care.  Tonny Bollman, MD [facility]   Chronic cough-suspected mostly upper airway symptoms. -Continue gabapentin as prescribed as well as nortriptyline -Read handout on chronic throat clearing below. - use nasal sprays as below -Habit modifications reviewed  Mild Persistent Asthma:  - Rescue inhaler: Xopenex 1-2 puffs every 4-6 hours as needed for respiratory symptoms of cough, shortness of breath, or wheezing Asthma control goals:  Full participation in all desired activities (may need albuterol before activity) Albuterol use two times or less a week on average (not counting use with activity) Cough interfering with sleep two times or less a month Oral steroids no more than once a year No hospitalizations  Chronic Rhinitis -suspected allergic with recurrent sinusitis -skin testing as above next week if feeling well - Use nasal saline rinses before nose sprays such as with Neilmed Sinus Rinse.  Use distilled water.   - Use Nasacort 2  sprays each nostril daily. Aim upward and outward. - Use Azelastine 1-2 sprays each nostril twice daily. Aim upward and outward. - Use Zyrtec 10 mg daily or Allegra 180mg  daily or Xyzal 5mg  daily.   - Singulair 10mg  daily.   - For eyes, use Olopatadine or Ketotifen 1 eye drop daily as needed for itchy, watery eyes.  Available over the counter, if not covered by insurance.   Reflux:  - continue GI follow-up and rabeprazole daily.  Recurrent sinusitis:  - your immune evaluation showed inadequate titers to strep pneumonia vaccine and diptheria - call your primary doctor to get this vaccines (Pneumovax 23 and diptheria booster) and let us know so that we  can repeat your titers 6 weeks later   Follow up : November 27 at 10:30 AM. It was a pleasure seeing you again in clinic today! Thank you for allowing me to participate in your care.  Tonny Bollman, MD [facility]  CHRONIC THROAT CLEARING-WHAT TO DO!! Causes of chronic throat clearing may include the following (among others):  Acid reflux (lanyngopharyngeal reflux) Allergies (pollens, pet dander, dust mites, etc) Non allergic rhinitis (runny nose, post nasal drainage or congestion NOT caused by allergies-can be secondary to pollutants, cold air, changes in blood vessels to the nose as we age,etc) Environmental irritants (tobacco, smoke, air pollution) Asthma If present for a long period of time, throat clearing can become a habit. We will work with you to treat any of the medical reasons for throat clearing.  Your job is to help prevent the habit, which can cause damage (redness and swelling) to your vocal cords.  It will require a conscious effort on your behalf.  Tips for prevention of throat clearing:  Instead of clearing your throat, swallow instead.   Carrying around water (or something to drink) will help you move the mucus in the right direction.  IF you have the urge to clear your throat, drink your water. If you absolutely have to clear your throat, use a non-traumatic exercise to do so.   Pant with your mouth open saying "[place], [place]" with a powerful, breathy voice. Increase water intake.  This thins secretions, making them easier to swallow. Chew baking soda gum (  ARM & HAMMER) which can help with swallowing, reflux, and throat clearing.  Try to chew up to three times daily.   If you experience jaw pain or headaches, decrease the amount of chewing. Suck on sugar free hard candy to help with swallowing. Have your friends and family remind you to swallow when they hear you throat clearing.  As this can be habit forming, sometimes you may not realize you are  doing this.  Having someone point it out to you, will help you become more conscious of the behavior. BE PATIENT.  This will take time to resolve, and some do not see improvement until 8-12 weeks into therapy/behavior modifications.

## 2023-06-15 NOTE — Progress Notes (Signed)
FOLLOW UP Date of Service/Encounter:  06/15/23  Subjective:  Tara Howard (DOB: 1971-01-16) is a 52 y.o. female who [facility] in re-evaluation of the following: Chronic rhinitis, chronic cough, recurrent sinusitis, asthma  History obtained from: chart review and patient.  For Review, LV was on 04/12/23  with Dr.Tara Rud seen for routine follow-up. See below for summary of history and diagnostics.   Therapeutic plans/changes recommended: FEV1 105% at last visit ----------------------------------------------------- Pertinent History/Diagnostics:  Chronic cough-upper airway + some suspected mild asthma: Diagnosed in mid teens, worsened over the last several years.  6 courses OCS in the past year.  No previous hospitalizations. Identified triggers: Exercise, respiratory illness, allergies. Meds tried: Breo/Trelegy without relief, Qvar worked well but stopped due to insurance.  Has also tried Symbicort and Advair, but did not find them effective. Current meds: Pulmicort,Also on gabapentin prescribed by outside physicians. Has reflux on Rabeprazole.Also on nortriptyline for esophageal sensitivity  -Normal spirometry (11/16/2022): ratio 84%, 93% FEV1 - labs environmental panel 03/15/23: negative Chronic rhinitis: with recurrent sinusitis. Started in childhood, worse in the last year.  Symptoms include nasal congestion, rhinorrhea, postnasal drip, sneezing, watery eyes, itchy eyes.  Failed multiple courses of antibiotic/prednisone in the last year.  Diagnosed fall winter with sinusitis.  Has mold in her home. No prior sinus surgeries Has seen ENT, and treated with 3 weeks of doxycycline for chronic sinusitis in spring 2024.  No scheduled follow-up. - immune evaluation via labs 03/15/23: adequate response to tetanus, diptheria not protected, strep not adequate coverage, normal IgG, IgM, IgA. Other: fibromyalgia, IBS, GERD, migraines, anxiety and  depression. 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 --------------------------------------------------- Today presents for follow-up. Discussed the use of AI scribe software for clinical note transcription with the patient, who gave verbal consent to proceed.  History of Present Illness   The patient, with a history of chronic cough and asthma, reports discontinuing Pulmicort with no subsequent adverse effects. However, she has recently been diagnosed with bronchitis again and has been using a rescue inhaler approximately once a day. The inhaler is reported to be more effective when used in conjunction with Mucinex. Xopenex alone dose not seem to help much. The patient continues to take gabapentin and nortriptyline, and uses Nasacort for nasal symptoms. She denies significant nasal drainage as long as she maintains her nasal rinse regimen.  The patient also reports a persistent cough, which has worsened with the recent bronchitis diagnosis. She has been trying to control throat clearing, but finds it difficult due to a sensation of something in her throat. She has an upcoming appointment with her GI doctor. She continues on her PPI. The patient has been off her antihistamines for the past four days in preparation for allergy testing, which has been rescheduled due to her current antibiotic treatment for bronchitis. She requests a refill for Xopenex due to the persistent and worsening cough.   She has still not obtain her Pneumovax 23 vaccine or diphtheria booster.     Chart Review: Seen by PCP on 11/18 and prescribed omnicef for bronchitis.  All medications reviewed by clinical staff and updated in chart. No new pertinent medical or surgical history except as noted in HPI.  ROS: All others negative except as noted per HPI.   Objective:  BP 138/84   Pulse 97   Temp (!) 97.2 F (36.2 C) (Temporal)   Resp 18   SpO2 98%  There is no height or weight  on file to calculate  BMI. Physical Exam: General Appearance:  Alert, cooperative, no distress, appears stated age  Head:  Normocephalic, without obvious abnormality, atraumatic  Eyes:  Conjunctiva clear, EOM's intact  Ears EACs normal bilaterally and normal TMs bilaterally  Nose: Nares normal, hypertrophic turbinates, normal mucosa, and no visible anterior polyps  Throat: Lips, tongue normal; teeth and gums normal, normal posterior oropharynx and + cobblestoning  Neck: Supple, symmetrical  Lungs:   clear to auscultation bilaterally, Respirations unlabored, intermittent dry coughing  Heart:  regular rate and rhythm and no murmur, Appears well perfused  Extremities: No edema  Skin: Skin color, texture, turgor normal and no rashes or lesions on visualized portions of skin  Neurologic: No gross deficits   Labs:  Lab Orders  No laboratory test(s) ordered today   Assessment/Plan   Follow up : November 27th at 10:30 AM (1-55).   Must be off antibiotic, feeling well and no vaccines within 48 hours of testing. Must stop antihistamines 3 days prior to testing  It was a pleasure seeing you again in clinic today! Thank you for allowing me to participate in your care.  Tonny Bollman, MD [facility]   Chronic cough-suspected mostly upper airway symptoms. Persistent despite discontinuation of Pulmicort. Current episode of bronchitis being treated with antibiotics. Rescue inhaler used daily with limited relief. Noted throat clearing and sensation of something in throat. -Continue gabapentin as prescribed as well as nortriptyline -Read handout on chronic throat clearing below. - use nasal sprays as below -Habit modifications reviewed  Mild Persistent Asthma: at goal - Rescue inhaler: Xopenex 1-2 puffs every 4-6 hours as needed for respiratory symptoms of cough, shortness of breath, or wheezing Asthma control goals:  Full participation in all desired activities (may need albuterol before  activity) Albuterol use two times or less a week on average (not counting use with activity) Cough interfering with sleep two times or less a month Oral steroids no more than once a year No hospitalizations  Chronic Rhinitis -suspected allergic with recurrent sinusitis -skin testing as above next week if feeling well - Use nasal saline rinses before nose sprays such as with Neilmed Sinus Rinse.  Use distilled water.   - Use Nasacort 2  sprays each nostril daily. Aim upward and outward. - Use Azelastine 1-2 sprays each nostril twice daily. Aim upward and outward. - Use Zyrtec 10 mg daily or Allegra 180mg  daily or Xyzal 5mg  daily.   - Singulair 10mg  daily.   - For eyes, use Olopatadine or Ketotifen 1 eye drop daily as needed for itchy, watery eyes.  Available over the counter, if not covered by insurance.   Reflux: suspected not controlled - continue GI follow-up and rabeprazole daily.  Recurrent sinusitis:  - your immune evaluation showed inadequate titers to strep pneumonia vaccine and diptheria - call your primary doctor to get this vaccines (Pneumovax 23 and diptheria booster) and let us know so that we can repeat your titers 6 weeks later   Follow up : November 27 at 10:30 AM. It was a pleasure seeing you again in clinic today! Thank you for allowing me to participate in your care.  Other: none  Tonny Bollman, MD  [facility]

## 2023-06-21 ENCOUNTER — Ambulatory Visit (INDEPENDENT_AMBULATORY_CARE_PROVIDER_SITE_OTHER): Payer: BC Managed Care – PPO | Admitting: Internal Medicine

## 2023-06-21 ENCOUNTER — Encounter: Payer: Self-pay | Admitting: Internal Medicine

## 2023-06-21 DIAGNOSIS — J310 Chronic rhinitis: Secondary | ICD-10-CM | POA: Diagnosis not present

## 2023-06-21 MED ORDER — FLUCONAZOLE 150 MG PO TABS: 150.0000 mg | ORAL_TABLET | Freq: Every day | ORAL | 0 refills | Status: AC

## 2023-06-21 NOTE — Progress Notes (Signed)
Date of Service/Encounter:  06/21/23  Allergy testing appointment   Previous visit on 06/15/23, seen for upper airway cough, chronic rhinitis, reflux, recurrent sinusitis, mild persistent asthma.  Please see that note for additional details.  Today reports for allergy diagnostic testing:    DIAGNOSTICS:  Skin Testing: Environmental allergy panel. Adequate positive and negative controls. Results discussed with patient/family.  Airborne Adult Perc - 06/21/23 1033     Time Antigen Placed 1030    Allergen Manufacturer Waynette Buttery    Location Back    Number of Test 55    1. Control-Buffer 50% Glycerol Negative    2. Control-Histamine 3+    3. Bahia Negative    4. French Southern Territories Negative    5. Johnson Negative    6. Kentucky Blue Negative    7. Meadow Fescue Negative    8. Perennial Rye Negative    9. Timothy Negative    10. Ragweed Mix Negative    11. Cocklebur Negative    12. Plantain,  English Negative    13. Baccharis Negative    14. Dog Fennel Negative    15. Russian Thistle Negative    16. Lamb's Quarters Negative    17. Sheep Sorrell Negative    18. Rough Pigweed Negative    19. Marsh Elder, Rough Negative    20. Mugwort, Common Negative    21. Box, Elder Negative    22. Cedar, red Negative    23. Sweet Gum Negative    24. Pecan Pollen Negative    25. Pine Mix Negative    26. Walnut, Black Pollen Negative    27. Red Mulberry Negative    28. Ash Mix Negative    29. Birch Mix Negative    30. Beech American Negative    31. Cottonwood, Guinea-Bissau Negative    32. Hickory, White Negative    33. Maple Mix Negative    34. Oak, [place] Mix Negative    35. Sycamore Eastern Negative    36. Alternaria Alternata Negative    37. Cladosporium Herbarum Negative    38. Aspergillus Mix Negative    39. Penicillium Mix Negative    40. Bipolaris Sorokiniana (Helminthosporium) Negative    41. Drechslera Spicifera (Curvularia) Negative    42. Mucor Plumbeus Negative    43. Fusarium  Moniliforme Negative    44. Aureobasidium Pullulans (pullulara) Negative    45. Rhizopus Oryzae Negative    46. Botrytis Cinera Negative    47. Epicoccum Nigrum Negative    48. Phoma Betae Negative    49. Dust Mite Mix Negative    50. Cat Hair 10,000 BAU/ml Negative    51.  Dog Epithelia Negative    52. Mixed Feathers Negative    53. Horse Epithelia Negative    54. Cockroach, German Negative    55. Tobacco Leaf Negative             Intradermal - 06/21/23 1055     Time Antigen Placed 1100    Location Arm    Number of Test 16    Control 3+    Bahia Negative    French Southern Territories Negative    Johnson Negative    7 Grass Negative    Ragweed Mix Negative    Weed Mix Negative    Tree Mix Negative    Mold 1 2+    Mold 2 2+    Mold 3 Negative    Mold 4 Negative    Mite Mix Negative    Cat  Negative    Dog Negative    Cockroach Negative             Allergy testing results were read and interpreted by myself, documented by clinical staff.  Patient provided with copy of allergy testing along with avoidance measures when indicated.   Tonny Bollman, MD  [facility]

## 2023-06-21 NOTE — Patient Instructions (Addendum)
Chronic cough-suspected mostly upper airway symptoms. -Continue gabapentin as prescribed as well as nortriptyline -Read handout on chronic throat clearing below. - use nasal sprays as below -Habit modifications reviewed  Mild Persistent Asthma:  - Rescue inhaler: Xopenex 1-2 puffs every 4-6 hours as needed for respiratory symptoms of cough, shortness of breath, or wheezing Asthma control goals:  Full participation in all desired activities (may need albuterol before activity) Albuterol use two times or less a week on average (not counting use with activity) Cough interfering with sleep two times or less a month Oral steroids no more than once a year No hospitalizations  Chronic Rhinitis - Nonallergic -skin testing today was negative, intradermal testing was borderline to indoor and outdoor mold (however not itchy, suspected irritant reaction), previous labs negative. - Use nasal saline rinses before nose sprays such as with Neilmed Sinus Rinse.  Use distilled water.   - Use Nasacort 2  sprays each nostril daily. Aim upward and outward. - Use Azelastine 1-2 sprays each nostril twice daily. Aim upward and outward. - Use Zyrtec 10 mg daily or Allegra 180mg  daily or Xyzal 5mg  daily.   - Singulair 10mg  daily.   - For eyes, use Olopatadine or Ketotifen 1 eye drop daily as needed for itchy, watery eyes.  Available over the counter, if not covered by insurance.  Follow-up with ENT (referral already placed)  Reflux:  - continue GI follow-up and rabeprazole daily.  Recurrent sinusitis:  - your immune evaluation showed inadequate titers to strep pneumonia vaccine and diptheria - call your primary doctor to get this vaccines (Pneumovax 23 and diptheria booster) and let us know so that we can repeat your titers 6 weeks later  Oral Thrush:  Diflucan 150 mg daily for 14 days.   Follow up : 6 months, sooner if needed It was a pleasure seeing you again in clinic today! Thank you for allowing me  to participate in your care.  Tonny Bollman, MD [facility]  CHRONIC THROAT CLEARING-WHAT TO DO!! Causes of chronic throat clearing may include the following (among others):  Acid reflux (lanyngopharyngeal reflux) Allergies (pollens, pet dander, dust mites, etc) Non allergic rhinitis (runny nose, post nasal drainage or congestion NOT caused by allergies-can be secondary to pollutants, cold air, changes in blood vessels to the nose as we age,etc) Environmental irritants (tobacco, smoke, air pollution) Asthma If present for a long period of time, throat clearing can become a habit. We will work with you to treat any of the medical reasons for throat clearing.  Your job is to help prevent the habit, which can cause damage (redness and swelling) to your vocal cords.  It will require a conscious effort on your behalf.  Tips for prevention of throat clearing:  Instead of clearing your throat, swallow instead.   Carrying around water (or something to drink) will help you move the mucus in the right direction.  IF you have the urge to clear your throat, drink your water. If you absolutely have to clear your throat, use a non-traumatic exercise to do so.   Pant with your mouth open saying "[place], [place], [place]" with a powerful, breathy voice. Increase water intake.  This thins secretions, making them easier to swallow. Chew baking soda gum (ARM & HAMMER) which can help with swallowing, reflux, and throat clearing.  Try to chew up to three times daily.   If you experience jaw pain or headaches, decrease the amount of chewing. Suck on sugar  free hard candy to help with swallowing. Have your friends and family remind you to swallow when they hear you throat clearing.  As this can be habit forming, sometimes you may not realize you are doing this.  Having someone point it out to you, will help you become more conscious of the behavior. BE PATIENT.  This will take time to resolve, and some do  not see improvement until 8-12 weeks into therapy/behavior modifications.

## 2023-06-26 ENCOUNTER — Telehealth: Payer: Self-pay | Admitting: Internal Medicine

## 2023-06-26 NOTE — Telephone Encounter (Signed)
Pt informed that it is not uncommon to see lingering swelling from mold intradermals.

## 2023-06-26 NOTE — Telephone Encounter (Signed)
Patient called and stated she had an allergy test  last week and she reacted over the weekend to it with spots and swollen on her left arm and has now gone away and wanted to know what she should do next and requested a call back [phone].

## 2023-07-09 ENCOUNTER — Other Ambulatory Visit: Payer: Self-pay | Admitting: Gastroenterology

## 2023-07-10 ENCOUNTER — Ambulatory Visit (INDEPENDENT_AMBULATORY_CARE_PROVIDER_SITE_OTHER): Payer: BC Managed Care – PPO | Admitting: Gastroenterology

## 2023-07-10 ENCOUNTER — Encounter: Payer: Self-pay | Admitting: Gastroenterology

## 2023-07-10 ENCOUNTER — Other Ambulatory Visit (HOSPITAL_COMMUNITY): Payer: Self-pay | Admitting: Gastroenterology

## 2023-07-10 VITALS — BP 120/80 | HR 103 | Ht 60.0 in | Wt 162.5 lb

## 2023-07-10 DIAGNOSIS — K581 Irritable bowel syndrome with constipation: Secondary | ICD-10-CM

## 2023-07-10 DIAGNOSIS — R1310 Dysphagia, unspecified: Secondary | ICD-10-CM

## 2023-07-10 DIAGNOSIS — T17308A Unspecified foreign body in larynx causing other injury, initial encounter: Secondary | ICD-10-CM

## 2023-07-10 DIAGNOSIS — R053 Chronic cough: Secondary | ICD-10-CM

## 2023-07-10 DIAGNOSIS — R059 Cough, unspecified: Secondary | ICD-10-CM | POA: Diagnosis not present

## 2023-07-10 DIAGNOSIS — K219 Gastro-esophageal reflux disease without esophagitis: Secondary | ICD-10-CM

## 2023-07-10 DIAGNOSIS — R0989 Other specified symptoms and signs involving the circulatory and respiratory systems: Secondary | ICD-10-CM | POA: Diagnosis not present

## 2023-07-10 NOTE — Progress Notes (Signed)
Chief Complaint: Coughing Primary GI MD:Dr. Barron Alvine  HPI: 52 year old female history of breast cancer (diagnosed 2020 now s/p bilateral mastectomy, on tamoxifen until 2025), IBS-C, heartburn, presents for evaluation of choking/coughing.  Last seen January 2024 by Dr. Barron Alvine At that time she was continuing to struggle with IBS-C characterized by intermittent generalized abdominal cramping and bloating.  Tried Linzess and stopped due to diarrhea and abdominal pain after a few days.  Trialed Amitiza but it was not effective.  Trulance initially worked but she then reverted back to constipation.  Since marker study with retained stool but no retained markers 02/2019.  Had good response with MiraLAX with increasing symptoms November 2023.  Responded to bowel purge and then started on Amitiza 24 mcg twice daily with improvement. States she has also tried motegrity without improvement.  She has a longstanding history of heartburn and regurgitation treated with Nexium for years but then she began having breakthrough symptoms.  Was started on Dexilant which was initially effective but then lost efficacy.  EGD was normal February 2020.  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  No improvement on FD guard.  No change with high-dose Protonix.  Started on nortriptyline and titrated up to 50 Mg nightly with good response  ---------------TODAY-------------------- Patient states over the last 6 months she has been struggling with choking.  This occurs mainly on her own saliva and with liquids.  She often will try to swallow liquids when she has intense choking.  She states that she constantly feels like there is something in her throat and she has dysphagia with liquids which produces choking sensation.  No issues with solids.  She also reports ongoing cough over the last year.  She states her allergist was overtreating her asthma and with no improvement he told her her cough was  likely from her acid reflux.  She states she often coughs throughout the day, not necessarily associated with eating.  She does note her cough is worse at night.  This cough began after an episode of bronchitis last year.  She states her nortriptyline is doing well with her previous GERD symptoms as she no longer has pain.  She is unsure if this helped her cough.  She states about 2 months ago she had surgery to replace cartilage on her ankle and her surgeon provided her with a stimulant laxative.  She was previously only on MiraLAX but after being on Senokot she has noticed substantial improvement in her bowel movements.  This is worrisome for her as she does not want to rely on a stimulant for having it become dependent for her.    PREVIOUS GI WORKUP   - EGD (09/2010): Erythema in antrum compatible with gastritis. Otherwise normal. No actual report in EMR for review.  - EGD (08/2018, Dr. Barron Alvine): Normal -Colonoscopy (08/2018, Dr. Barron Alvine): 2 small benign polyps in transverse colon, suboptimal prep.  Biopsies negative for [facility] -EGD with Bravo (03/2019, Dr. Barron Alvine): Gastric inlet patch, regular Z-line, normal stomach/duodenum.  Hill grade 2.  Bravo attached but failed communication with recorder -EGD with Bravo (06/2019, Dr. Barron Alvine): Gastric inlet patch, regular Z-line, normal stomach/duodenum.  Hill grade 2.  Bravo placed -Bravo (06/2019): Normal, [place] 3.4.  Good symptom correlation for heartburn with reflux.  Normal number of reflux events.  Consistent with esophageal hypersensitivity.  Past Medical History:  Diagnosis Date   Allergy    allergist in [place]   Anxiety    Asthma    Breast cancer (  HCC)    Chronic headache    Depression    Fibromyalgia    Fibromyalgia 2006   pain / headaches   GERD (gastroesophageal reflux disease)    IBS (irritable bowel syndrome)    Osteoarthritis    in her hips   Vaginismus     Past Surgical History:  Procedure Laterality Date   ANKLE  SURGERY Right    bilateral mastectomy  11/01/2018   BRAVO PH STUDY N/A 03/26/2019   Procedure: BRAVO PH STUDY;  Surgeon: Shellia Cleverly, DO;  Location: [facility];  Service: Gastroenterology;  Laterality: N/A;   BRAVO PH STUDY N/A 06/27/2019   Procedure: BRAVO PH STUDY;  Surgeon: Shellia Cleverly, DO;  Location: [facility];  Service: Gastroenterology;  Laterality: N/A;   BREAST LUMPECTOMY Left 09/2018   BREAST RECONSTRUCTION  12/2018   CESAREAN SECTION     X 2   ENDOMETRIAL ABLATION     ESOPHAGOGASTRODUODENOSCOPY     said it was done maybe about 10 years ago. In [place]   ESOPHAGOGASTRODUODENOSCOPY (EGD) WITH PROPOFOL N/A 03/26/2019   Procedure: ESOPHAGOGASTRODUODENOSCOPY (EGD) WITH PROPOFOL;  Surgeon: Shellia Cleverly, DO;  Location: [facility];  Service: Gastroenterology;  Laterality: N/A;   ESOPHAGOGASTRODUODENOSCOPY (EGD) WITH PROPOFOL N/A 06/27/2019   Procedure: ESOPHAGOGASTRODUODENOSCOPY (EGD) WITH PROPOFOL;  Surgeon: Shellia Cleverly, DO;  Location: [facility];  Service: Gastroenterology;  Laterality: N/A;   TONSILLECTOMY     TONSILLECTOMY     TUBAL LIGATION     UPPER GASTROINTESTINAL ENDOSCOPY      Current Outpatient Medications  Medication Sig Dispense Refill   acetaminophen (TYLENOL) 500 MG tablet Take 500 mg by mouth every 6 (six) hours as needed.     ELDERBERRY PO      gabapentin (NEURONTIN) 300 MG capsule TAKE 1 CAPSULE BY MOUTH THREE TIMES A DAY 90 capsule 1   levalbuterol (XOPENEX HFA) 45 MCG/ACT inhaler Inhale 1-2 puffs into the lungs every 6 (six) hours as needed for wheezing or shortness of breath. 15 g 1   meclizine (ANTIVERT) 25 MG tablet Take 1 tablet (25 mg total) by mouth 3 (three) times daily as needed for dizziness. 30 tablet 1   meloxicam (MOBIC) 15 MG tablet Take 15 mg by mouth daily. Patient hadn't started it yet. Has to pickup rx this afternoon.     montelukast (SINGULAIR) 10 MG tablet Take 1 tablet (10 mg total) by mouth at bedtime. 90  tablet 1   Multiple Vitamins-Minerals (HAIR SKIN AND NAILS FORMULA PO) Take 2 tablets by mouth daily.     nortriptyline ([name]) 50 MG capsule Take 1 capsule (50 mg total) by mouth at bedtime. 90 capsule 3   QULIPTA 60 MG TABS Take 1 tablet by mouth daily.     RABEprazole (ACIPHEX) 20 MG tablet TAKE 1 TABLET BY MOUTH TWICE DAILY(IN THE MORNING AND AT BEDTIME) 180 tablet 3   Spacer/Aero-Holding Chambers (PROCHAMBER [facility]) DEVI See admin instructions.     tamoxifen (NOLVADEX) 20 MG tablet Take 1 tablet by mouth daily.     traZODone (DESYREL) 100 MG tablet TAKE 1 & 1/2 TABLETS (150MG  TOTAL) BY MOUTH AT BEDTIME 135 tablet 0   triamcinolone (NASACORT) 55 MCG/ACT AERO nasal inhaler Place 2 sprays into the nose daily. 1 each 5   venlafaxine (EFFEXOR) 75 MG tablet TAKE 1 TABLET (75 MG TOTAL) BY MOUTH DAILY. NEEDS APPT 90 tablet 1   Current Facility-Administered Medications  Medication Dose Route Frequency Provider Last Rate Last Admin  0.9 %  sodium chloride infusion  500 mL Intravenous Once Cirigliano, Vito V, DO        Allergies as of 07/10/2023 - Review Complete 07/10/2023  Allergen Reaction Noted   Azithromycin Diarrhea, Nausea And Vomiting, and Other (See Comments) 02/11/2016   Zolpidem tartrate Other (See Comments) 09/28/2018   Hycodan [hydrocodone bit-homatrop mbr] Other (See Comments) 07/13/2020   Other Other (See Comments) 06/22/2022   Augmentin [amoxicillin-pot clavulanate] Diarrhea, Nausea And Vomiting, and Other (See Comments) 01/22/2013   Clonidine derivatives Rash 03/20/2019   Flexeril [cyclobenzaprine] Other (See Comments) 11/29/2016   Linzess [linaclotide] Other (See Comments) 11/22/2018   Oxybutynin Rash 03/20/2019    Family History  Problem Relation Age of Onset   Depression Mother    Hypertension Mother    Heart attack Mother    CAD Mother        3 vessel bypass   Irritable bowel syndrome Mother    Aneurysm Mother        brain   CAD Father        3 vessel bypass    Cancer Paternal Grandmother        of the bowel per patient    Bone cancer Paternal Grandmother    Colon cancer Neg Hx    Pancreatic cancer Neg Hx    Liver disease Neg Hx    Stomach cancer Neg Hx    Esophageal cancer Neg Hx     Social History   Socioeconomic History   Marital status: Divorced    Spouse name: Not on file   Number of children: 2   Years of education: Not on file   Highest education level: Not on file  Occupational History   Occupation: Statistician: [employer]    Comment: [employer]  Tobacco Use   Smoking status: Never   Smokeless tobacco: Never  Vaping Use   Vaping status: Never Used  Substance and Sexual Activity   Alcohol use: Not Currently   Drug use: Never   Sexual activity: Not Currently    Birth control/protection: Surgical  Other Topics Concern   Not on file  Social History Narrative   Not on file   Social Drivers of Health   Financial Resource Strain: Medium Risk (05/16/2023)   Received from [facility]   Overall Financial Resource Strain (CARDIA)    Difficulty of Paying Living Expenses: Somewhat hard  Food Insecurity: Food Insecurity Present (05/16/2023)   Received from [facility]   Hunger Vital Sign    Worried About Running Out of Food in the Last Year: Sometimes true    Ran Out of Food in the Last Year: Sometimes true  Transportation Needs: No Transportation Needs (05/16/2023)   Received from [facility] - Transportation    Lack of Transportation (Medical): No    Lack of Transportation (Non-Medical): No  Physical Activity: Insufficiently Active (05/16/2023)   Received from [facility]   Exercise Vital Sign    Days of Exercise per Week: 3 days    Minutes of Exercise per Session: 20 min  Stress: Stress Concern Present (05/16/2023)   Received from [facility] - Occupational Stress Questionnaire    Feeling of Stress : To some extent   Social Connections: Moderately Integrated (05/16/2023)   Received from [facility]   Social Network    How would you rate your social network (family, work, friends)?: Adequate participation with  social networks  Intimate Partner Violence: Not At Risk (05/16/2023)   Received from [facility]   HITS    Over the last 12 months how often did your partner physically hurt you?: Never    Over the last 12 months how often did your partner insult you or talk down to you?: Never    Over the last 12 months how often did your partner threaten you with physical harm?: Never    Over the last 12 months how often did your partner scream or curse at you?: Never    Review of Systems:    Constitutional: No weight loss, fever, chills, weakness or fatigue HEENT: Eyes: No change in vision               Ears, Nose, Throat:  No change in hearing or congestion Skin: No rash or itching Cardiovascular: No chest pain, chest pressure or palpitations   Respiratory: No SOB or cough Gastrointestinal: See HPI and otherwise negative Genitourinary: No dysuria or change in urinary frequency Neurological: No headache, dizziness or syncope Musculoskeletal: No new muscle or joint pain Hematologic: No bleeding or bruising Psychiatric: No history of depression or anxiety    Physical Exam:  Vital signs: BP 120/80 (BP Location: Right Arm, Patient Position: Sitting, Cuff Size: Normal)   Pulse (!) 103   Ht 5' (1.524 m)   Wt 162 lb 8 oz (73.7 kg)   SpO2 99%   BMI 31.74 kg/m   Constitutional: NAD, Well developed, Well nourished, alert and cooperative Head:  Normocephalic and atraumatic. Eyes:   PEERL, EOMI. No icterus. Conjunctiva pink. Respiratory: Respirations even and unlabored. Lungs clear to auscultation bilaterally.   No wheezes, crackles, or rhonchi.  Cardiovascular:  Regular rate and rhythm. No peripheral edema, cyanosis or pallor.  Gastrointestinal:  Soft, nondistended, nontender. No rebound or guarding.  Normal bowel sounds. No appreciable masses or hepatomegaly. Rectal:  Not performed.  Msk:  Symmetrical without gross deformities. Without edema, no deformity or joint abnormality.  Neurologic:  Alert and  oriented x4;  grossly normal neurologically.  Skin:   Dry and intact without significant lesions or rashes. Psychiatric: Oriented to person, place and time. Demonstrates good judgement and reason without abnormal affect or behaviors.   RELEVANT LABS AND IMAGING: CBC    Component Value Date/Time   WBC 4.5 03/15/2023 1145   WBC 5.3 10/09/2014 0923   RBC 4.13 03/15/2023 1145   RBC 4.43 10/09/2014 0923   HGB 12.7 03/15/2023 1145   HCT 38.5 03/15/2023 1145   PLT 222 03/15/2023 1145   MCV 93 03/15/2023 1145   MCH 30.8 03/15/2023 1145   MCH 31.2 10/09/2014 0923   MCHC 33.0 03/15/2023 1145   MCHC 33.1 10/09/2014 0923   RDW 12.7 03/15/2023 1145   LYMPHSABS 2.0 03/15/2023 1145   MONOABS 0.6 09/02/2011 1328   EOSABS 0.1 03/15/2023 1145   BASOSABS 0.0 03/15/2023 1145    CMP     Component Value Date/Time   NA 140 08/08/2016 0000   NA 140 08/08/2016 0000   K 4.3 08/08/2016 0000   K 4.3 08/08/2016 0000   CL 101 10/09/2014 0923   CO2 27 10/09/2014 0923   GLUCOSE 84 10/09/2014 0923   BUN 12 08/08/2016 0000   BUN 18 08/08/2016 0000   CREATININE 0.7 08/08/2016 0000   CREATININE 0.7 08/08/2016 0000   CREATININE 0.81 10/09/2014 0923   CALCIUM 9.2 10/09/2014 0923   PROT 6.4 10/09/2014 0923   ALBUMIN 4.3 10/09/2014 1610  AST 15 08/08/2016 0000   AST 15 08/08/2016 0000   ALT 13 08/08/2016 0000   ALT 13 08/08/2016 0000   ALKPHOS 51 08/08/2016 0000   BILITOT 0.5 10/09/2014 0923   GFRNONAA >89 10/09/2014 0923   GFRAA >89 10/09/2014 0923     Assessment/Plan:   Choking Coughing Dysphagia Dysphagia to liquids which produces choking sensation ongoing for the last 6 months.  Coughing over the last year with no improvement from asthma treatment or GERD tx. began after episode of  bronchitis.  Has not seen pulmonary.  No dysphagia to solids.  Currently concerned patient may be aspirating since her dysphagia is to liquids leading to choking.  Suspect her cough could be from GERD, though with association of choking, occurring randomly throughout the day and night and beginning after an episode of bronchitis this may be more pulmonary in nature secondary to aspiration. - MBS for further evaluation - Pending MBS may consider referral to pulmonology - Continue to optimize acid treatment with nortriptyline - Could consider addition of Carafate if this is acid related, though this would not be ideal as she already struggles with constipation.  Will discuss with Dr. Barron Alvine for further management.  Longstanding history of GERD characterized by heartburn and regurgitation Has failed Nexium, Dexilant, FD guard, Protonix.  Currently on nortriptyline 50 Mg with good response.  Although she is having coughing, this is not necessarily leading and was precipitated by an episode of bronchitis last year.  Suspect may be more pulmonary in nature although could be from her reflux. - Continue nortriptyline - Educated patient on lifestyle modifications and encouraged bed which as her symptoms are nighttime - Pending MBS may consider repeat EGD, last EGD was in 2020  IBS-C Has tried and failed Linzess, Amitiza (both doses), Trulance, Motegrity, MiraLAX.  Sits marker study with retained stool but no retained markers 02/2019.  Currently doing well on Senokot daily. - Continue senokot -- if no longer responding, may consider lactulose (?) in the future as other medications have been ineffective.  [facility] 07/10/2023, 11:19 AM  Cc: Agapito Games, *

## 2023-07-10 NOTE — Patient Instructions (Signed)
If your blood pressure at your visit was 140/90 or greater, please contact your primary care physician to follow up on this. ______________________________________________________  If you are age 50 or older, your body mass index should be between 23-30. Your Body mass index is 31.74 kg/m. If this is out of the aforementioned range listed, please consider follow up with your Primary Care Provider.  If you are age 23 or younger, your body mass index should be between 19-25. Your Body mass index is 31.74 kg/m. If this is out of the aformentioned range listed, please consider follow up with your Primary Care Provider.  ________________________________________________________  The [facility] providers would like to encourage you to use [facility] to communicate with providers for non-urgent requests or questions.  Due to long hold times on the telephone, sending your provider a message by [facility] may be a faster and more efficient way to get a response.  Please allow 48 business hours for a response.  Please remember that this is for non-urgent requests.  _______________________________________________________  Due to recent changes in healthcare laws, you may see the results of your imaging and laboratory studies on MyChart before your provider has had a chance to review them.  We understand that in some cases there may be results that are confusing or concerning to you. Not all laboratory results come back in the same time frame and the provider may be waiting for multiple results in order to interpret others.  Please give [place] 48 hours in order for your provider to thoroughly review all the results before contacting the office for clarification of your results.   You have been scheduled for a modified barium swallow on Tuesday, January 7th at 1:00 pm. Please arrive 30 minutes prior to your test for registration. You will go to [facility] (1st Floor) for your appointment. Should you need to cancel  or reschedule your appointment, please contact [phone] [employer]) or [phone] [facility]). _____________________________________________________________________ A Modified Barium Swallow Study, or MBS, is a special x-ray that is taken to check swallowing skills. It is carried out by a Marine scientist and a Warehouse manager (SLP). During this test, yourmouth, throat, and esophagus, a muscular tube which connects your mouth to your stomach, is checked. The test will help you, your doctor, and the SLP plan what types of foods and liquids are easier for you to swallow. The SLP will also identify positions and ways to help you swallow more easily and safely. What will happen during an MBS? You will be taken to an x-ray room and seated comfortably. You will be asked to swallow small amounts of food and liquid mixed with barium. Barium is a liquid or paste that allows images of your mouth, throat and esophagus to be seen on x-ray. The x-ray captures moving images of the food you are swallowing as it travels from your mouth through your throat and into your esophagus. This test helps identify whether food or liquid is entering your lungs (aspiration). The test also shows which part of your mouth or throat lacks strength or coordination to move the food or liquid in the right direction. This test typically takes 30 minutes to 1 hour to complete.  Thank you for trusting me with your gastrointestinal care!   Boone Master, PA  _______________________________________________________________________

## 2023-07-11 ENCOUNTER — Telehealth: Payer: Self-pay | Admitting: Gastroenterology

## 2023-07-11 NOTE — Telephone Encounter (Signed)
This patient was seen by [facility] yesterday and she failed to get a doctor's note when she left.  She called today asking if one could be emailed to her at [email or web address].  Thank you.

## 2023-07-12 NOTE — Telephone Encounter (Signed)
MyChart message to patient. She requested the letter be mailed to her. mailed

## 2023-07-13 NOTE — Progress Notes (Signed)
Agree with the assessment and plan as outlined by Boone Master, PA-C.  Her previous EGD with Bravo did not show any abnormal esophageal acid exposure (i.e. she does not have GERD), but instead was more consistent with esophageal hypersensitivity.  Not sure if continuing acid suppression medications is going to make any difference.  Previously treated with nortriptyline which provided response and is again more consistent with esophageal hypersensitivity.  Unsure if the cough is from oropharyngeal dysfunction.  Agree with MBS.  Lastly, there are some literature reports of persistent globus sensation in patients with gastric inlet patch.  This is very uncommon though and seldom treated with EGD with ablation, but I suppose something that we could consider in the future.  Otherwise, again I do not think that gastroesophageal reflux is her prime issue.  Tara Donate, DO, [facility]

## 2023-07-28 ENCOUNTER — Telehealth: Payer: Self-pay

## 2023-07-28 NOTE — Telephone Encounter (Signed)
 Copied from CRM [id number]. Topic: Clinical - Medical Advice >> Jul 28, 2023 11:38 AM Franky GRADE wrote: Reason for CRM: Patient has a history of thrush. She currently experiencing symptoms and would like to know if Dr.Metheney can prescribe something. I advised that an appointment would be required and scheduled for 08/01/2023.

## 2023-07-31 NOTE — Telephone Encounter (Signed)
 Please call patient.  I do have a 3:00 today if she would prefer to come in today instead of tomorrow.

## 2023-08-01 ENCOUNTER — Ambulatory Visit: Payer: Self-pay | Admitting: Physician Assistant

## 2023-08-01 ENCOUNTER — Ambulatory Visit (HOSPITAL_COMMUNITY)
Admission: RE | Admit: 2023-08-01 | Discharge: 2023-08-01 | Disposition: A | Discharge status: Home / Self Care | Payer: 59 | Source: Ambulatory Visit | Attending: *Deleted | Admitting: *Deleted

## 2023-08-01 ENCOUNTER — Ambulatory Visit (HOSPITAL_COMMUNITY)
Admission: RE | Admit: 2023-08-01 | Discharge: 2023-08-01 | Disposition: A | Discharge status: Home / Self Care | Payer: 59 | Source: Ambulatory Visit | Attending: Gastroenterology | Admitting: Gastroenterology

## 2023-08-01 DIAGNOSIS — R1310 Dysphagia, unspecified: Secondary | ICD-10-CM | POA: Insufficient documentation

## 2023-08-01 DIAGNOSIS — T17308A Unspecified foreign body in larynx causing other injury, initial encounter: Secondary | ICD-10-CM | POA: Diagnosis not present

## 2023-08-01 DIAGNOSIS — R053 Chronic cough: Secondary | ICD-10-CM | POA: Diagnosis present

## 2023-08-01 NOTE — Telephone Encounter (Signed)
 Just seen today. She has an appointment today.

## 2023-08-02 ENCOUNTER — Ambulatory Visit: Payer: 59 | Admitting: Family Medicine

## 2023-08-03 ENCOUNTER — Ambulatory Visit (INDEPENDENT_AMBULATORY_CARE_PROVIDER_SITE_OTHER): Payer: 59 | Admitting: Family Medicine

## 2023-08-03 ENCOUNTER — Other Ambulatory Visit: Payer: Self-pay | Admitting: Internal Medicine

## 2023-08-03 ENCOUNTER — Encounter: Payer: Self-pay | Admitting: Family Medicine

## 2023-08-03 ENCOUNTER — Telehealth: Payer: Self-pay

## 2023-08-03 VITALS — BP 131/80 | HR 93 | Ht 60.0 in | Wt 162.0 lb

## 2023-08-03 DIAGNOSIS — Q383 Other congenital malformations of tongue: Secondary | ICD-10-CM

## 2023-08-03 DIAGNOSIS — R058 Other specified cough: Secondary | ICD-10-CM

## 2023-08-03 DIAGNOSIS — R682 Dry mouth, unspecified: Secondary | ICD-10-CM | POA: Diagnosis not present

## 2023-08-03 DIAGNOSIS — K581 Irritable bowel syndrome with constipation: Secondary | ICD-10-CM

## 2023-08-03 MED ORDER — FLUCONAZOLE 150 MG PO TABS: 150.0000 mg | ORAL_TABLET | ORAL | 0 refills | Status: DC

## 2023-08-03 MED ORDER — NORTRIPTYLINE HCL 25 MG PO CAPS: 25.0000 mg | ORAL_CAPSULE | Freq: Every day | ORAL | 0 refills | Status: DC

## 2023-08-03 NOTE — Progress Notes (Signed)
 Acute Office Visit  Subjective:     Patient ID: Tara Howard, female    DOB: 28-Mar-1971, 53 y.o.   MRN: [id number]  Chief Complaint  Patient presents with   [name]    HPI Patient is in today for possible thrush.  For a couple of weeks she is just felt like there is been a soreness and a thicker coating on her tongue.  She says it does not have the usual red cracked down the center that she seen before with rash.  She is currently on nortriptyline  and ever since she has been on the particular medication is called significant dry mouth she has tried the Biotene and XyliMelts she usually chews gum which helps a little bit.  Just really at her wits end with also trying to find something to help with her irritable bowel/constipation.  She has tried multiple drugs and just really has not gotten significant relief.  She does not always get the urge to go and less she is taking some of his medications.  Just had an esophageal barium swallow.  Which was negative for aspiration.  They think she may have esophageal sensitivity she is currently on nortriptyline  for that which she feels is causing a lot of the persistent dryness.  ROS      Objective:    BP 131/80   Pulse 93   Ht 5' (1.524 m)   Wt 162 lb (73.5 kg)   SpO2 100%   BMI 31.64 kg/m    Physical Exam Vitals reviewed.  Constitutional:      Appearance: Normal appearance.  HENT:     Head: Normocephalic.     Mouth/Throat:     Pharynx: Oropharynx is clear.     Comments: Her tongue she does have what looks more like hypertrophied papilla especially little bit more posterior into the right.  But I do not see a well-demarcated area with a border indicating thrush.  So we did go ahead and do a swab to evaluate. Eyes:     Conjunctiva/sclera: Conjunctivae normal.  Pulmonary:     Effort: Pulmonary effort is normal.  Neurological:     Mental Status: She is alert and oriented to person, place, and time.  Psychiatric:        Mood  and Affect: Mood normal.        Behavior: Behavior normal.     No results found for any visits on 08/03/23.      Assessment & Plan:   Problem List Items Addressed This Visit       Digestive   IRRITABLE BOWEL SYNDROME - C     Other   Upper airway cough syndrome   Asthma and allergies have been ruled out shows she is completely off of her inhalers and allergy  meds.      Dry mouth   Other Visit Diagnoses       Tongue abnormality    -  Primary   Relevant Orders   Yeast Only, Culture      The tongue appears to have more hypertrophy of the papillae versus a discrete thrush type rash.  We did do a swab to evaluate for yeast.  In the meantime I did send over prescription for Diflucan  to fill only if she feels like her symptoms are getting worse or if the swab comes back positive for yeast.  If negative then can consider very gently using a tongue scraper.  Mouth-secondary to medication side effect.  She plans on  talking to Dr. San about it to see if maybe there is some other options that she could try.  She has already tried some of the other over-the-counter products like Biotene and XyliMelts, she tries to keep her mouth moisturized she tries to chew gum which does help a little bit.  [facility]-hopefully can follow-up with Dr. San soon.  It sounds like she is tried multiple medications without significant improvement in her symptoms.  Meds ordered this encounter  Medications   fluconazole  (DIFLUCAN ) 150 MG tablet    Sig: Take 1 tablet (150 mg total) by mouth every other day.    Dispense:  3 tablet    Refill:  0    No follow-ups on file.  Dorothyann Byars, MD

## 2023-08-03 NOTE — Assessment & Plan Note (Signed)
 Asthma and allergies have been ruled out shows she is completely off of her inhalers and allergy meds.

## 2023-08-03 NOTE — Telephone Encounter (Signed)
 Task completed. Patient has been updated regarding the letter. Per patient's request, work note mailed to address on file.

## 2023-08-03 NOTE — Telephone Encounter (Signed)
 Copied from CRM [id number]. Topic: General - Other >> Aug 03, 2023 11:30 AM Susanna ORN wrote: Reason for CRM: Patient states she forgot to get a note for work from her visit today with Dr. Alvan. Wants to know if it can be emailed to her or either put in the mail. She states she can't access her MyChart. Please give her a call back to advise. CB #: [id number].

## 2023-08-03 NOTE — Addendum Note (Signed)
 Addended by: Richardson Chiquito on: 08/03/2023 01:23 PM   Modules accepted: Orders

## 2023-08-03 NOTE — Telephone Encounter (Signed)
 Patient calling to request prescription change. Please advise.

## 2023-08-08 ENCOUNTER — Telehealth: Payer: Self-pay | Admitting: Gastroenterology

## 2023-08-08 NOTE — Telephone Encounter (Signed)
 Patient calls stating that she is having terrible issues with constipation (chronic). She notes that she is extremely bloated and miserable. Has tried most every prescription medication for constipation with little to no effectiveness. Patient states though that she did have some success with small bowel movements with some old lactulose  she had on hand. States she has been taking 1 premade cup twice daily but will run out tonight. Wants to know if we are willing to send a new prescription for her.   Patient is advised that should she go more than 48-72 hours without a bowel movement, be unable to pass gas, have severe abdominal pain and/or develop nausea/vomiting with these symptoms, she should report to the emergency room for further evaluation.  Patient is scheduled for an appointment with Dr San on 08/16/23

## 2023-08-08 NOTE — Telephone Encounter (Signed)
 Inbound call from patient requesting a call from the nurse to discuss IBS. Please advise.

## 2023-08-09 MED ORDER — LACTULOSE 20 GM/30ML PO SOLN: 30.0000 mL | Freq: Two times a day (BID) | ORAL | 0 refills | Status: DC

## 2023-08-09 NOTE — Telephone Encounter (Signed)
 Rx sent.

## 2023-08-09 NOTE — Telephone Encounter (Signed)
 Contacted patient to advise that we will send lactulose  10 g/15 ml to her pharmacy for her to take twice daily. Patient states that the prescription she has been taking (over 51 year old) says it is 20 g. Shall I provide her with the 20 g/30 ml dosing again (I realize that these two dosages are essentially the same ratios, but think it may make her feel better to get the "higher" dosing)?

## 2023-08-09 NOTE — Telephone Encounter (Signed)
 [facility]- I cannot find a 30 g/15 ml solution. There is a 10 g/15 ml solution. If you want 30 g, I can just have her take 45 ml. Is that what you want?

## 2023-08-14 ENCOUNTER — Encounter: Payer: Self-pay | Admitting: Family Medicine

## 2023-08-14 LAB — YEAST ONLY, CULTURE

## 2023-08-14 NOTE — Progress Notes (Signed)
Hi Tara Howard, no thrush seen on the swab. I hope you are feeling better.

## 2023-08-16 ENCOUNTER — Ambulatory Visit: Payer: 59 | Admitting: Gastroenterology

## 2023-08-17 ENCOUNTER — Ambulatory Visit: Payer: 59 | Admitting: Family Medicine

## 2023-08-17 ENCOUNTER — Encounter: Payer: Self-pay | Admitting: Family Medicine

## 2023-08-17 VITALS — BP 132/81 | HR 94 | Ht 60.0 in | Wt 157.0 lb

## 2023-08-17 DIAGNOSIS — G43009 Migraine without aura, not intractable, without status migrainosus: Secondary | ICD-10-CM

## 2023-08-17 DIAGNOSIS — F5101 Primary insomnia: Secondary | ICD-10-CM | POA: Diagnosis not present

## 2023-08-17 DIAGNOSIS — K219 Gastro-esophageal reflux disease without esophagitis: Secondary | ICD-10-CM

## 2023-08-17 DIAGNOSIS — R232 Flushing: Secondary | ICD-10-CM

## 2023-08-17 DIAGNOSIS — T50905A Adverse effect of unspecified drugs, medicaments and biological substances, initial encounter: Secondary | ICD-10-CM

## 2023-08-17 MED ORDER — VENLAFAXINE HCL 37.5 MG PO TABS: ORAL_TABLET | ORAL | 0 refills | Status: DC

## 2023-08-17 NOTE — Assessment & Plan Note (Signed)
Now she is gena continue with the [place].

## 2023-08-17 NOTE — Assessment & Plan Note (Signed)
Try to taper off of her rabeprazole and go down to 10 mg.  And take some digestive enzymes to see if that is helpful.  She does follow-up with Dr. Barron Alvine.

## 2023-08-17 NOTE — Progress Notes (Signed)
Established Patient Office Visit  Subjective  Patient ID: Tara Howard, female    DOB: Jun 21, 1971  Age: 53 y.o. MRN: [id number]  Chief Complaint  Patient presents with   Medical Management of Chronic Issues    HPI She is consulted with a naturopath and has decided she really wants to come off of a lot of her prescription medications and try to eat more whole foods and use some supplements to help her feel better and get more healthy.  She is interested particularly in tapering off of her venlafaxine and rabeprazole over the next few weeks and then eventually working on the gabapentin and the trazodone.  The supplements are primarily mushroom based but do have some additional gradients including [place] Gonda and [place] main etc.  Already started making some dietary changes.  Is also now off of tamoxifen.  And doing well.  Still struggling with some hot flashes but again wanting to hopefully taper off of the gabapentin and work on lifestyle changes to improve her hot flashes.  She is off any additional vitamins or mineral supplements.  Start taking an herbal supplement for sleep which does contain Virl Axe and valerian root.     ROS    Objective:     BP 132/81   Pulse 94   Ht 5' (1.524 m)   Wt 157 lb (71.2 kg)   SpO2 100%   BMI 30.66 kg/m    Physical Exam Vitals and nursing note reviewed.  Constitutional:      Appearance: Normal appearance.  HENT:     Head: Normocephalic and atraumatic.  Eyes:     Conjunctiva/sclera: Conjunctivae normal.  Cardiovascular:     Rate and Rhythm: Normal rate and regular rhythm.  Pulmonary:     Effort: Pulmonary effort is normal.     Breath sounds: Normal breath sounds.  Skin:    General: Skin is warm and dry.  Neurological:     Mental Status: She is alert.  Psychiatric:        Mood and Affect: Mood normal.      No results found for any visits on 08/17/23.    The ASCVD Risk score (Arnett DK, et al., 2019) failed to  calculate for the following reasons:   Cannot find a previous HDL lab   Cannot find a previous total cholesterol lab    Assessment & Plan:   Problem List Items Addressed This Visit       Cardiovascular and Mediastinum   Migraines   Now she is gena continue with the Qulipta.      Relevant Medications   venlafaxine (EFFEXOR) 37.5 MG tablet   Hot flash due to medication - Primary   For now she is gena start with tapering off the venlafaxine and I would recommend a slow taper over about 4 weeks instead of 2 weeks.  We can always speeded up or slow it down depending on how she is doing this was originally started for the hot flashes as well as the gabapentin we can always circle back to the gabapentin after tapering off the venlafaxine.  She is gena continue to work on some lifestyle changes and more whole foods to see if that is helpful as well.        Digestive   GERD   Try to taper off of her rabeprazole and go down to 10 mg.  And take some digestive enzymes to see if that is helpful.  She does follow-up with Dr. Barron Alvine.  Other   Insomnia   Still struggling with sleep quality in part due to her hot flashes.  For now we will continue with trazodone that she already has cut back to 1 tab daily instead of 1-1/2.  But eventually would like to circle back to tapering off that medication as well.       If need labs in a few weeks let me know.    No follow-ups on file.    Nani Gasser, MD

## 2023-08-17 NOTE — Assessment & Plan Note (Signed)
For now she is gena start with tapering off the venlafaxine and I would recommend a slow taper over about 4 weeks instead of 2 weeks.  We can always speeded up or slow it down depending on how she is doing this was originally started for the hot flashes as well as the gabapentin we can always circle back to the gabapentin after tapering off the venlafaxine.  She is gena continue to work on some lifestyle changes and more whole foods to see if that is helpful as well.

## 2023-08-17 NOTE — Assessment & Plan Note (Signed)
Still struggling with sleep quality in part due to her hot flashes.  For now we will continue with trazodone that she already has cut back to 1 tab daily instead of 1-1/2.  But eventually would like to circle back to tapering off that medication as well.

## 2023-08-22 ENCOUNTER — Telehealth: Payer: Self-pay | Admitting: Family Medicine

## 2023-08-22 NOTE — Telephone Encounter (Signed)
Copied from CRM [id number]. Topic: General - Other >> Aug 21, 2023  8:03 AM Gaetano Hawthorne wrote: Reason for CRM: Patient is requesting a note for work for last Thursday's visit. If the note can be mail to their home address that would be great. The Patient mentioned that she had an appointment elsewhere that day if Dr. Linford Arnold skip the part where she typically says and she can return to work the same day that would assist the patient.

## 2023-08-23 NOTE — Telephone Encounter (Signed)
Letter written and mailed to patient.

## 2023-08-25 MED ORDER — VENLAFAXINE HCL 37.5 MG PO TABS: 37.5000 mg | ORAL_TABLET | Freq: Two times a day (BID) | ORAL | 3 refills | Status: DC

## 2023-08-28 ENCOUNTER — Telehealth: Payer: Self-pay | Admitting: Gastroenterology

## 2023-08-28 NOTE — Telephone Encounter (Signed)
Patient called requesting to speak with a nurse to schedule EGD. Patient is wondering if she would have to be seen in the office.

## 2023-08-28 NOTE — Telephone Encounter (Signed)
Left message for patient to call back.  She would need to be seen in office prior to being scheduled for endoscopy. She was supposed to follow up on 08/16/23 in the office with Dr Barron Alvine to discuss esophageal hypersensitivity and MBS results, but looks like she cancelled this out because she was "going off all medications and being treated by a holistic doctor."

## 2023-08-28 NOTE — Telephone Encounter (Signed)
I have spoken to patient to advise she does need to follow up with Dr Barron Alvine to discuss any additional testing that would be appropriate. She is scheduled for his next available opening 09/25/23. She verbalizes understanding.

## 2023-09-04 ENCOUNTER — Telehealth (INDEPENDENT_AMBULATORY_CARE_PROVIDER_SITE_OTHER): Payer: 59 | Admitting: Family Medicine

## 2023-09-04 DIAGNOSIS — R051 Acute cough: Secondary | ICD-10-CM

## 2023-09-04 DIAGNOSIS — J069 Acute upper respiratory infection, unspecified: Secondary | ICD-10-CM | POA: Diagnosis not present

## 2023-09-04 MED ORDER — HYDROCOD POLI-CHLORPHE POLI ER 10-8 MG/5ML PO SUER: 5.0000 mL | Freq: Two times a day (BID) | ORAL | 0 refills | Status: DC | PRN

## 2023-09-04 NOTE — Assessment & Plan Note (Signed)
 Pt on video visits for symptoms that resemble flu. With her being out of the 48 hour window for tamiflu  I don't think it is necessary to have patient come to clinic and get tested since we cannot use the antiviral. At this point, I recommend supportive care such as vit c,d,zinc increase fluids and rest. Also given tussionex for cough. Pmp reviewed and verified with no red flags. Pt has had this medication before and tolerated it well.

## 2023-09-04 NOTE — Progress Notes (Signed)
   Acute Office Visit  Subjective:     Patient ID: Elese Salsbury, female    DOB: May 02, 1971, 53 y.o.   MRN: [id number]  Chief Complaint  Patient presents with   Cough    Chills body aches, headache since fri night   I connected with  Jaslin Fiala on 09/04/23 by a video and audio enabled telemedicine application and verified that I am speaking with the correct person using two identifiers.  Patient Location: Home  Provider Location: Office/Clinic  I discussed the limitations of evaluation and management by telemedicine. The patient expressed understanding and agreed to proceed.   HPI Patient on video for migraines, chills, body aches that started 4 days ago. Has taken tylenol . Has not taken her temperature. No wheezing, shortness of breath.  Does admit to coughing especially at night. 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 Denies any GI symptoms. Appetite has been decreased but she is still eating and drinking appropriately.  Review of Systems  Constitutional:  Positive for chills and malaise/fatigue. Negative for fever.  Respiratory:  Positive for cough. Negative for shortness of breath and wheezing.         Objective:     Physical Exam Vitals reviewed.  Constitutional:      Appearance: She is well-developed.  HENT:     Head: Normocephalic and atraumatic.  Eyes:     Conjunctiva/sclera: Conjunctivae normal.  Cardiovascular:     Rate and Rhythm: Normal rate.  Pulmonary:     Effort: Pulmonary effort is normal.  Skin:    General: Skin is dry.     Coloration: Skin is not pale.  Neurological:     Mental Status: She is alert and oriented to person, place, and time.  Psychiatric:        Behavior: Behavior normal.     No results found for any visits on 09/04/23.      Assessment & Plan:   Problem List Items Addressed This Visit       Respiratory   Viral URI - Primary   Pt on video visits for symptoms that resemble flu.  With her being out of the 48 hour window for tamiflu  I don't think it is necessary to have patient come to clinic and get tested since we cannot use the antiviral. At this point, I recommend supportive care such as vit c,d,zinc increase fluids and rest. Also given tussionex for cough. Pmp reviewed and verified with no red flags. Pt has had this medication before and tolerated it well.      Other Visit Diagnoses       Acute cough       Relevant Medications   chlorpheniramine-HYDROcodone  (TUSSIONEX) 10-8 MG/5ML       Meds ordered this encounter  Medications   chlorpheniramine-HYDROcodone  (TUSSIONEX) 10-8 MG/5ML    Sig: Take 5 mLs by mouth every 12 (twelve) hours as needed for cough (cough, will cause drowsiness.).    Dispense:  115 mL    Refill:  0    No follow-ups on file.  Josepha Nickels, DO

## 2023-09-05 ENCOUNTER — Telehealth: Payer: Self-pay

## 2023-09-05 ENCOUNTER — Telehealth: Payer: Self-pay | Admitting: *Deleted

## 2023-09-05 NOTE — Telephone Encounter (Signed)
Yes, that would work

## 2023-09-05 NOTE — Telephone Encounter (Signed)
Attempted call to patient- phone rang without answer. Could not leave a voice mail message.

## 2023-09-05 NOTE — Telephone Encounter (Signed)
Copied from CRM [id number]. Topic: Clinical - Medical Advice >> Sep 05, 2023  2:00 PM Carlatta H wrote: Reason for CRM: Patient called and stated that her cough is getting worse and she has a bubbling feeling in her chest//She was told yesterday to callback if she wasn't feeling better or if cough was getting worse

## 2023-09-05 NOTE — Telephone Encounter (Signed)
Pt wants to ween off of the Gabapentin. She would like to know if it would be ok to take one in the morning and one in the evening.   Will fwd to pcp for review and recommendations.

## 2023-09-06 ENCOUNTER — Ambulatory Visit: Payer: 59

## 2023-09-06 ENCOUNTER — Other Ambulatory Visit: Payer: Self-pay | Admitting: Family Medicine

## 2023-09-06 ENCOUNTER — Telehealth: Payer: Self-pay | Admitting: Family Medicine

## 2023-09-06 DIAGNOSIS — R051 Acute cough: Secondary | ICD-10-CM

## 2023-09-06 DIAGNOSIS — R059 Cough, unspecified: Secondary | ICD-10-CM | POA: Diagnosis not present

## 2023-09-06 NOTE — Telephone Encounter (Signed)
Patient informed and will come for CXR today .

## 2023-09-06 NOTE — Telephone Encounter (Signed)
Spoke to the patient - her decision is to come off of the med completely. She is requesting to taper off the medication safely without any adverse reactions. Please advise.

## 2023-09-06 NOTE — Telephone Encounter (Signed)
Sorry, I am going to need clarification am not really sure what is being asked if she wanting me to spell out a taper for her to come completely off?  The initial question was just if it was okay for her to go down to twice a day.  So I am not really sure what she is asking.  Maybe we just need to clarify.

## 2023-09-06 NOTE — Telephone Encounter (Signed)
Copied from CRM [id number]. Topic: General - Other >> Sep 06, 2023  9:10 AM Clayton Bibles wrote: Reason for CRM: Mirela needs a work excuse to be off work from 09/07/23 to 09/11/23 due to not better from the flu.  Please put it in MyChart  Questions please message through MyChart or call

## 2023-09-06 NOTE — Telephone Encounter (Signed)
Patient advised of provider's recommendation. She would like to know how long she should follow the treatment plan. Please advise, thanks.

## 2023-09-07 NOTE — Telephone Encounter (Unsigned)
Copied from CRM [id number]. Topic: Clinical - Lab/Test Results >> Sep 07, 2023  3:33 PM Tara Howard wrote: Reason for CRM: Patient is calling regarding xrays done yesterday. Advised they have not been reviewed and once available she will be notified.

## 2023-09-08 MED ORDER — GABAPENTIN 100 MG PO CAPS
ORAL_CAPSULE | ORAL | 0 refills | Status: DC
Start: 2023-09-08 — End: 2023-10-09

## 2023-09-08 NOTE — Telephone Encounter (Signed)
Please have her take the 300 mg twice a day for 10 days and then she can pick up the new prescription for the 100 mg capsules.  I wrote a 30-day taper on that to get her completely off.  Meds ordered this encounter  Medications   gabapentin (NEURONTIN) 100 MG capsule    Sig: Take 2 capsules (200 mg total) by mouth 2 (two) times daily for 10 days, THEN 1 capsule (100 mg total) 2 (two) times daily for 10 days, THEN 1 capsule (100 mg total) at bedtime for 10 days.    Dispense:  70 capsule    Refill:  0

## 2023-09-13 NOTE — Telephone Encounter (Signed)
Task completed. Patient has been notified of provider's note and will pick up the lower dose gabapentin rx from the pharmacy.

## 2023-09-19 ENCOUNTER — Encounter: Payer: Self-pay | Admitting: Family Medicine

## 2023-09-25 ENCOUNTER — Encounter: Payer: Self-pay | Admitting: Gastroenterology

## 2023-09-25 ENCOUNTER — Ambulatory Visit: Payer: 59 | Admitting: Gastroenterology

## 2023-09-25 VITALS — BP 110/80 | HR 92 | Ht 64.0 in | Wt 158.0 lb

## 2023-09-25 DIAGNOSIS — K5909 Other constipation: Secondary | ICD-10-CM | POA: Diagnosis not present

## 2023-09-25 DIAGNOSIS — K2289 Other specified disease of esophagus: Secondary | ICD-10-CM | POA: Diagnosis not present

## 2023-09-25 DIAGNOSIS — D123 Benign neoplasm of transverse colon: Secondary | ICD-10-CM

## 2023-09-25 DIAGNOSIS — Z860100 Personal history of colon polyps, unspecified: Secondary | ICD-10-CM

## 2023-09-25 DIAGNOSIS — K581 Irritable bowel syndrome with constipation: Secondary | ICD-10-CM

## 2023-09-25 DIAGNOSIS — R1310 Dysphagia, unspecified: Secondary | ICD-10-CM | POA: Diagnosis not present

## 2023-09-25 DIAGNOSIS — R1319 Other dysphagia: Secondary | ICD-10-CM

## 2023-09-25 DIAGNOSIS — R053 Chronic cough: Secondary | ICD-10-CM | POA: Diagnosis not present

## 2023-09-25 NOTE — Progress Notes (Signed)
 Chief Complaint:    Chronic cough  GI History: 53 year old female with history of Breast CA (diagnosed 2020 now s/p b/l mastectomy; on tamoxifen until 2025), IBS-C, Heartburn.   1) Hx of IBS-C.  Index sxs were constipation predominant. Has a long hx of previously diagnosed IBS-C, which was well controlled with dietary mods, high fiber diet, and Miralax. Intermittent generalized abdominal cramping and abdominal bloating w/ visible abdominal distension. No n/v/f/c. Previously tried Linzess, and stopped due to diarrhea and abdominal pain after a few days.  Trialed Amitiza in 08/2018, but not effective (although may have only tried the 8 MCG dose).  Started on Trulance, which initially worked, but reverted back to constipation.  Sitz marker study with retained stool but no retained markers in 02/2019.  Trialed MiraLAX with good response, but increasing symptoms in 05/2022.   Responded to purge prep, then started higher dose Amitiza 24 mcg bid. Otherwise, no hematochezia, melena.   2) Esophageal hypersensitivity/heartburn: Longstanding history of heartburn and regurgitation, treated with Nexium for years, daily with occassional need for 2nd dose for breakthrough sxs. Worsening sxs in 2020 with increasing breakthrough sxs. EGD in 2012 was only n/f gastritis and esophagitis per patient. No dysphagia or odynophagia, and no early satiety.   For ongoing reflux, started on Dexilant, which was initially quite effective, eventually lost efficacy.  EGD essentially normal in 08/2018.  Diagnosed with esophageal hypersensitivity on EGD with Bravo in 06/2019.  No improvement with FD guard.  No change with high-dose Protonix.  Started nortriptyline ([name]) and titrated up to 50 mg qhs with good clinical response.  Trialed Aciphex in 07/2021.   Endoscopic Hx: - EGD (09/2010): Erythema in antrum compatible with gastritis. Otherwise normal. No actual report in EMR for review.  - EGD (08/2018, Dr. Barron Alvine):  Normal -Colonoscopy (08/2018, Dr. Barron Alvine): 2 small benign polyps in transverse colon, suboptimal prep.  Biopsies negative for [facility] -EGD with Bravo (03/2019, Dr. Barron Alvine): Gastric inlet patch, regular Z-line, normal stomach/duodenum.  Hill grade 2.  Bravo attached but failed communication with recorder -EGD with Bravo (06/2019, Dr. Barron Alvine): Gastric inlet patch, regular Z-line, normal stomach/duodenum.  Hill grade 2.  Bravo placed -Bravo (06/2019): Normal, [place]ter 3.4.  Good symptom correlation for heartburn with reflux.  Normal number of reflux events.  Consistent with esophageal hypersensitivity. - Colonoscopy (10/2022): 4 mm transverse colon polyp.  Repeat in 7 years  - 07/2023: MBS: Normal  HPI:     Patient is a 53 y.o. female presenting to the Gastroenterology Clinic for continued evaluation of chronic cough and throat clearing.  Does have intermittent hoarseness/raspy voice.  Has been previously treated with [name] for esophageal hypersensitivity with good clinical response, but she eventually titrated off.  Was last seen in the GI clinic on 07/10/2023 by Cira Servant for these same symptoms.  No change with escalation of medications from the Allergy Clinic (has since stopped all allergy medications).  Scheduled for MBS in 07/2023 which was normal.  Normal CXR last month.  She has made significant dietary modifications in January, with overall improvement in overall health.  However, still having chronic cough.  Will also have dysphagia to capsules.  No issue tolerating foods or liquids though. Currently weaning Effexor and Neurontin.   Constipation currently well-controlled with dietary modifications. Had been off all laxatives. Restarted Miralax last week due to cough medicine with hydrocodone in it.   Following with Naturopath now. Had been using "Emma" supplement for constipation.   Review of systems:  No chest pain, no SOB, no fevers, no urinary sx   Past Medical  History:  Diagnosis Date   Allergy    allergist in [place]   Anxiety    Asthma    Breast cancer (HCC)    Chronic headache    COVID-19 06/01/2022   Depression    Fibromyalgia    Fibromyalgia 2006   pain / headaches   Foot trauma, right, initial encounter 01/29/2020   GERD (gastroesophageal reflux disease)    IBS (irritable bowel syndrome)    Osteoarthritis    in her hips   Vaginismus     Patient's surgical history, family medical history, social history, medications and allergies were all reviewed in Epic    Current Outpatient Medications  Medication Sig Dispense Refill   acetaminophen (TYLENOL) 500 MG tablet Take 500 mg by mouth every 6 (six) hours as needed.     chlorpheniramine-HYDROcodone (TUSSIONEX) 10-8 MG/5ML Take 5 mLs by mouth every 12 (twelve) hours as needed for cough (cough, will cause drowsiness.). 115 mL 0   gabapentin (NEURONTIN) 100 MG capsule Take 2 capsules (200 mg total) by mouth 2 (two) times daily for 10 days, THEN 1 capsule (100 mg total) 2 (two) times daily for 10 days, THEN 1 capsule (100 mg total) at bedtime for 10 days. 70 capsule 0   RABEprazole (ACIPHEX) 20 MG tablet TAKE 1 TABLET BY MOUTH TWICE DAILY(IN THE MORNING AND AT BEDTIME) 180 tablet 3   traZODone (DESYREL) 100 MG tablet TAKE 1 & 1/2 TABLETS (150MG  TOTAL) BY MOUTH AT BEDTIME 135 tablet 0   venlafaxine (EFFEXOR) 37.5 MG tablet Take 1 tablet (37.5 mg total) by mouth 2 (two) times daily. 60 tablet 3   No current facility-administered medications for this visit.    Physical Exam:     BP 110/80   Pulse 92   Ht 5\' 4"  (1.626 m)   Wt 158 lb (71.7 kg)   BMI 27.12 kg/m   GENERAL:  Pleasant female in NAD PSYCH: : Cooperative, normal affect Musculoskeletal:  Normal muscle tone, normal strength NEURO: Alert and oriented x 3, no focal neurologic deficits   IMPRESSION and PLAN:    1) Chronic cough Again discussed potential GI etiologies for chronic cough.  Do not feel this is reflux mediated  given her history of a normal Bravo study and previous EGD findings.  Additionally, no appreciable improvement with trials of PPI in the past, and not any better with current use of Aciphex.  MBS unremarkable.  CXR normal.  Patient requesting EGD to ensure no esophageal pathology. - EGD scheduled - Will start weaning off Aciphex - If no change with Aciphex wean and EGD otherwise unremarkable, referral to the Pulmonary Clinic  2) Dysphagia Intermittent dysphagia to capsules. - EGD to evaluate for mucosal/luminal pathology, luminal narrowing, stricture, etc. with dilation and/or biopsies as appropriate - We did discuss the rare possibility of symptomatic gastric inlet patch with the role/utility of local RFA  3) Chronic constipation - Well-controlled with current dietary modifications  4) Esophageal hypersensitivity - Currently on venlafaxine, but she would like to taper off.  Will first taper off Aciphex, then can remove venlafaxine if needed.  5) History of colon polyps - Repeat colonoscopy in 2031 for ongoing polyp surveillance     The indications, risks, and benefits of EGD were explained to the patient in detail. Risks include but are not limited to bleeding, perforation, adverse reaction to medications, and cardiopulmonary compromise. Sequelae include but are not  limited to the possibility of surgery, hospitalization, and mortality. The patient verbalized understanding and wished to proceed. All questions answered, referred to scheduler. Further recommendations pending results of the exam.         Shellia Cleverly ,DO, FACG 09/25/2023, 9:23 AM

## 2023-09-25 NOTE — Patient Instructions (Addendum)
 _______________________________________________________  If your blood pressure at your visit was 140/90 or greater, please contact your primary care physician to follow up on this.  If you are age 51 or younger, your body mass index should be between 19-25. Your Body mass index is 27.12 kg/m. If this is out of the aformentioned range listed, please consider follow up with your Primary Care Provider.  ________________________________________________________  The [facility] providers would like to encourage you to use [facility] to communicate with providers for non-urgent requests or questions.  Due to long hold times on the telephone, sending your provider a message by [facility] may be a faster and more efficient way to get a response.  Please allow 48 business hours for a response.  Please remember that this is for non-urgent requests.  _______________________________________________________  Bonita Quin have been scheduled for an endoscopy. Please follow written instructions given to you at your visit today.  If you use inhalers (even only as needed), please bring them with you on the day of your procedure.  If you take any of the following medications, they will need to be adjusted prior to your procedure:   DO NOT TAKE 7 DAYS PRIOR TO TEST- Trulicity (dulaglutide) Ozempic, Wegovy (semaglutide) Mounjaro (tirzepatide) Bydureon Bcise (exanatide extended release)  DO NOT TAKE 1 DAY PRIOR TO YOUR TEST Rybelsus (semaglutide) Adlyxin (lixisenatide) Victoza (liraglutide) Byetta (exanatide) ___________________________________________________________________________  Due to recent changes in healthcare laws, you may see the results of your imaging and laboratory studies on MyChart before your provider has had a chance to review them.  We understand that in some cases there may be results that are confusing or concerning to you. Not all laboratory results come back in the same time frame and the provider  may be waiting for multiple results in order to interpret others.  Please give [place] 48 hours in order for your provider to thoroughly review all the results before contacting the office for clarification of your results.   It was a pleasure to see you today!  Vito Cirigliano, D.O.

## 2023-09-26 ENCOUNTER — Ambulatory Visit: Admitting: Gastroenterology

## 2023-09-26 ENCOUNTER — Encounter: Payer: Self-pay | Admitting: Gastroenterology

## 2023-09-26 VITALS — BP 106/56 | HR 86 | Temp 98.6°F | Resp 20 | Ht 64.0 in | Wt 158.0 lb

## 2023-09-26 DIAGNOSIS — R053 Chronic cough: Secondary | ICD-10-CM | POA: Diagnosis not present

## 2023-09-26 DIAGNOSIS — Q398 Other congenital malformations of esophagus: Secondary | ICD-10-CM

## 2023-09-26 DIAGNOSIS — R1310 Dysphagia, unspecified: Secondary | ICD-10-CM

## 2023-09-26 MED ORDER — SODIUM CHLORIDE 0.9 % IV SOLN
500.0000 mL | Freq: Once | INTRAVENOUS | Status: DC
Start: 2023-09-26 — End: 2023-09-26

## 2023-09-26 NOTE — Progress Notes (Unsigned)
 GASTROENTEROLOGY PROCEDURE H&P NOTE   Primary Care Physician: Agapito Games, MD    Reason for Procedure:   Chronic cough, dysphagia  Plan:    EGD  Patient is appropriate for endoscopic procedure(s) in the ambulatory ([facility]) setting.  The nature of the procedure, as well as the risks, benefits, and alternatives were carefully and thoroughly reviewed with the patient. Ample time for discussion and questions allowed. The patient understood, was satisfied, and agreed to proceed.     HPI: Tara Howard is a 53 y.o. female who presents for EGD for evaluation of chronic cough and intermittent dysphagia .  Patient was most recently seen in the Gastroenterology Clinic on 09/25/2023 by me.  No interval change in medical history since that appointment. Please refer to that note for full details regarding GI history and clinical presentation.   Past Medical History:  Diagnosis Date   Allergy    allergist in [place]   Anxiety    Asthma    Breast cancer (HCC)    Chronic headache    COVID-19 06/01/2022   Depression    Fibromyalgia    Fibromyalgia 2006   pain / headaches   Foot trauma, right, initial encounter 01/29/2020   GERD (gastroesophageal reflux disease)    IBS (irritable bowel syndrome)    Osteoarthritis    in her hips   Vaginismus     Past Surgical History:  Procedure Laterality Date   ANKLE SURGERY Right    bilateral mastectomy  11/01/2018   BRAVO PH STUDY N/A 03/26/2019   Procedure: BRAVO PH STUDY;  Surgeon: Shellia Cleverly, DO;  Location: [facility];  Service: Gastroenterology;  Laterality: N/A;   BRAVO PH STUDY N/A 06/27/2019   Procedure: BRAVO PH STUDY;  Surgeon: Shellia Cleverly, DO;  Location: [facility];  Service: Gastroenterology;  Laterality: N/A;   BREAST LUMPECTOMY Left 09/2018   BREAST RECONSTRUCTION  12/2018   CESAREAN SECTION     X 2   ENDOMETRIAL ABLATION     ESOPHAGOGASTRODUODENOSCOPY     said it was done maybe about 10 years ago. In  [place]   ESOPHAGOGASTRODUODENOSCOPY (EGD) WITH PROPOFOL N/A 03/26/2019   Procedure: ESOPHAGOGASTRODUODENOSCOPY (EGD) WITH PROPOFOL;  Surgeon: Shellia Cleverly, DO;  Location: [facility];  Service: Gastroenterology;  Laterality: N/A;   ESOPHAGOGASTRODUODENOSCOPY (EGD) WITH PROPOFOL N/A 06/27/2019   Procedure: ESOPHAGOGASTRODUODENOSCOPY (EGD) WITH PROPOFOL;  Surgeon: Shellia Cleverly, DO;  Location: [facility];  Service: Gastroenterology;  Laterality: N/A;   TONSILLECTOMY     TONSILLECTOMY     TUBAL LIGATION     UPPER GASTROINTESTINAL ENDOSCOPY      Prior to Admission medications   Medication Sig Start Date End Date Taking? Authorizing Provider  acetaminophen (TYLENOL) 500 MG tablet Take 500 mg by mouth every 6 (six) hours as needed.   Yes [provider]  chlorpheniramine-HYDROcodone (TUSSIONEX) 10-8 MG/5ML Take 5 mLs by mouth every 12 (twelve) hours as needed for cough (cough, will cause drowsiness.). 09/04/23  Yes Tamera Punt, Erika S, DO  gabapentin (NEURONTIN) 100 MG capsule Take 2 capsules (200 mg total) by mouth 2 (two) times daily for 10 days, THEN 1 capsule (100 mg total) 2 (two) times daily for 10 days, THEN 1 capsule (100 mg total) at bedtime for 10 days. 09/08/23 10/08/23 Yes Agapito Games, MD  RABEprazole (ACIPHEX) 20 MG tablet TAKE 1 TABLET BY MOUTH TWICE DAILY(IN THE MORNING AND AT BEDTIME) 07/10/23  Yes Garnett Rekowski V, DO  traZODone (DESYREL) 100 MG  tablet TAKE 1 & 1/2 TABLETS (150MG  TOTAL) BY MOUTH AT BEDTIME 06/05/23  Yes Agapito Games, MD  venlafaxine (EFFEXOR) 37.5 MG tablet Take 1 tablet (37.5 mg total) by mouth 2 (two) times daily. 08/25/23  Yes Agapito Games, MD    Current Outpatient Medications  Medication Sig Dispense Refill   acetaminophen (TYLENOL) 500 MG tablet Take 500 mg by mouth every 6 (six) hours as needed.     chlorpheniramine-HYDROcodone (TUSSIONEX) 10-8 MG/5ML Take 5 mLs by mouth every 12 (twelve) hours as needed for  cough (cough, will cause drowsiness.). 115 mL 0   gabapentin (NEURONTIN) 100 MG capsule Take 2 capsules (200 mg total) by mouth 2 (two) times daily for 10 days, THEN 1 capsule (100 mg total) 2 (two) times daily for 10 days, THEN 1 capsule (100 mg total) at bedtime for 10 days. 70 capsule 0   RABEprazole (ACIPHEX) 20 MG tablet TAKE 1 TABLET BY MOUTH TWICE DAILY(IN THE MORNING AND AT BEDTIME) 180 tablet 3   traZODone (DESYREL) 100 MG tablet TAKE 1 & 1/2 TABLETS (150MG  TOTAL) BY MOUTH AT BEDTIME 135 tablet 0   venlafaxine (EFFEXOR) 37.5 MG tablet Take 1 tablet (37.5 mg total) by mouth 2 (two) times daily. 60 tablet 3   Current Facility-Administered Medications  Medication Dose Route Frequency Provider Last Rate Last Admin   0.9 %  sodium chloride infusion  500 mL Intravenous Once Harsh Trulock V, DO        Allergies as of 09/26/2023 - Review Complete 09/26/2023  Allergen Reaction Noted   Azithromycin Diarrhea, Nausea And Vomiting, and Other (See Comments) 02/11/2016   Zolpidem tartrate Other (See Comments) 09/28/2018   Augmentin [amoxicillin-pot clavulanate] Diarrhea, Nausea And Vomiting, and Other (See Comments) 01/22/2013   Clonidine derivatives Rash 03/20/2019   Flexeril [cyclobenzaprine] Other (See Comments) 11/29/2016   Hycodan [hydrocodone bit-homatrop mbr] Other (See Comments) 07/13/2020   Linzess [linaclotide] Other (See Comments) 11/22/2018   Other Other (See Comments) 06/22/2022   Oxybutynin Rash 03/20/2019    Family History  Problem Relation Age of Onset   Depression Mother    Hypertension Mother    Heart attack Mother    CAD Mother        3 vessel bypass   Irritable bowel syndrome Mother    Aneurysm Mother        brain   CAD Father        3 vessel bypass   Cancer Paternal Grandmother        of the bowel per patient    Bone cancer Paternal Grandmother    Colon cancer Neg Hx    Pancreatic cancer Neg Hx    Liver disease Neg Hx    Stomach cancer Neg Hx    Esophageal  cancer Neg Hx     Social History   Socioeconomic History   Marital status: Divorced    Spouse name: Not on file   Number of children: 2   Years of education: Not on file   Highest education level: Not on file  Occupational History   Occupation: Statistician: [employer]    Comment: [employer]  Tobacco Use   Smoking status: Never   Smokeless tobacco: Never  Vaping Use   Vaping status: Never Used  Substance and Sexual Activity   Alcohol use: Not Currently   Drug use: Never   Sexual activity: Not Currently    Birth control/protection: Surgical  Other Topics Concern  Not on file  Social History Narrative   Not on file   Social Drivers of Health   Financial Resource Strain: Medium Risk (05/16/2023)   Received from [facility]   Overall Financial Resource Strain (CARDIA)    Difficulty of Paying Living Expenses: Somewhat hard  Food Insecurity: Food Insecurity Present (05/16/2023)   Received from [facility]   Hunger Vital Sign    Worried About Running Out of Food in the Last Year: Sometimes true    Ran Out of Food in the Last Year: Sometimes true  Transportation Needs: No Transportation Needs (05/16/2023)   Received from [facility] - Transportation    Lack of Transportation (Medical): No    Lack of Transportation (Non-Medical): No  Physical Activity: Insufficiently Active (05/16/2023)   Received from [facility]   Exercise Vital Sign    Days of Exercise per Week: 3 days    Minutes of Exercise per Session: 20 min  Stress: Stress Concern Present (05/16/2023)   Received from [facility] - Occupational Stress Questionnaire    Feeling of Stress : To some extent  Social Connections: Moderately Integrated (05/16/2023)   Received from [facility]   Social Network    How would you rate your social network (family, work, friends)?: Adequate participation with social  networks  Intimate Partner Violence: Not At Risk (05/16/2023)   Received from [facility]   HITS    Over the last 12 months how often did your partner physically hurt you?: Never    Over the last 12 months how often did your partner insult you or talk down to you?: Never    Over the last 12 months how often did your partner threaten you with physical harm?: Never    Over the last 12 months how often did your partner scream or curse at you?: Never    Physical Exam: Vital signs in last 24 hours: @BP  (!) 146/73   Pulse 91   Temp 98.6 F (37 C)   Ht 5\' 4"  (1.626 m)   Wt 158 lb (71.7 kg)   SpO2 99%   BMI 27.12 kg/m  GEN: NAD EYE: Sclerae anicteric ENT: MMM CV: Non-tachycardic Pulm: CTA b/l GI: Soft, NT/ND NEURO:  Alert & Oriented x 3   Doristine Locks, DO [facility]   09/26/2023 10:56 AM

## 2023-09-26 NOTE — Progress Notes (Signed)
 Called to room to assist during endoscopic procedure.  Patient ID and intended procedure confirmed with present staff. Received instructions for my participation in the procedure from the performing physician.

## 2023-09-26 NOTE — Progress Notes (Signed)
 Pt's states no medical or surgical changes since previsit or office visit.

## 2023-09-26 NOTE — Progress Notes (Unsigned)
 Sedate, gd SR, tolerated procedure well, VSS, report to RN

## 2023-09-26 NOTE — Op Note (Signed)
 [facility] Patient Name: Mazel Villela Procedure Date: 09/26/2023 10:52 AM MRN: [id number] Endoscopist: Doristine Locks , MD, [id number] Age: 53 Referring MD:  Date of Birth: 11-16-1970 Gender: Female Account #: [id number] Procedure:                Upper GI endoscopy Indications:              Dysphagia, Chronic cough Medicines:                Monitored Anesthesia Care Procedure:                Pre-Anesthesia Assessment:                           - Prior to the procedure, a History and Physical                            was performed, and patient medications and                            allergies were reviewed. The patient's tolerance of                            previous anesthesia was also reviewed. The risks                            and benefits of the procedure and the sedation                            options and risks were discussed with the patient.                            All questions were answered, and informed consent                            was obtained. Prior Anticoagulants: The patient has                            taken no anticoagulant or antiplatelet agents. ASA                            Grade Assessment: II - A patient with mild systemic                            disease. After reviewing the risks and benefits,                            the patient was deemed in satisfactory condition to                            undergo the procedure.                           After obtaining informed consent, the endoscope was  passed under direct vision. Throughout the                            procedure, the patient's blood pressure, pulse, and                            oxygen saturations were monitored continuously. The                            Olympus Scope SN [id number] was introduced through the                            mouth, and advanced to the second part of duodenum.                            The upper GI  endoscopy was accomplished without                            difficulty. The patient tolerated the procedure                            well. Scope In: Scope Out: Findings:                 Two areas of ectopic gastric mucosa were found in                            the upper third of the esophagus, located 16 cm                            from the incisors.                           The examined esophagus was otherwise normal. No                            areas of luminal narrowing, stricture, erythema, or                            edema. A guidewire was placed and the scope was                            withdrawn. Dilation was performed with a Savary                            dilator with no resistance at 17 mm. The dilation                            site was examined following endoscope reinsertion                            and showed no bleeding, mucosal tear or  perforation. Biopsies were taken from the distal                            and proximal esophagus with a cold forceps for                            histology. Estimated blood loss was minimal.                           The Z-line was regular and was found 37 cm from the                            incisors.                           The entire examined stomach was normal.                           The examined duodenum was normal. Complications:            No immediate complications. Estimated Blood Loss:     Estimated blood loss was minimal. 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                           - Normal esophagus. Dilated with 17 mm Savary                            dilator then biopsied.                           - Z-line regular, 37 cm from the incisors.                           - Normal stomach.                           - Normal examined duodenum. Recommendation:           - Patient has a  contact number available for                            emergencies. The signs and symptoms of potential                            delayed complications were discussed with the                            patient. Return to normal activities tomorrow.                            Written discharge instructions were provided to the  patient.                           - Resume previous diet.                           - Continue present medications.                           - Await pathology results. Doristine Locks, MD 09/26/2023 11:23:17 AM

## 2023-09-26 NOTE — Patient Instructions (Addendum)

## 2023-09-27 ENCOUNTER — Encounter: Payer: 59 | Admitting: Gastroenterology

## 2023-09-27 ENCOUNTER — Encounter: Payer: Self-pay | Admitting: Gastroenterology

## 2023-09-27 ENCOUNTER — Telehealth: Payer: Self-pay

## 2023-09-27 NOTE — Telephone Encounter (Signed)
 Left message on answering machine.

## 2023-09-27 NOTE — Telephone Encounter (Signed)
 Patient called stated she has questions regarding the procedure she had yesterday, advised her a nurse left a voicemail earlier today but she said she did not get it.

## 2023-09-27 NOTE — Telephone Encounter (Signed)
 Phoned patient back, answered all questions and ensured her that D.Cirigliano will be in touch about the results. She verbalized understanding.

## 2023-09-28 LAB — SURGICAL PATHOLOGY

## 2023-09-29 ENCOUNTER — Encounter: Payer: Self-pay | Admitting: Gastroenterology

## 2023-09-29 DIAGNOSIS — R053 Chronic cough: Secondary | ICD-10-CM

## 2023-09-29 MED ORDER — VENLAFAXINE HCL 37.5 MG PO TABS: 37.5000 mg | ORAL_TABLET | Freq: Two times a day (BID) | ORAL | 1 refills | Status: DC

## 2023-10-03 ENCOUNTER — Encounter: Payer: Self-pay | Admitting: Family Medicine

## 2023-10-03 ENCOUNTER — Other Ambulatory Visit: Payer: Self-pay | Admitting: *Deleted

## 2023-10-03 DIAGNOSIS — M7062 Trochanteric bursitis, left hip: Secondary | ICD-10-CM

## 2023-10-03 MED ORDER — CELECOXIB 200 MG PO CAPS
ORAL_CAPSULE | ORAL | 0 refills | Status: DC
Start: 2023-10-03 — End: 2023-12-14

## 2023-10-07 ENCOUNTER — Other Ambulatory Visit: Payer: Self-pay | Admitting: Gastroenterology

## 2023-10-09 ENCOUNTER — Other Ambulatory Visit: Payer: Self-pay | Admitting: Family Medicine

## 2023-10-11 MED ORDER — GABAPENTIN 100 MG PO CAPS: ORAL_CAPSULE | ORAL | 0 refills | Status: DC

## 2023-10-11 NOTE — Telephone Encounter (Signed)
 Requesting rx rf of gabapentin 100mg  Last written 09/08/2023 Looks like this was diminishing dosing  Last OV 08/17/2023 Upcoming appt = none

## 2023-10-13 MED ORDER — RABEPRAZOLE SODIUM 20 MG PO TBEC: 20.0000 mg | DELAYED_RELEASE_TABLET | Freq: Two times a day (BID) | ORAL | 3 refills | Status: AC

## 2023-10-13 NOTE — Addendum Note (Signed)
 Addended by: Lamona Curl on: 10/13/2023 10:26 AM   Modules accepted: Orders

## 2023-10-16 ENCOUNTER — Other Ambulatory Visit: Payer: Self-pay | Admitting: Family Medicine

## 2023-10-16 DIAGNOSIS — F5101 Primary insomnia: Secondary | ICD-10-CM

## 2023-10-16 MED ORDER — TRAZODONE HCL 100 MG PO TABS: 100.0000 mg | ORAL_TABLET | Freq: Every evening | ORAL | 0 refills | Status: DC | PRN

## 2023-10-16 NOTE — Telephone Encounter (Signed)
 Copied from CRM [id number]. Topic: Clinical - Medication Refill >> Oct 16, 2023  9:10 AM Nila Nephew wrote: Most Recent Primary Care Visit:  Provider: Charlton Amor  Department: [facility]  Visit Type: ACUTE  Date: 09/04/2023  Medication: traZODone (DESYREL) 100 MG tablet  Has the patient contacted their pharmacy? No  Is this the correct pharmacy for this prescription? Yes  This is the patient's preferred pharmacy:  [facility] [phone] - [place], [place] - [address] AT [facility] [address] [facility] [address] [place] [address] Phone: [phone] Fax: [phone]   Has the prescription been filled recently? No  Is the patient out of the medication? Yes  Has the patient been seen for an appointment in the last year OR does the patient have an upcoming appointment? Yes  Can we respond through MyChart? No - Phone Calls  Agent: Please be advised that Rx refills may take up to 3 business days. We ask that you follow-up with your pharmacy.

## 2023-10-16 NOTE — Telephone Encounter (Signed)
 Requesting rx rf of Trazodone 100mg   Last written 06/05/2023 Last OV 08/17/2023 - discussed tapering off of medication Upcoming appt = none

## 2023-11-07 ENCOUNTER — Other Ambulatory Visit: Payer: Self-pay | Admitting: Family Medicine

## 2023-11-07 NOTE — Telephone Encounter (Signed)
 Requesting rx rf of gabapentin 100mg  cap by pharmacy  This appears to be a taper Last written 10/11/2023 Last OV 08/17/2023 Upcoming appt =none

## 2023-11-07 NOTE — Telephone Encounter (Signed)
 Please call and clarify with patient.  I just 1 to make sure that this is not an auto refill from the pharmacy and that she really does not want refilled.  And then see if she wants it refilled as the taper or if she is staying on the maintenance dose.  Also see where she had her last mammo if you do not mind

## 2023-12-04 ENCOUNTER — Ambulatory Visit (INDEPENDENT_AMBULATORY_CARE_PROVIDER_SITE_OTHER): Payer: Self-pay | Admitting: Pulmonary Disease

## 2023-12-04 ENCOUNTER — Encounter: Payer: Self-pay | Admitting: Pulmonary Disease

## 2023-12-04 VITALS — BP 115/78 | HR 61 | Ht 64.0 in | Wt 147.0 lb

## 2023-12-04 DIAGNOSIS — R053 Chronic cough: Secondary | ICD-10-CM

## 2023-12-04 NOTE — Progress Notes (Unsigned)
 @Patient  ID: Tara Howard, female    DOB: [birth year], 53 y.o.   MRN: [id number]  Chief Complaint  Patient presents with   Consult    Referring provider: Annis Kinder, DO  HPI:   PMH:  Smoker/ Smoking History:  Maintenance:   Pt of:   12/04/2023  - Visit     Questionaires / Pulmonary Flowsheets:   ACT:  Asthma Control Test ACT Total Score  04/12/2023  4:00 PM 19  03/15/2023 11:00 AM 16    MMRC:     No data to display          Epworth:      No data to display          Tests:   FENO:  No results found for: "NITRICOXIDE"  PFT:     No data to display          WALK:      No data to display          Imaging: No results found.  Lab Results:  CBC    Component Value Date/Time   WBC 4.5 03/15/2023 1145   WBC 5.3 10/09/2014 0923   RBC 4.13 03/15/2023 1145   RBC 4.43 10/09/2014 0923   HGB 12.7 03/15/2023 1145   HCT 38.5 03/15/2023 1145   PLT 222 03/15/2023 1145   MCV 93 03/15/2023 1145   MCH 30.8 03/15/2023 1145   MCH 31.2 10/09/2014 0923   MCHC 33.0 03/15/2023 1145   MCHC 33.1 10/09/2014 0923   RDW 12.7 03/15/2023 1145   LYMPHSABS 2.0 03/15/2023 1145   MONOABS 0.6 09/02/2011 1328   EOSABS 0.1 03/15/2023 1145   BASOSABS 0.0 03/15/2023 1145    BMET    Component Value Date/Time   NA 140 08/08/2016 0000   NA 140 08/08/2016 0000   K 4.3 08/08/2016 0000   K 4.3 08/08/2016 0000   CL 101 10/09/2014 0923   CO2 27 10/09/2014 0923   GLUCOSE 84 10/09/2014 0923   BUN 12 08/08/2016 0000   BUN 18 08/08/2016 0000   CREATININE 0.7 08/08/2016 0000   CREATININE 0.7 08/08/2016 0000   CREATININE 0.81 10/09/2014 0923   CALCIUM 9.2 10/09/2014 0923   GFRNONAA >89 10/09/2014 0923   GFRAA >89 10/09/2014 0923    BNP No results found for: "BNP"  ProBNP No results found for: "PROBNP"  Specialty Problems       Pulmonary Problems   Subacute cough   Upper airway cough syndrome   Asthma and allergies have been ruled out shows  she is completely off of her inhalers and allergy  meds.      Viral URI    Allergies  Allergen Reactions   Azithromycin Diarrhea, Nausea And Vomiting and Other (See Comments)   Zolpidem Tartrate Other (See Comments)    Other Reaction(s): Other (See Comments)    Hallucinations    Other reaction(s): Other (See Comments) Hallucinations   Augmentin  [Amoxicillin -Pot Clavulanate] Diarrhea, Nausea And Vomiting and Other (See Comments)    Stomach cramps Did it involve swelling of the face/tongue/throat, SOB, or low BP? No Did it involve sudden or severe rash/hives, skin peeling, or any reaction on the inside of your mouth or nose? No Did you need to seek medical attention at a hospital or doctor's office? No When did it last happen? More than 3 years ago    If all above answers are "NO", may proceed with cephalosporin use.    Clonidine Derivatives Rash   Flexeril  [  Cyclobenzaprine ] Other (See Comments)    constipation   Hycodan [Hydrocodone  Bit-Homatrop Mbr] Other (See Comments)    Nausea   Linzess  [Linaclotide ] Other (See Comments)    cramping   Other Other (See Comments)   Oxybutynin  Rash    Immunization History  Administered Date(s) Administered   Influenza Split 05/25/2012   Influenza Whole 04/12/2010   Influenza,inj,Quad PF,6+ Mos 03/25/2016, 03/14/2017, 03/16/2018, 04/14/2020, 04/27/2021, 04/12/2022   Influenza,inj,quad, With Preservative 04/18/2019   Influenza-Unspecified 04/25/2015   PFIZER(Purple Top)SARS-COV-2 Vaccination 09/23/2019, 10/22/2019   PPD Test 08/29/2011, 10/09/2014, 10/09/2014, 02/11/2016   Tdap 07/29/2011    Past Medical History:  Diagnosis Date   Allergy     allergist in [place]   Anxiety    Asthma    Breast cancer (HCC)    Chronic headache    COVID-19 06/01/2022   Depression    Fibromyalgia    Fibromyalgia 2006   pain / headaches   Foot trauma, right, initial encounter 01/29/2020   GERD (gastroesophageal reflux disease)    IBS (irritable bowel  syndrome)    Osteoarthritis    in her hips   Vaginismus     Tobacco History: Social History   Tobacco Use  Smoking Status Never  Smokeless Tobacco Never   Counseling given: Not Answered   Continue to not smoke  Outpatient Encounter Medications as of 12/04/2023  Medication Sig   traZODone  (DESYREL ) 100 MG tablet Take 1 tablet (100 mg total) by mouth at bedtime as needed for sleep.   venlafaxine  (EFFEXOR ) 37.5 MG tablet Take 1 tablet (37.5 mg total) by mouth 2 (two) times daily.   acetaminophen  (TYLENOL ) 500 MG tablet Take 500 mg by mouth every 6 (six) hours as needed. (Patient not taking: Reported on 12/04/2023)   celecoxib  (CELEBREX ) 200 MG capsule TAKE 1-2 TABLETS BY MOUTH DAILY AS NEEDED FOR PAIN. (Patient not taking: Reported on 12/04/2023)   chlorpheniramine-HYDROcodone  (TUSSIONEX) 10-8 MG/5ML Take 5 mLs by mouth every 12 (twelve) hours as needed for cough (cough, will cause drowsiness.). (Patient not taking: Reported on 12/04/2023)   gabapentin  (NEURONTIN ) 100 MG capsule Take 2 capsules (200 mg total) by mouth 2 (two) times daily for 10 days, THEN 1 capsule (100 mg total) 2 (two) times daily for 10 days, THEN 1 capsule (100 mg total) at bedtime for 10 days.   RABEprazole  (ACIPHEX ) 20 MG tablet Take 1 tablet (20 mg total) by mouth in the morning and at bedtime. (Patient not taking: Reported on 12/04/2023)   No facility-administered encounter medications on file as of 12/04/2023.     Review of Systems  Review of Systems   Physical Exam  BP 115/78 (BP Location: Right Arm, Patient Position: Sitting, Cuff Size: Normal)   Pulse 61   Ht 5\' 4"  (1.626 m)   Wt 147 lb (66.7 kg)   SpO2 100%   BMI 25.23 kg/m   Wt Readings from Last 5 Encounters:  12/04/23 147 lb (66.7 kg)  09/26/23 158 lb (71.7 kg)  09/25/23 158 lb (71.7 kg)  08/17/23 157 lb (71.2 kg)  08/03/23 162 lb (73.5 kg)    BMI Readings from Last 5 Encounters:  12/04/23 25.23 kg/m  09/26/23 27.12 kg/m  09/25/23  27.12 kg/m  08/17/23 30.66 kg/m  08/03/23 31.64 kg/m     Physical Exam General: Sitting in chair in no acute distress, frequent throat clearing Eyes: EOMI, no icterus Neck: Supple, no JVP    Assessment & Plan:   Chronic cough: Extensive GI history with questionable GERD  GERD and multiple PPIs other medications tried.  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  Her chest imaging is clear most recently 09/14/2023 and 09/13/2022.  Other possible contributors include postnasal drip and asthma for which she follows with allergy  and immunology.  Reportedly has failed or did not improve with aggressive inhalers.  She does not wish to pursue inhalers again frankly given lack of response I think this is reasonable at this time.  Historically her eosinophil count was not significantly elevated, and her allergen panel in 2024, IgE levels were nondetectable.  Including molds etc.  She is concerned about mold exposure at home.  This could be causing cough.  I recommended having evaluation for mold.  Presently to be remediated which is quite expensive, at times because prohibitive.  Her lungs are clear on exam.  She has no dyspnea.  Chest x-ray 2024 2025 are clear as above.  No signs of hyperinflation etc. which can be subtle signs of asthma or small airways disease.  Most likely this is not coming from the lung. Referred to laryngologist at [facility] system.  Seen ENT in the past via [place] ear nose and throat but no fiberoptic exam was performed.  Offered CT scan of the chest which is likely low yield but could be beneficial in terms of reassurance.  Given cost issues at this time we have deferred this, she is to contact me when this is feasible.  We can arrange follow-up based on results.   Return if symptoms worsen or fail to improve, for f/u Dr. Marygrace Snellen, after CT scan.   Guerry Leek, MD 12/04/2023   This appointment required 61  minutes of patient care (this includes precharting, chart review, review of results, face-to-face care, etc.).

## 2023-12-04 NOTE — Patient Instructions (Signed)
 I sent a referral to the laryngologist at [facility] me when you are ready for the CT scan I will order this and we will have follow-up afterwards  Return to clinic as needed

## 2023-12-12 ENCOUNTER — Ambulatory Visit: Payer: Self-pay

## 2023-12-12 NOTE — Telephone Encounter (Signed)
 Patient scheduled for 12/14/2023 with Cherre Cornish, NP

## 2023-12-12 NOTE — Telephone Encounter (Signed)
  Chief Complaint: hair loss, right wrist pain Symptoms: see triage note Frequency: both, ongoing Pertinent Negatives: Patient denies  Disposition: [] ED /[] Urgent Care (no appt availability in office) / [] Appointment(In office/virtual)/ []  [facility]/ [] Home Care/ [] Refused Recommended Disposition /[]  Mobile Bus/ [x]  Follow-up with PCP Additional Notes: Would like evaluation for hair loss and carpal tunel symptoms.  taking supplements like biotin for hair loss, would like to be evaluated for deficiencies. Increased hair loss over the last month.  Carpal Tunel-When she gains weight, she gets carpal tunel, but that it normally goes away when she loses weight. Advised by oncologist not to take any OTC meds Copied from CRM [id number]. Topic: Clinical - Red Word Triage >> Dec 12, 2023  8:55 AM Retta Caster wrote: Red Word that prompted transfer to Nurse Triage: Patient  carpal tunnel RT hand/numbeness/Tiggling having trouble doing daily things /Loss of Hair Reason for Disposition  [1] Normal hair loss from aging suspected BUT [2] requesting to talk with doctor (treatments, hair transplants)  [1] Weakness or numbness in hand or fingers AND [2] present > 2 weeks  Answer Assessment - Initial Assessment Questions 1. LOCATION: "Where is the hair loss?" (e.g., all of scalp, parts of scalp, back of head or neck)     No thin spots in particular, less hair for pony tail 2. DESCRIPTION: "Please describe it.? (e.g., thinning of hair, balding, patches of hair missing)     Handfulls of hair come out when washing 3. ONSET: "When did the hair loss begin?" (e.g., sudden or gradual onset; days, weeks, months or years ago)     Over the past one to two years 4. OTHER SYMPTOMS: "What does the scalp look like where the hair is missing?" (e.g., normal, redness, crusts, scarring)     N/A 5. OTHER FACTORS: "Have you had any of the following recently: childbirth, severe illness or injury, major surgery,  major weight loss, cancer chemo, tight hair braids, serious stress?"     Weight loss- 16 pound weight loss over last five months 6. CAUSE: "What do you think is causing the hair loss?"     Possible peri menopause/stres  Answer Assessment - Initial Assessment Questions 1. ONSET: "When did the pain start?"     A few months 2. LOCATION: "Where is the pain located?"     Right wrist 3. PAIN: "How bad is the pain?" (Scale 1-10; or mild, moderate, severe)   - MILD (1-3): doesn't interfere with normal activities   - MODERATE (4-7): interferes with normal activities (e.g., work or school) or awakens from sleep   - SEVERE (8-10): excruciating pain, unable to use hand at all     7/10 out of brace, more manageable in brace 4. WORK OR EXERCISE: "Has there been any recent work or exercise that involved this part (i.e., hand or wrist) of the body?"     Bothers her but she still works 5. CAUSE: "What do you think is causing the pain?"     Carpal tunel, un diagnosed 6. AGGRAVATING FACTORS: "What makes the pain worse?" (e.g., using computer)     Being out of brace, activity 7. OTHER SYMPTOMS: "Do you have any other symptoms?" (e.g., neck pain, swelling, rash, numbness, fever)     no  Protocols used: Hair Loss-A-AH, Hand and Wrist Pain-A-AH

## 2023-12-14 ENCOUNTER — Ambulatory Visit (INDEPENDENT_AMBULATORY_CARE_PROVIDER_SITE_OTHER): Payer: Self-pay | Admitting: Medical-Surgical

## 2023-12-14 ENCOUNTER — Encounter: Payer: Self-pay | Admitting: Medical-Surgical

## 2023-12-14 VITALS — BP 108/67 | HR 92 | Resp 98 | Ht 64.0 in | Wt 150.1 lb

## 2023-12-14 DIAGNOSIS — L659 Nonscarring hair loss, unspecified: Secondary | ICD-10-CM

## 2023-12-14 DIAGNOSIS — G5601 Carpal tunnel syndrome, right upper limb: Secondary | ICD-10-CM

## 2023-12-14 NOTE — Progress Notes (Signed)
        Established patient visit  History, exam, impression, and plan:  1. Hair loss (Primary) Pleasant 53 year old female presenting today with concerns over hair loss. Reports several years of hair loss but has been getting worse over the last 5 months. Now having clumps of hair fall out in the shower and when brushing it. In January, she made diet changes and switched to a whole foods diet. She has tried multiple options for hair growth including adding rosemary essential oils to shampoos and adding biotiin, cortisol, glutathione, and magnesium  supplements. Nothing has helped so far. No recent illnesses, drastic weight loss, new medications, or new cosmetics/hair care products. Unclear etiology. Consider possible relation to hormone changes or dietary changes. Consider metabolic cause. Checking labs as below. Discussed the option of topical minoxidil or oral finasteride but will hold off on this for now.  - CBC with Differential/Platelet - Iron, TIBC and Ferritin Panel - TSH - CMP14+EGFR - Heavy Metals Profile II, Blood - Vitamin B12 - Vitamin B6 - Cortisol - VITAMIN D  25 Hydroxy (Vit-D Deficiency, Fractures)  2. Right carpal tunnel syndrome Notes an issue with carpal tunnel during her pregnancy but this resolved after delivery. For the last 3 months, has begun to experience numbness, tingling, and pain in her right hand and wrist. She has returned to bracing at night which helps some but during the day, the symptoms return and are now interfering with her ability to do necessary activities. Doing home PT exercises daily.  Not taking anti-inflammatories due to current GI concerns. Discussed oral vs injection steroids for her symptoms. She would like to pursue injections and forego the oral steroids due to side effects. Recommend follow up with Dr. Elva Hamburger for further discussion of her symptoms and potential hydrodisection/injection. For now, continue bracing and doing home PT exercises. Consider  Aleve 1 tablet twice daily if this does not interfere with her GI issues.   Procedures performed this visit: None.  Return for right carpal tunnel follow up with Dr. Elva Hamburger at your convenience.  __________________________________ Tara Snook, DNP, APRN, FNP-BC Primary Care and Sports Medicine [facility]

## 2023-12-16 ENCOUNTER — Ambulatory Visit: Payer: Self-pay | Admitting: Medical-Surgical

## 2023-12-19 ENCOUNTER — Other Ambulatory Visit: Payer: Self-pay | Admitting: Gastroenterology

## 2023-12-20 ENCOUNTER — Ambulatory Visit: Payer: BC Managed Care – PPO | Admitting: Internal Medicine

## 2023-12-21 ENCOUNTER — Other Ambulatory Visit (INDEPENDENT_AMBULATORY_CARE_PROVIDER_SITE_OTHER): Payer: Self-pay

## 2023-12-21 ENCOUNTER — Ambulatory Visit (INDEPENDENT_AMBULATORY_CARE_PROVIDER_SITE_OTHER): Payer: Self-pay | Admitting: Sports Medicine

## 2023-12-21 DIAGNOSIS — G5601 Carpal tunnel syndrome, right upper limb: Secondary | ICD-10-CM | POA: Insufficient documentation

## 2023-12-21 DIAGNOSIS — M2550 Pain in unspecified joint: Secondary | ICD-10-CM

## 2023-12-21 MED ORDER — TRIAMCINOLONE ACETONIDE 40 MG/ML IJ SUSP
40.0000 mg | Freq: Once | INTRAMUSCULAR | Status: AC
  Administered 2023-12-21: 40 mg via INTRAMUSCULAR

## 2023-12-21 NOTE — Assessment & Plan Note (Signed)
 Widespread aches and pains, wonders if this may be menopause, is using multiple supplements with minimal efficacy. She is hoping to avoid any oral medications or neuropathic agents. She is on venlafaxine  75 mg without much improvement. We did discuss the treatment protocol for widespread aches and pains. For now we will leave things alone, she will follow the [employer] of sports medicine recommendations for exercise, she will try more of an anti-inflammatory diet for now, but understands that I do not want her to rule out modalities that might be really beneficial for her such as neuropathic medication.

## 2023-12-21 NOTE — Progress Notes (Signed)
    Procedures performed today:    Procedure: Real-time Ultrasound Guided hydrodissection of the right median nerve at the carpal tunnel Device: Samsung HS60 Verbal informed consent obtained.  Time-out conducted.  Noted no overlying erythema, induration, or other signs of local infection.  Skin prepped in a sterile fashion.  Local anesthesia: Topical Ethyl chloride.  With sterile technique and under real time ultrasound guidance: I noted an enlarged nerve. Using a 25-gauge needle advanced into the carpal tunnel, taking care to avoid intraneural injection I injected medication both superficial to and deep to the median nerve freeing it from surrounding structures, I then redirected the needle deep and injected further medication around the flexor tendons deep within the carpal tunnel for a total of 1 cc kenalog  40, 5 cc 1% lidocaine  without epinephrine. Completed without difficulty  Advised to call if fevers/chills, erythema, induration, drainage, or persistent bleeding.  Images permanently stored and available for review in PACS.  Impression: Technically successful ultrasound guided median nerve hydrodissection.  Independent interpretation of notes and tests performed by another provider:   None.  Brief History, Exam, Impression, and Recommendations:    Carpal tunnel syndrome on right This is a very pleasant 53 year old female, she has known cervical degenerative disc disease with severe spinal stenosis, more recently she has been having increasing discomfort right hand and wrist throughout the day and night, she gets numbness and tingling 1st through 4th fingers. Nothing proximal to the wrist. On exam she does have a positive Tinel sign, negative Phalen sign, negative Spurling sign. She is in significant discomfort so we did a median nerve hydrodissection/injection today. Home physical therapy given. She is hoping to avoid oral pharmacologic agents. She understands that if this does  not help we would likely proceed with nerve conduction and EMG to determine if this is more of a radicular process.  Polyarthralgia Widespread aches and pains, wonders if this may be menopause, is using multiple supplements with minimal efficacy. She is hoping to avoid any oral medications or neuropathic agents. She is on venlafaxine  75 mg without much improvement. We did discuss the treatment protocol for widespread aches and pains. For now we will leave things alone, she will follow the [employer] of sports medicine recommendations for exercise, she will try more of an anti-inflammatory diet for now, but understands that I do not want her to rule out modalities that might be really beneficial for her such as neuropathic medication.    ____________________________________________ Joselyn Nicely. Sandy Crumb, M.D., ABFM., CAQSM., AME. Primary Care and Sports Medicine [facility]  Adjunct Professor of [facility] Medicine  [employer]  Restaurant manager, fast food

## 2023-12-21 NOTE — Assessment & Plan Note (Signed)
 This is a very pleasant 53 year old female, she has known cervical degenerative disc disease with severe spinal stenosis, more recently she has been having increasing discomfort right hand and wrist throughout the day and night, she gets numbness and tingling 1st through 4th fingers. Nothing proximal to the wrist. On exam she does have a positive Tinel sign, negative Phalen sign, negative Spurling sign. She is in significant discomfort so we did a median nerve hydrodissection/injection today. Home physical therapy given. She is hoping to avoid oral pharmacologic agents. She understands that if this does not help we would likely proceed with nerve conduction and EMG to determine if this is more of a radicular process.

## 2023-12-23 ENCOUNTER — Other Ambulatory Visit: Payer: Self-pay | Admitting: Family Medicine

## 2023-12-23 DIAGNOSIS — F5101 Primary insomnia: Secondary | ICD-10-CM

## 2023-12-23 LAB — CMP14+EGFR
ALT: 17 IU/L (ref 0–32)
AST: 21 IU/L (ref 0–40)
Albumin: 3.7 g/dL — ABNORMAL LOW (ref 3.8–4.9)
Alkaline Phosphatase: 117 IU/L (ref 44–121)
BUN/Creatinine Ratio: 17 (ref 9–23)
BUN: 15 mg/dL (ref 6–24)
Bilirubin Total: <0.2 mg/dL (ref 0.0–1.2)
CO2: 25 mmol/L (ref 20–29)
Calcium: 9.2 mg/dL (ref 8.7–10.2)
Chloride: 103 mmol/L (ref 96–106)
Creatinine, Ser: 0.88 mg/dL (ref 0.57–1.00)
Globulin, Total: 2.3 g/dL (ref 1.5–4.5)
Glucose: 105 mg/dL — ABNORMAL HIGH (ref 70–99)
Potassium: 4.3 mmol/L (ref 3.5–5.2)
Sodium: 141 mmol/L (ref 134–144)
Total Protein: 6 g/dL (ref 6.0–8.5)
eGFR: 80 mL/min/{1.73_m2} (ref >59)

## 2023-12-23 LAB — VITAMIN B12: Vitamin B-12: 408 pg/mL (ref 232–1245)

## 2023-12-23 LAB — CBC WITH DIFFERENTIAL/PLATELET
Basophils Absolute: 0.1 10*3/uL (ref 0.0–0.2)
Basos: 1 %
EOS (ABSOLUTE): 0.2 10*3/uL (ref 0.0–0.4)
Eos: 5 %
Hematocrit: 37.8 % (ref 34.0–46.6)
Hemoglobin: 12.6 g/dL (ref 11.1–15.9)
Immature Grans (Abs): 0 10*3/uL (ref 0.0–0.1)
Immature Granulocytes: 0 %
Lymphocytes Absolute: 1.9 10*3/uL (ref 0.7–3.1)
Lymphs: 38 %
MCH: 31.4 pg (ref 26.6–33.0)
MCHC: 33.3 g/dL (ref 31.5–35.7)
MCV: 94 fL (ref 79–97)
Monocytes Absolute: 0.5 10*3/uL (ref 0.1–0.9)
Monocytes: 10 %
Neutrophils Absolute: 2.3 10*3/uL (ref 1.4–7.0)
Neutrophils: 46 %
Platelets: 200 10*3/uL (ref 150–450)
RBC: 4.01 x10E6/uL (ref 3.77–5.28)
RDW: 12.5 % (ref 11.7–15.4)
WBC: 4.9 10*3/uL (ref 3.4–10.8)

## 2023-12-23 LAB — IRON,TIBC AND FERRITIN PANEL
Ferritin: 38 ng/mL (ref 15–150)
Iron Saturation: 11 % — ABNORMAL LOW (ref 15–55)
Iron: 32 ug/dL (ref 27–159)
Total Iron Binding Capacity: 291 ug/dL (ref 250–450)
UIBC: 259 ug/dL (ref 131–425)

## 2023-12-23 LAB — HEAVY METALS PROFILE II, BLOOD
Arsenic: 1 ug/L (ref 0–9)
Cadmium: <0.5 ug/L (ref 0.0–1.2)
Lead, Blood: <1 ug/dL (ref 0.0–3.4)
Mercury: 1.5 ug/L (ref 0.0–14.9)

## 2023-12-23 LAB — VITAMIN B6: Vitamin B6: 60.1 ug/L (ref 3.4–65.2)

## 2023-12-23 LAB — CORTISOL: Cortisol: 3.5 ug/dL — ABNORMAL LOW (ref 6.2–19.4)

## 2023-12-23 LAB — VITAMIN D 25 HYDROXY (VIT D DEFICIENCY, FRACTURES): Vit D, 25-Hydroxy: 94.4 ng/mL (ref 30.0–100.0)

## 2023-12-23 LAB — TSH: TSH: 1.62 u[IU]/mL (ref 0.450–4.500)

## 2023-12-25 ENCOUNTER — Other Ambulatory Visit: Payer: Self-pay | Admitting: Family Medicine

## 2023-12-25 DIAGNOSIS — F5101 Primary insomnia: Secondary | ICD-10-CM

## 2023-12-25 NOTE — Telephone Encounter (Signed)
 Medication already sent to the pharmacy

## 2023-12-25 NOTE — Telephone Encounter (Signed)
 Copied from CRM [id number]. Topic: Clinical - Medication Refill >> Dec 25, 2023 10:21 AM Blair Bumpers wrote: Medication: traZODone  (DESYREL ) 100 MG tablet  Has the patient contacted their pharmacy? Yes & pharmacy stated they never heard back.  (Agent: If no, request that the patient contact the pharmacy for the refill. If patient does not wish to contact the pharmacy document the reason why and proceed with request.) (Agent: If yes, when and what did the pharmacy advise?)  This is the patient's preferred pharmacy:  [facility] [phone] - [place], [place] - [address] AT [facility] [address] [facility] [address] [place] [address] Phone: [phone] Fax: [phone]  Is this the correct pharmacy for this prescription? Yes If no, delete pharmacy and type the correct one.   Has the prescription been filled recently? Yes  Is the patient out of the medication? Yes  Has the patient been seen for an appointment in the last year OR does the patient have an upcoming appointment? Yes  Can we respond through MyChart? Yes  Agent: Please be advised that Rx refills may take up to 3 business days. We ask that you follow-up with your pharmacy.

## 2024-01-01 ENCOUNTER — Encounter: Payer: Self-pay | Admitting: Pulmonary Disease

## 2024-01-08 ENCOUNTER — Encounter: Payer: Self-pay | Admitting: Family Medicine

## 2024-01-08 ENCOUNTER — Ambulatory Visit: Payer: Self-pay

## 2024-01-08 ENCOUNTER — Ambulatory Visit (INDEPENDENT_AMBULATORY_CARE_PROVIDER_SITE_OTHER): Payer: Self-pay | Admitting: Family Medicine

## 2024-01-08 VITALS — BP 109/77 | HR 88 | Ht 64.0 in | Wt 143.0 lb

## 2024-01-08 DIAGNOSIS — T63481A Toxic effect of venom of other arthropod, accidental (unintentional), initial encounter: Secondary | ICD-10-CM | POA: Insufficient documentation

## 2024-01-08 DIAGNOSIS — Z91030 Bee allergy status: Secondary | ICD-10-CM

## 2024-01-08 MED ORDER — HYDROXYZINE HCL 25 MG PO TABS
25.0000 mg | ORAL_TABLET | Freq: Three times a day (TID) | ORAL | 0 refills | Status: DC | PRN
Start: 2024-01-08 — End: 2024-01-30

## 2024-01-08 MED ORDER — METHYLPREDNISOLONE ACETATE 80 MG/ML IJ SUSP: 80.0000 mg | Freq: Once | INTRAMUSCULAR | Status: AC

## 2024-01-08 NOTE — Patient Instructions (Signed)
Bee, Wasp, or [employer], Adult Bees, wasps, and hornets are part of a family of insects that sting. Normally, a sting will cause pain, redness, and swelling at the sting site. However, some people have an allergy to these stings, and their reactions can be much more serious. What increases the risk? You may be at a greater risk of getting stung if you: Provoke a stinging insect by swatting or disturbing it. Wear strong-smelling soaps, deodorants, or body sprays. Spend time outdoors near gardens with flowers or fruit trees or in clothes that expose skin. Eat or drink outside. What are the signs or symptoms? The reaction to an insect sting can vary from a mild, normal response to life-threatening anaphylaxis. The sting site is often a red lump in the skin, sometimes with a tiny hole in the center, that may still have the stinger in the center of the wound. Normal reaction A normal reaction is experienced by most people after an insect sting. Symptoms include: Pain, redness, and swelling at the sting site. These can develop over 24-48 hours. Pain, redness, and swelling that may spread to a larger, connected area beyond the sting site. The spreading can continue over 24-48 hours. Allergic reaction An allergic reaction can vary in severity and includes symptoms in other areas of the body beyond the sting site. People who experience an allergic reaction have a higher risk of having similar or worse symptoms the next time they are stung. Symptoms may include: Hives, itching, and swelling. Abdominal symptoms including cramping, nausea, vomiting, and diarrhea. Severe symptoms that require immediate medical attention include: Chest pain or tightness. Wheezing or trouble breathing. Swelling of the tongue, throat, or lips. Trouble swallowing or hoarse voice. Anaphylactic reaction An anaphylactic reaction is a severe, life-threatening allergy and requires immediate medical attention. The symptoms often  include severe allergic reaction symptoms that develop rapidly and lead to: A sudden and sharp drop in blood pressure. Dizziness. Loss of consciousness. How is this diagnosed? This condition is usually diagnosed based on your symptoms and medical history as well as a physical exam. You may have an allergy test to determine if you are allergic to the insect venom. How is this treated? If you were stung by a bee, the stinger and a small sac of venom may be in the wound. Remove the stinger as soon as possible. Do this by brushing across the wound with gauze, a clean fingernail, or a flat card such as a credit card. This can help reduce the severity of your body's reaction to the sting. Normal reactions can be treated with: Washing the area thoroughly with soap and water. Applying ice to the area to reduce swelling. Oral or topical medicines to help reduce pain and itching, if present. Pay close attention to your symptoms after you have been stung. If possible, have someone stay with you to see if an allergic reaction develops. If allergic symptoms develop, oral antihistamines can be taken and you will need medical help right away. If you had an allergic reaction before, you may need to: Use an auto-injector "pen"(pre-filled automatic epinephrine injection device)at the first sign of an allergic reaction. Get medical help right away because the epinephrine is short-acting. It is intended to give you more time to get to an emergency room. Follow these instructions at home:  Wash the sting site 2-3 times a day with soap and water as told by your health care provider. Apply or take over-the-counter and prescription medicines only as told  by your health care provider. If directed, apply ice to the sting area. Put ice in a plastic bag. Place a towel between your skin and the bag. Leave the ice on for 20 minutes, 2-3 times a day. Do not scratch the sting area. If you had a severe allergic reaction to  a sting, you may need to: Wear a medical bracelet or necklace that lists the allergy. Carry an anaphylaxis kit or an epinephrine auto-injector "pen" with you at all times. Tell your family members, friends, and coworkers when and how to use it. Use it at the first sign of an allergic reaction. How is this prevented? Avoid swatting at stinging insects and disturbing insect nests. Do not use fragrant soaps or lotions and avoid sitting near flowering plants, if possible. Wear shoes, pants, and long sleeves when spending time outdoors, especially in grassy areas where stinging insects are common. Keep outdoor areas free from nests or hives. Keep food and drink containers covered when eating outdoors. Wear gloves if you are gardening or working outdoors. Find a barrier between you and the insect(s), such as a door, if an attack by a stinging insect or a swarm seems likely. Contact a health care provider if: Your symptoms do not get better in 2-3 days. You have redness, swelling, or pain that spreads beyond the area of the sting. You have a fever. Get help right away if: You have symptoms of a severe allergic reaction. These include: Chest tightness or pain. Wheezing, or trouble swallowing or breathing. Light-headedness, dizziness, or fainting. Itchy, raised, red patches on the skin beyond the sting site. Abdominal cramping, nausea, vomiting, or diarrhea. Trouble swallowing or a swollen tongue, throat, or lips. These symptoms may be an emergency. Get help right away. Call 911. Do not wait to see if the symptoms will go away. Do not drive yourself to the hospital. Summary Stings from bees, wasps, and hornets can cause pain and swelling, but they are usually not serious. However, some people may have an allergic reaction to a sting. This can cause the symptoms to be more severe. Pay close attention to your symptoms after you have been stung. If possible, have someone stay with you to look for  signs of worsening symptoms. Call your health care provider if you have any signs of an allergic reaction. This information is not intended to replace advice given to you by your health care provider. Make sure you discuss any questions you have with your health care provider. Document Revised: 09/07/2021 Document Reviewed: 09/07/2021 Elsevier Patient Education  2024 [employer].

## 2024-01-08 NOTE — Telephone Encounter (Signed)
 Can we see what is going on with referral to ENT via [facility]? This was placed at time of last visit. Thanks!

## 2024-01-08 NOTE — Telephone Encounter (Signed)
 This encounter was created in error - please disregard.

## 2024-01-08 NOTE — Progress Notes (Signed)
 Tara Howard - 53 y.o. female MRN [id number]  Date of birth: 1970/12/01  Subjective Chief Complaint  Patient presents with   Insect Bite    HPI Tara Howard is a 53 year old female here today with complaint of of bee sting.  Bee sting located on the right ankle.  This occurred 2 days prior.  She does have multiple stings from yellowjacket.  She has tried triamcinolone  cream, Benadryl and ice without significant improvement.  She continues to have pain with weightbearing and wearing socks and shoes.  She does have quite a bit of itching as well.  ROS:  A comprehensive ROS was completed and negative except as noted per HPI  Allergies  Allergen Reactions   Azithromycin Diarrhea, Nausea And Vomiting and Other (See Comments)   Zolpidem Tartrate Other (See Comments)    Other Reaction(s): Other (See Comments)  Hallucinations  Other reaction(s): Other (See Comments) Hallucinations   Augmentin  [Amoxicillin -Pot Clavulanate] Diarrhea, Nausea And Vomiting and Other (See Comments)    Stomach cramps Did it involve swelling of the face/tongue/throat, SOB, or low BP? No Did it involve sudden or severe rash/hives, skin peeling, or any reaction on the inside of your mouth or nose? No Did you need to seek medical attention at a hospital or doctor's office? No When did it last happen? More than 3 years ago    If all above answers are NO, may proceed with cephalosporin use.    Clonidine Derivatives Rash   Flexeril  [Cyclobenzaprine ] Other (See Comments)    constipation   Hycodan [Hydrocodone  Bit-Homatrop Mbr] Other (See Comments)    Nausea   Linzess  [Linaclotide ] Other (See Comments)    cramping   Other Other (See Comments)   Oxybutynin  Rash    Past Medical History:  Diagnosis Date   Allergy     allergist in [place]   Anxiety    Asthma    Breast cancer (HCC)    Chronic headache    COVID-19 06/01/2022   Depression    Fibromyalgia    Fibromyalgia 2006   pain / headaches   Foot trauma,  right, initial encounter 01/29/2020   GERD (gastroesophageal reflux disease)    IBS (irritable bowel syndrome)    Osteoarthritis    in her hips   Vaginismus     Past Surgical History:  Procedure Laterality Date   ANKLE SURGERY Right    bilateral mastectomy  11/01/2018   BRAVO PH STUDY N/A 03/26/2019   Procedure: BRAVO PH STUDY;  Surgeon: Annis Kinder, DO;  Location: [facility];  Service: Gastroenterology;  Laterality: N/A;   BRAVO PH STUDY N/A 06/27/2019   Procedure: BRAVO PH STUDY;  Surgeon: Annis Kinder, DO;  Location: [facility];  Service: Gastroenterology;  Laterality: N/A;   BREAST LUMPECTOMY Left 09/2018   BREAST RECONSTRUCTION  12/2018   CESAREAN SECTION     X 2   ENDOMETRIAL ABLATION     ESOPHAGOGASTRODUODENOSCOPY     said it was done maybe about 10 years ago. In [place]   ESOPHAGOGASTRODUODENOSCOPY (EGD) WITH PROPOFOL  N/A 03/26/2019   Procedure: ESOPHAGOGASTRODUODENOSCOPY (EGD) WITH PROPOFOL ;  Surgeon: Annis Kinder, DO;  Location: [facility];  Service: Gastroenterology;  Laterality: N/A;   ESOPHAGOGASTRODUODENOSCOPY (EGD) WITH PROPOFOL  N/A 06/27/2019   Procedure: ESOPHAGOGASTRODUODENOSCOPY (EGD) WITH PROPOFOL ;  Surgeon: Annis Kinder, DO;  Location: [facility];  Service: Gastroenterology;  Laterality: N/A;   TONSILLECTOMY     TONSILLECTOMY     TUBAL LIGATION     UPPER GASTROINTESTINAL ENDOSCOPY  Social History   Socioeconomic History   Marital status: Divorced    Spouse name: Not on file   Number of children: 2   Years of education: Not on file   Highest education level: Bachelor's degree (e.g., BA, AB, BS)  Occupational History   Occupation: Statistician: [employer]    Comment: [employer]  Tobacco Use   Smoking status: Never   Smokeless tobacco: Never  Vaping Use   Vaping status: Never Used  Substance and Sexual Activity   Alcohol use: Not Currently   Drug use: Never   Sexual  activity: Not Currently    Birth control/protection: Surgical  Other Topics Concern   Not on file  Social History Narrative   Not on file   Social Drivers of Health   Financial Resource Strain: High Risk (12/13/2023)   Overall Financial Resource Strain (CARDIA)    Difficulty of Paying Living Expenses: Very hard  Food Insecurity: Food Insecurity Present (12/13/2023)   Hunger Vital Sign    Worried About Running Out of Food in the Last Year: Often true    Ran Out of Food in the Last Year: Often true  Transportation Needs: No Transportation Needs (12/13/2023)   PRAPARE - Administrator, Civil Service (Medical): No    Lack of Transportation (Non-Medical): No  Physical Activity: Insufficiently Active (12/13/2023)   Exercise Vital Sign    Days of Exercise per Week: 3 days    Minutes of Exercise per Session: 10 min  Stress: Stress Concern Present (12/13/2023)   [employer] of Occupational Health - Occupational Stress Questionnaire    Feeling of Stress : Rather much  Social Connections: Moderately Isolated (12/13/2023)   Social Connection and Isolation Panel    Frequency of Communication with Friends and Family: Once a week    Frequency of Social Gatherings with Friends and Family: Once a week    Attends Religious Services: More than 4 times per year    Active Member of Clubs or Organizations: Yes    Attends Engineer, structural: More than 4 times per year    Marital Status: Divorced    Family History  Problem Relation Age of Onset   Depression Mother    Hypertension Mother    Heart attack Mother    CAD Mother        3 vessel bypass   Irritable bowel syndrome Mother    Aneurysm Mother        brain   CAD Father        3 vessel bypass   Cancer Paternal Grandmother        of the bowel per patient    Bone cancer Paternal Grandmother    Colon cancer Neg Hx    Pancreatic cancer Neg Hx    Liver disease Neg Hx    Stomach cancer Neg Hx    Esophageal cancer  Neg Hx     Health Maintenance  Topic Date Due   Pneumococcal Vaccine 47-65 Years old (1 of 2 - PCV) Never done   COVID-19 Vaccine (3 - Pfizer risk series) 06/28/2024 (Originally 11/19/2019)   Zoster Vaccines- Shingrix (1 of 2) 07/12/2024 (Originally 10/31/1991)   INFLUENZA VACCINE  02/23/2024   Colonoscopy  10/28/2026   Cervical Cancer Screening (HPV/Pap Cotest)  06/23/2027   Hepatitis C Screening  Completed   HIV Screening  Completed   HPV VACCINES  Aged Out   Meningococcal B Vaccine  Aged  Out   DTaP/Tdap/Td  Discontinued     ----------------------------------------------------------------------------------------------------------------------------------------------------------------------------------------------------------------- Physical Exam BP 109/77 (BP Location: Left Arm, Patient Position: Sitting, Cuff Size: Normal)   Pulse 88   Ht 5' 4 (1.626 m)   Wt 143 lb (64.9 kg)   SpO2 97%   BMI 24.55 kg/m   Physical Exam Constitutional:      Appearance: Normal appearance.  HENT:     Head: Normocephalic and atraumatic.   Cardiovascular:     Rate and Rhythm: Normal rate and regular rhythm.  Pulmonary:     Effort: Pulmonary effort is normal.     Breath sounds: Normal breath sounds.   Musculoskeletal:     Cervical back: Neck supple.   Skin:    Comments: Swelling and erythema of the medial right ankle.  She does have tenderness to palpation.   Neurological:     Mental Status: She is alert.   Psychiatric:        Mood and Affect: Mood normal.        Behavior: Behavior normal.     ------------------------------------------------------------------------------------------------------------------------------------------------------------------------------------------------------------------- Assessment and Plan  Insect sting She has tried conservative treatment at home without significant improvement.  Given injection of Depo-Medrol  in office today.  Prescription for  Vistaril as needed for itching.  She may continue triamcinolone  as well as icing.  Red flags reviewed.   Meds ordered this encounter  Medications   methylPREDNISolone  acetate (DEPO-MEDROL ) injection 80 mg   hydrOXYzine (ATARAX) 25 MG tablet    Sig: Take 1 tablet (25 mg total) by mouth 3 (three) times daily as needed for itching.    Dispense:  90 tablet    Refill:  0    No follow-ups on file.

## 2024-01-08 NOTE — Telephone Encounter (Signed)
 Called the line and left a voicemail with my direct-line.Awaiting a response from the referral line.

## 2024-01-08 NOTE — Assessment & Plan Note (Signed)
 She has tried conservative treatment at home without significant improvement.  Given injection of Depo-Medrol  in office today.  Prescription for Vistaril as needed for itching.  She may continue triamcinolone  as well as icing.  Red flags reviewed.

## 2024-01-08 NOTE — Telephone Encounter (Signed)
 FYI Only or Action Required?: FYI only for provider  Patient was last seen in primary care on 12/21/2023 by Gean Keels, MD. Called Nurse Triage reporting Insect Bite. Symptoms began several days ago. Interventions attempted: Prescription medications: Provider gave ointment last year - not working. Symptoms are: Swollen foot ankle red - painfulgradually worsening.  Triage Disposition: See Physician Within 24 Hours  Patient/caregiver understands and will follow disposition?: Yes              Copied from CRM [id number]. Topic: Clinical - Red Word Triage >> Jan 08, 2024  9:01 AM Retta Caster wrote: Red Word that prompted transfer to Nurse Triage: Stung 3 times by yellow jackets RT foot ankle on 06/14 but getting worse/Itching/painful to walk on/Can not put shoe on Reason for Disposition  [1] Red or very tender (to touch) area AND [2] started over 24 hours after the sting  Answer Assessment - Initial Assessment Questions 1. TYPE: What type of sting was it? (bee, yellow jacket, etc.)      Yellow jacket 2. ONSET: When did it occur?      6/14 3. LOCATION: Where is the sting located?  How many stings?     Rt foot 4. SWELLING SIZE: How big is the swelling? (e.g., inches or cm)     Very large 5. REDNESS: Is the area red or pink? If Yes, ask: What size is area of redness? (e.g., inches or cm). When did the redness start?     yes 6. PAIN: Is there any pain? If Yes, ask: How bad is it?  (Scale 1-10; or mild, moderate, severe)     yes 7. ITCHING: Is there any itching? If Yes, ask: How bad is it?      very 8. RESPIRATORY DISTRESS: Describe your breathing.     no 9. PRIOR REACTIONS: Have you had any severe allergic reactions to stings in the past? if yes, ask: What happened?     Yes - sore throat 10. OTHER SYMPTOMS: Do you have any other symptoms? (e.g., abdomen pain, face or tongue swelling, new rash elsewhere, vomiting)       no  Protocols used: Bee  or Yellow Jacket Sting-A-AH

## 2024-01-08 NOTE — Telephone Encounter (Signed)
 FYI - the patient is scheduled to see Dr. Augustus Ledger today at 150 pm.

## 2024-01-08 NOTE — Telephone Encounter (Signed)
 Referral placed to ENT 12/04/2023. Pt has not been contacted.   PCC's, can you assist with this? Thanks

## 2024-01-08 NOTE — Telephone Encounter (Signed)
 I called Dr. Cleopatra Dada Madden's office and they don't except faxed referral. I spoke with someone in referrals and gave her all the information the patient is scheduled on 03/14/24 @ 8:20am. I did faxed the referral to the fax number provided by the office. Patient is aware of the appt and I gave her the phone to call if needed

## 2024-01-11 NOTE — Telephone Encounter (Signed)
 Pt states has used Gabapentin  in the past without relief

## 2024-01-30 ENCOUNTER — Ambulatory Visit (INDEPENDENT_AMBULATORY_CARE_PROVIDER_SITE_OTHER): Payer: Self-pay | Admitting: Family Medicine

## 2024-01-30 ENCOUNTER — Encounter: Payer: Self-pay | Admitting: Family Medicine

## 2024-01-30 VITALS — BP 130/78 | HR 90 | Ht 64.0 in | Wt 135.8 lb

## 2024-01-30 DIAGNOSIS — Z91030 Bee allergy status: Secondary | ICD-10-CM

## 2024-01-30 DIAGNOSIS — R5383 Other fatigue: Secondary | ICD-10-CM

## 2024-01-30 MED ORDER — TRIAMCINOLONE ACETONIDE 0.1 % EX CREA: 1.0000 | TOPICAL_CREAM | Freq: Two times a day (BID) | CUTANEOUS | 1 refills | Status: DC

## 2024-01-30 MED ORDER — ONDANSETRON 4 MG PO TBDP: 4.0000 mg | ORAL_TABLET | Freq: Three times a day (TID) | ORAL | 0 refills | Status: DC | PRN

## 2024-01-30 NOTE — Patient Instructions (Signed)
 Liquid benadryl. Give 50mg  every 6-8 hours as needed for allergic reaction.  Sent zofran  melts for nausea as well.

## 2024-01-30 NOTE — Progress Notes (Signed)
 Established Patient Office Visit  Subjective  Patient ID: Tara Howard, female    DOB: [birth year]  Age: 53 y.o. MRN: [id number]  Chief Complaint  Patient presents with   Hospitalization Follow-up    Pt reports that she was seen in the ED and since her visit she just feels really tired,drained,lightheaded,no energy and just not feeling well    HPI   Pt reports that she was seen in the ED aon July 4 at [facility] and since her visit she just feels really tired,drained,lightheaded,no energy and just not feeling well. Dec appetie and stomach doesn't feel right.    Seen 6/15 for bee sting on rightr ankle that caused localized swelling and itching. Given steroid injection and atarax . Then on 7/4 stung again and went in house, felt bad and then sat on cough, called EMS. She passed out and vomited.  EMS took her to the ED she did vomit on the way to the emergency room as well.  Had labs CMP, CBC..  Given IV fluids, Pepcid, zofran , benadryl and Solu-Medrol .         ROS    Objective:     BP 130/78   Pulse 90   Ht 5' 4 (1.626 m)   Wt 135 lb 12.8 oz (61.6 kg)   SpO2 100%   BMI 23.31 kg/m     Physical Exam Vitals and nursing note reviewed.  Constitutional:      Appearance: Normal appearance.  HENT:     Head: Normocephalic and atraumatic.  Eyes:     Conjunctiva/sclera: Conjunctivae normal.  Cardiovascular:     Rate and Rhythm: Normal rate and regular rhythm.  Pulmonary:     Effort: Pulmonary effort is normal.     Breath sounds: Normal breath sounds.  Skin:    General: Skin is warm and dry.  Neurological:     Mental Status: She is alert.  Psychiatric:        Mood and Affect: Mood normal.      No results found for any visits on 01/30/24.     The ASCVD Risk score (Arnett DK, et al., 2019) failed to calculate for the following reasons:   Cannot find a previous HDL lab   Cannot find a previous total cholesterol lab    Assessment & Plan:   Problem List Items  Addressed This Visit       Other   Fatigue   Other Visit Diagnoses       Allergy  to bee sting    -  Primary       Allergic reaction to bee sting-she does have an allergist and I do think following up with them would be helpful.  If she needs to take Benadryl okay to take 50 mg.  Also sent over prescription for Zofran  as needed.  Continue to rest and stay well-hydrated I think she will feel better as the week goes on.  And if not let me know we can do some additional blood work that her CMP and CBC that she had done in the ED were reassuring.  She also like a refill on the topical steroid cream.  She still has a few areas that are little bit raised and red on her ankle where the previous stings were.  Also encouraged her to consider getting a professional to come out and treat the area for yellow jackets etc.  She is also going to make sure that her emergency contact with us  is up today  and encouraged her to do so with [facility] and other health systems where she is seen.   Meds ordered this encounter  Medications   triamcinolone  cream (KENALOG ) 0.1 %    Sig: Apply 1 Application topically 2 (two) times daily.    Dispense:  30 g    Refill:  1   ondansetron  (ZOFRAN -ODT) 4 MG disintegrating tablet    Sig: Take 1-2 tablets (4-8 mg total) by mouth every 8 (eight) hours as needed for nausea or vomiting.    Dispense:  20 tablet    Refill:  0   I spent 30 minutes on the day of the encounter to include pre-visit record review, face-to-face time with the patient and post visit ordering of test.   Return if symptoms worsen or fail to improve.    Dorothyann Byars, MD

## 2024-02-08 ENCOUNTER — Ambulatory Visit: Admitting: Sports Medicine

## 2024-02-14 ENCOUNTER — Ambulatory Visit: Payer: Self-pay

## 2024-02-14 DIAGNOSIS — F5101 Primary insomnia: Secondary | ICD-10-CM

## 2024-02-14 NOTE — Telephone Encounter (Signed)
  FYI Only or Action Required?: FYI only for provider.  Patient was last seen in primary care on 01/30/2024 by Alvan Dorothyann BIRCH, MD.  Called Nurse Triage reporting Depression.  Symptoms began about a month ago.  Interventions attempted: Prescription medications: depression medication.  Symptoms are: gradually worsening.  Triage Disposition: See Physician Within 24 Hours  Patient/caregiver understands and will follow disposition?: Yes    Triage Disposition: See Physician Within 24 Hours  Patient/caregiver understands and will follow disposition?: YesCopied from CRM #[id number]. Topic: Clinical - Red Word Triage >> Feb 14, 2024  8:27 AM Cherylann RAMAN wrote: Red Word that prompted transfer to Nurse Triage: Patient called in and stated that she is feeling more depressed with anxiety. Reason for Disposition  [1] Depression AND [2] getting worse (e.g., sleeping poorly, less able to do activities of daily living)    Offered pt morning appt: pt stated has another appt tomorrow and pt requested afternoon appt: appt scheduled  Answer Assessment - Initial Assessment Questions 1. CONCERN: What happened that made you call today?     Daughter's 17th birthday & she does not speak Mom, ended relationship with boyfriend, financially struggling 2. DEPRESSION SYMPTOM SCREENING: How are you feeling overall? (e.g., decreased energy, increased sleeping or difficulty sleeping, difficulty concentrating, feelings of sadness, guilt, hopelessness, or worthlessness)     increased sleeping or difficulty sleeping, difficulty concentrating, feelings of sadness, guilt, 3. RISK OF HARM - SUICIDAL IDEATION:  Do you ever have thoughts of hurting or killing yourself?  (e.g., yes, no, no but preoccupation with thoughts about death)     no 4. RISK OF HARM - HOMICIDAL IDEATION:  Do you ever have thoughts of hurting or killing someone else?  (e.g., yes, no, no but preoccupation with thoughts about death)     no 5.  FUNCTIONAL IMPAIRMENT: How have things been going for you overall? Have you had more difficulty than usual doing your normal daily activities?  (e.g., better, same, worse; self-care, school, work, interactions)     More difficult 6. SUPPORT: Who is with you now? Who do you live with? Do you have family or friends who you can talk to?      yes 7. THERAPIST: Do you have a counselor or therapist? If Yes, ask: What is their name?     No - can't afford right now 8. STRESSORS: Has there been any new stress or recent changes in your life?     Money, 9. ALCOHOL USE OR SUBSTANCE USE (DRUG USE): Do you drink alcohol or use any illegal drugs?     o 10. OTHER: Do you have any other physical symptoms right now? (e.g., fever)       Can't concentrate at work, not able to sleep properly 11. PREGNANCY: Is there any chance you are pregnant? When was your last menstrual period?       na  Protocols used: Depression-A-AH

## 2024-02-15 ENCOUNTER — Ambulatory Visit (INDEPENDENT_AMBULATORY_CARE_PROVIDER_SITE_OTHER): Payer: Self-pay | Admitting: Sports Medicine

## 2024-02-15 ENCOUNTER — Ambulatory Visit: Admitting: Family Medicine

## 2024-02-15 DIAGNOSIS — G5601 Carpal tunnel syndrome, right upper limb: Secondary | ICD-10-CM

## 2024-02-15 NOTE — Assessment & Plan Note (Signed)
 This very pleasant 53 year old female returns, she has known cervical DDD, severe spinal stenosis, at the last visit she was having increasing discomfort right hand, wrist throughout the day and night with numbness 1st through 4th fingers, nothing proximal to the wrist, she had a positive Tinel's and Phalen signs and a negative Spurling sign, at the last visit, May 29 we did a median nerve hydrodissection, she had complete relief of all of her carpal tunnel symptoms. Unfortunately she was stung by the yellow jackets, and had some recurrence of discomfort. I told her this was likely from the swelling that occurs during yellowjacket stings and anaphylaxis. She only got about 20% worse, I have suggested restarting nocturnal splinting, home PT and doing this for 6 weeks before we consider another hydrodissection.

## 2024-02-15 NOTE — Progress Notes (Signed)
    Procedures performed today:    None.  Independent interpretation of notes and tests performed by another provider:   None.  Brief History, Exam, Impression, and Recommendations:    Carpal tunnel syndrome on right This very pleasant 53 year old female returns, she has known cervical DDD, severe spinal stenosis, at the last visit she was having increasing discomfort right hand, wrist throughout the day and night with numbness 1st through 4th fingers, nothing proximal to the wrist, she had a positive Tinel's and Phalen signs and a negative Spurling sign, at the last visit, May 29 we did a median nerve hydrodissection, she had complete relief of all of her carpal tunnel symptoms. Unfortunately she was stung by the yellow jackets, and had some recurrence of discomfort. I told her this was likely from the swelling that occurs during yellowjacket stings and anaphylaxis. She only got about 20% worse, I have suggested restarting nocturnal splinting, home PT and doing this for 6 weeks before we consider another hydrodissection.    ____________________________________________ Debby PARAS. Curtis, M.D., ABFM., CAQSM., AME. Primary Care and Sports Medicine [facility]  Adjunct Professor of [facility] Medicine  [employer]  Restaurant manager, fast food

## 2024-02-15 NOTE — Patient Instructions (Signed)
 Tara Howard

## 2024-02-16 ENCOUNTER — Other Ambulatory Visit: Payer: Self-pay | Admitting: Family Medicine

## 2024-02-16 DIAGNOSIS — F5101 Primary insomnia: Secondary | ICD-10-CM

## 2024-02-16 MED ORDER — TRAZODONE HCL 100 MG PO TABS: 150.0000 mg | ORAL_TABLET | Freq: Every evening | ORAL | 0 refills | Status: DC | PRN

## 2024-02-16 NOTE — Addendum Note (Signed)
 Addended by: Marylene Masek D on: 02/16/2024 01:19 PM   Modules accepted: Orders

## 2024-02-16 NOTE — Telephone Encounter (Signed)
 Patient is asking that the following medication: traZODone  (DESYREL ) 100 MG tablet be increased from 1 tablet to 1 and a half.

## 2024-02-20 ENCOUNTER — Ambulatory Visit: Admitting: Family Medicine

## 2024-03-26 ENCOUNTER — Encounter: Payer: Self-pay | Admitting: Sports Medicine

## 2024-03-28 ENCOUNTER — Ambulatory Visit: Admitting: Sports Medicine

## 2024-04-01 ENCOUNTER — Ambulatory Visit: Payer: Self-pay

## 2024-04-01 DIAGNOSIS — R42 Dizziness and giddiness: Secondary | ICD-10-CM

## 2024-04-01 NOTE — Telephone Encounter (Signed)
 This RN attempted to reach patient, LVM with callback number   Copied from CRM #[id number]. Topic: Clinical - Medical Advice >> Apr 01, 2024  4:49 PM Tara Howard wrote: Reason for CRM: Patient wants to know if provider can recommend anything for hot flashes. Hx on cancer oncologist does not want her to take estrogen products. Menopause since 48 hot flashes.  Thank You

## 2024-04-02 ENCOUNTER — Ambulatory Visit: Payer: Self-pay | Admitting: Family Medicine

## 2024-04-02 NOTE — Telephone Encounter (Signed)
 FYI Only or Action Required?: Action required by provider: medication request.  Patient was last seen in primary care on 02/15/2024 by Curtis Debby PARAS, MD.  Called Nurse Triage reporting Hot Flashes.  Symptoms began several months ago.  Symptoms are: gradually worsening.  Triage Disposition: See PCP Within 2 Weeks  Patient/caregiver understands and will follow disposition?: No, wishes to speak with PCP             Copied from CRM #[id number]. Topic: Clinical - Medical Advice >> Apr 01, 2024  4:49 PM Graeme ORN wrote: Reason for CRM: Patient wants to know if provider can recommend anything for hot flashes. Hx on cancer oncologist does not want her to take estrogen products. Menopause since 48 hot flashes.  Thank You >> Apr 02, 2024 11:02 AM Corin V wrote: Patient returning call from [facility] Reason for Disposition  [1] Menopause symptoms (e.g., hot flashes, irregular periods, vaginal dryness) AND [2] interfere with normal activities like work or sleep) AND [3] no improvement after using Care Advice  Answer Assessment - Initial Assessment Questions Pt wants a medication prescribed or recommended for the below symptoms. Pt states she recently started a new job so it will be difficult to take off time to come in for an appointment. Pt call back number is [phone]. Pt is not able to have her phone at work so please lvm.  Pt pharmacy: [facility] [phone] - [place], [place] - [address] AT [facility] [address] [facility] [address], [place] [address] Phone: [phone]  Fax: [phone]     Pt states she has been in menopause for the past 5 years.  MAIN SYMPTOM: What is your main symptom? How bad is it?  (e.g., difficulty concentrating, hot flashes, fatigue, irregular periods, vaginal dryness)     Hot flashes throughout day even when cold, significant night sweats (had disappeared for a couple of years but came  back)  ONSET: When did the symptoms begin? (e.g., hours, days or weeks ago)     Last 2 months  MEDICINES: Are you taking any hormone prescription or over-the-counter medicines? (e.g., estrogen; estrogen/progesterone , herbal supplements, medicines for mood, new medicines, Niacin)     No, pt states she has a hx of breast cancer  OTHER SYMPTOMS: Do you have any other symptoms? (e.g., insomnia, mood swings, palpitations)     No  Protocols used: Menopause Symptoms and Questions-A-AH

## 2024-04-02 NOTE — Telephone Encounter (Signed)
 See other message

## 2024-04-03 ENCOUNTER — Encounter: Payer: Self-pay | Admitting: Gastroenterology

## 2024-04-03 ENCOUNTER — Other Ambulatory Visit (HOSPITAL_COMMUNITY): Payer: Self-pay

## 2024-04-03 ENCOUNTER — Telehealth: Payer: Self-pay

## 2024-04-03 ENCOUNTER — Encounter: Payer: Self-pay | Admitting: Family Medicine

## 2024-04-03 NOTE — Telephone Encounter (Signed)
 PA request has been Submitted. New Encounter has been or will be created for follow up. For additional info see Pharmacy Prior Auth telephone encounter from 04-03-2024.

## 2024-04-03 NOTE — Addendum Note (Signed)
 Addended by: Lanard Arguijo D on: 04/03/2024 02:42 PM   Modules accepted: Orders

## 2024-04-03 NOTE — Telephone Encounter (Signed)
 Please call and get further detail.  She is on venlafaxine  and sometimes that can actually help some.  Over-the-counter black cohosh and evening primrose oil can be helpful as well.  If she want to consider going back on a low-dose of gabapentin  to see if that helps.

## 2024-04-03 NOTE — Telephone Encounter (Signed)
 Pharmacy Patient Advocate Encounter   Received notification from Patient Advice Request messages that prior authorization for RABEprazole  Sodium 20MG  dr tablets is required/requested.   Insurance verification completed.   The patient is insured through [employer] .   Per test claim: PA required; PA submitted to above mentioned insurance via Latent Key/confirmation #/EOC [id number] Status is pending

## 2024-04-04 NOTE — Telephone Encounter (Signed)
 See note from 9/8, I did address her question

## 2024-04-04 NOTE — Telephone Encounter (Signed)
 Pharmacy Patient Advocate Encounter  Received notification from [employer] that Prior Authorization for RABEprazole  Sodium 20MG  dr tablets has been APPROVED from 04-03-2024 to 04-03-2025   PA #/Case ID/Reference #: [id number]

## 2024-04-04 NOTE — Telephone Encounter (Signed)
 Spoke with patient. Informed of the recommendations from Dr. Alvan she is going to research the black cohash and evening primrose oil. She also recently purchased Amberen off of [employer] ( active ingredients of  rice flour, gelatin, magnesium  stearate, silicon dioxide, riboflavin, carmine and water) she is wanting to know Dr. Vernida thoughts on this as well.

## 2024-04-05 NOTE — Telephone Encounter (Signed)
 Okay to try theAmberen, it sounds like the magnesium  really is the main ingredient there are some people who do feel like magnesium  helps him sleep or rest better so it is worth a try to see if she feels like that is helpful

## 2024-04-08 NOTE — Addendum Note (Signed)
 Addended by: BONNY JON DEL on: 04/08/2024 02:43 PM   Modules accepted: Orders

## 2024-04-09 ENCOUNTER — Other Ambulatory Visit: Payer: Self-pay | Admitting: Family Medicine

## 2024-04-09 ENCOUNTER — Ambulatory Visit (INDEPENDENT_AMBULATORY_CARE_PROVIDER_SITE_OTHER): Payer: Self-pay | Admitting: Family Medicine

## 2024-04-09 ENCOUNTER — Encounter: Payer: Self-pay | Admitting: Family Medicine

## 2024-04-09 VITALS — BP 127/77 | HR 91 | Ht 64.0 in | Wt 140.5 lb

## 2024-04-09 DIAGNOSIS — R42 Dizziness and giddiness: Secondary | ICD-10-CM

## 2024-04-09 DIAGNOSIS — R748 Abnormal levels of other serum enzymes: Secondary | ICD-10-CM

## 2024-04-09 DIAGNOSIS — R5383 Other fatigue: Secondary | ICD-10-CM

## 2024-04-09 DIAGNOSIS — R61 Generalized hyperhidrosis: Secondary | ICD-10-CM | POA: Insufficient documentation

## 2024-04-09 DIAGNOSIS — F5101 Primary insomnia: Secondary | ICD-10-CM

## 2024-04-09 DIAGNOSIS — G479 Sleep disorder, unspecified: Secondary | ICD-10-CM

## 2024-04-09 MED ORDER — MECLIZINE HCL 25 MG PO TABS: 25.0000 mg | ORAL_TABLET | Freq: Three times a day (TID) | ORAL | 3 refills | Status: AC | PRN

## 2024-04-09 MED ORDER — MECLIZINE HCL 25 MG PO TABS: 25.0000 mg | ORAL_TABLET | Freq: Three times a day (TID) | ORAL | 1 refills | Status: DC | PRN

## 2024-04-09 NOTE — Telephone Encounter (Signed)
 Rx sent.

## 2024-04-09 NOTE — Patient Instructions (Addendum)
 Ok to increase the venlafaxine  to 3 tabs.  Recommend trial for at least 3 to 4 weeks to see if you notice any improvement if not then please just drop back down to 2 tabs.

## 2024-04-09 NOTE — Progress Notes (Signed)
 Acute Office Visit  Subjective:     Patient ID: Tara Howard, female    DOB: October 18, 1970, 53 y.o.   MRN: [id number]  Chief Complaint  Patient presents with   Fatigue    Pt stated that she has felt really tired for the past month but this has gotten worse over the past 2 weeks. She hasn't been sleeping well, it takes her longer to fall asleep and she is waking up during the night   Abnormal Lab    Pt stated that she has been taking supplements and read that these could cause problems with the liver  She would like to get her liver enzymes checked    HPI Patient is in today for   Discussed the use of AI scribe software for clinical note transcription with the patient, who gave verbal consent to proceed.  History of Present Illness Tara Howard is a 53 year old female who presents with fatigue and hot flashes.  Fatigue and sleep disturbance - Significant fatigue and exhaustion for the past month - Difficulty getting out of bed in the morning - Feels worn out by the end of the day, often skips meals and goes to bed early - Sleep duration from 8:00-8:30 PM until 6:00 AM, but does not feel rested - Frequent nighttime awakenings - Fatigue onset coincided with discontinuation of glutathione about a month ago  Vasomotor symptoms - Increased night sweats and hot flashes over the past month - Sweating more at night compared to one month ago - No major weight fluctuations  Musculoskeletal pain - Soreness in back, neck, hips, and knees - Pain attributed to work activities, including sitting on the floor and running up and down halls - No muscle aches, but whole back pain due to work positions  Hepatic concerns - History of elevated liver enzymes. Last alk phos was 139 in July.  - Elevated liver levels during a check-up two months ago - Recently stopped taking Moringa and glutathione; glutathione discontinuation coincided with increased fatigue - Currently taking collagen,  beef liver, and magnesium  supplements - Expresses concern about liver health  Medication effects and adherence - Current medications: trazodone  (nightly, sometimes increased dose if waking at night), venlafaxine  (twice daily), rabeprazole  (morning and night) - Higher doses of trazodone  cause 'hungover' feeling and dry mouth - Attempts to reduce rabeprazole  result in worsening stomach symptoms  Easy bruising - No excess bleeding - History of easy bruising    ROS      Objective:    BP 127/77   Pulse 91   Ht 5' 4 (1.626 m)   Wt 140 lb 8 oz (63.7 kg)   SpO2 99%   BMI 24.12 kg/m    Physical Exam Vitals reviewed.  Constitutional:      Appearance: Normal appearance.  HENT:     Head: Normocephalic.  Pulmonary:     Effort: Pulmonary effort is normal.  Neurological:     Mental Status: She is alert and oriented to person, place, and time.  Psychiatric:        Mood and Affect: Mood normal.        Behavior: Behavior normal.     Results for orders placed or performed in visit on 04/09/24  CMP14+EGFR  Result Value Ref Range   Glucose 102 (H) 70 - 99 mg/dL   BUN 14 6 - 24 mg/dL   Creatinine, Ser 9.04 0.57 - 1.00 mg/dL   eGFR 73 >40 fO/fpw/8.26   BUN/Creatinine Ratio 15  9 - 23   Sodium 141 134 - 144 mmol/L   Potassium 3.8 3.5 - 5.2 mmol/L   Chloride 100 96 - 106 mmol/L   CO2 24 20 - 29 mmol/L   Calcium 9.7 8.7 - 10.2 mg/dL   Total Protein 6.6 6.0 - 8.5 g/dL   Albumin 4.2 3.8 - 4.9 g/dL   Globulin, Total 2.4 1.5 - 4.5 g/dL   Bilirubin Total 0.4 0.0 - 1.2 mg/dL   Alkaline Phosphatase 151 (H) 49 - 135 IU/L   AST 10 0 - 40 IU/L   ALT 12 0 - 32 IU/L  CBC with Differential/Platelet  Result Value Ref Range   WBC 5.3 3.4 - 10.8 x10E3/uL   RBC 4.55 3.77 - 5.28 x10E6/uL   Hemoglobin 13.9 11.1 - 15.9 g/dL   Hematocrit 56.5 65.9 - 46.6 %   MCV 95 79 - 97 fL   MCH 30.5 26.6 - 33.0 pg   MCHC 32.0 31.5 - 35.7 g/dL   RDW 86.4 88.2 - 84.5 %   Platelets 254 150 - 450 x10E3/uL    Neutrophils 47 Not Estab. %   Lymphs 40 Not Estab. %   Monocytes 10 Not Estab. %   Eos 2 Not Estab. %   Basos 1 Not Estab. %   Neutrophils Absolute 2.5 1.4 - 7.0 x10E3/uL   Lymphocytes Absolute 2.1 0.7 - 3.1 x10E3/uL   Monocytes Absolute 0.5 0.1 - 0.9 x10E3/uL   EOS (ABSOLUTE) 0.1 0.0 - 0.4 x10E3/uL   Basophils Absolute 0.0 0.0 - 0.2 x10E3/uL   Immature Granulocytes 0 Not Estab. %   Immature Grans (Abs) 0.0 0.0 - 0.1 x10E3/uL  TSH  Result Value Ref Range   TSH 2.470 0.450 - 4.500 uIU/mL  Estradiol   Result Value Ref Range   Estradiol  <5.0 pg/mL  Progesterone   Result Value Ref Range   Progesterone  <0.1 ng/mL  Follicle stimulating hormone  Result Value Ref Range   FSH 91.4 mIU/mL  Luteinizing hormone  Result Value Ref Range   LH 65.0 mIU/mL  NASH FibroSure(R) Plus  Result Value Ref Range   Fibrosis Score 0.08 0.00 - 0.21   Fibrosis Stage Comment    Steatosis Score 0.28 0.00 - 0.40   Steatosis Grade Comment    NASH Score 0.00 0.00 - 0.25   NASH Grade Comment    Methodology: Comment    ALPHA 2-MACROGLOBULINS, QN 213 110 - 276 mg/dL   Haptoglobin 884 33 - 346 mg/dL   Apolipoprotein A-1 855 116 - 209 mg/dL   Bilirubin, Total 0.2 0.0 - 1.2 mg/dL   GGT 11 0 - 60 IU/L   ALT (SGPT) P5P 22 0 - 40 IU/L   AST (SGOT) P5P 22 0 - 40 IU/L   Cholesterol, Total 211 (H) 100 - 199 mg/dL   Glucose 899 (H) 70 - 99 mg/dL   Triglycerides 75 0 - 149 mg/dL   Interpretations: Comment    Fibrosis Scoring: Comment    Steatosis Scoring Comment    NASH Scoring Comment    Limitations: Comment    Comment: Comment         Assessment & Plan:   Problem List Items Addressed This Visit       Other   Insomnia   Relevant Medications   ramelteon  (ROZEREM ) 8 MG tablet   Fatigue - Primary   Relevant Orders   CMP14+EGFR (Completed)   CBC with Differential/Platelet (Completed)   TSH (Completed)   Estradiol  (Completed)   Progesterone  (Completed)  Follicle stimulating hormone (Completed)    Luteinizing hormone (Completed)   NASH FibroSure(R) Plus (Completed)   Excessive sweating   Relevant Medications   venlafaxine  (EFFEXOR ) 37.5 MG tablet   Other Visit Diagnoses       Sleep disturbances       Relevant Orders   CMP14+EGFR (Completed)   CBC with Differential/Platelet (Completed)   TSH (Completed)   Estradiol  (Completed)   Progesterone  (Completed)   Follicle stimulating hormone (Completed)   Luteinizing hormone (Completed)   NASH FibroSure(R) Plus (Completed)     Elevated liver enzymes       Relevant Orders   CMP14+EGFR (Completed)   CBC with Differential/Platelet (Completed)   TSH (Completed)   Estradiol  (Completed)   Progesterone  (Completed)   Follicle stimulating hormone (Completed)   Luteinizing hormone (Completed)   NASH FibroSure(R) Plus (Completed)   US  ABDOMEN COMPLETE W/ELASTOGRAPHY     Elevated alkaline phosphatase level           Meds ordered this encounter  Medications   venlafaxine  (EFFEXOR ) 37.5 MG tablet    Sig: Take 3 tablets (112.5 mg total) by mouth daily.    Dispense:  90 tablet    Refill:  1   ramelteon  (ROZEREM ) 8 MG tablet    Sig: Take 1 tablet (8 mg total) by mouth at bedtime.    Dispense:  90 tablet    Refill:  1   Assessment and Plan Assessment & Plan Fatigue and sleep disturbance Experiences non-restful sleep and nocturnal awakenings despite an 8-hour sleep opportunity. Currently on trazodone  but hesitant to change due to past experiences with other sleep aids. - Prescribe Rozerem  as an alternative sleep aid, which binds to melatonin receptors and is non-habit-forming. - Monitor sleep quality and side effects after starting Rozerem .  Abnormal liver enzymes Elevated liver enzymes, possibly due to long-term medication use. Concerned about liver health and potential impact of medications and supplements. - Order liver ultrasound. - Order FibroSure test through [facility] to assess cirrhosis risk and guide management.  Hot  flashes associated with menopausal state Increased hot flashes despite current venlafaxine  use. Discussed increasing venlafaxine  dosage as a non-hormonal treatment option. - Increase venlafaxine  dosage to three tablets daily for 3-4 weeks. - Monitor for changes in hot flash frequency and intensity.  Gastroesophageal reflux disease Symptoms controlled with rabeprazole  twice daily. Attempted dosage reduction led to symptom exacerbation. - Continue rabeprazole  twice daily.    No follow-ups on file.  Dorothyann Byars, MD

## 2024-04-09 NOTE — Addendum Note (Signed)
 Addended by: Suhana Wilner D on: 04/09/2024 07:46 AM   Modules accepted: Orders

## 2024-04-10 LAB — NASH FIBROSURE(R) PLUS

## 2024-04-11 ENCOUNTER — Encounter: Payer: Self-pay | Admitting: Family Medicine

## 2024-04-11 DIAGNOSIS — F5101 Primary insomnia: Secondary | ICD-10-CM

## 2024-04-11 LAB — CMP14+EGFR
ALT: 12 IU/L (ref 0–32)
AST: 10 IU/L (ref 0–40)
Albumin: 4.2 g/dL (ref 3.8–4.9)
Alkaline Phosphatase: 151 IU/L — ABNORMAL HIGH (ref 49–135)
BUN/Creatinine Ratio: 15 (ref 9–23)
BUN: 14 mg/dL (ref 6–24)
Bilirubin Total: 0.4 mg/dL (ref 0.0–1.2)
CO2: 24 mmol/L (ref 20–29)
Calcium: 9.7 mg/dL (ref 8.7–10.2)
Chloride: 100 mmol/L (ref 96–106)
Creatinine, Ser: 0.95 mg/dL (ref 0.57–1.00)
Globulin, Total: 2.4 g/dL (ref 1.5–4.5)
Glucose: 102 mg/dL — ABNORMAL HIGH (ref 70–99)
Potassium: 3.8 mmol/L (ref 3.5–5.2)
Sodium: 141 mmol/L (ref 134–144)
Total Protein: 6.6 g/dL (ref 6.0–8.5)
eGFR: 73 mL/min/1.73 (ref >59)

## 2024-04-11 LAB — CBC WITH DIFFERENTIAL/PLATELET
Basophils Absolute: 0 x10E3/uL (ref 0.0–0.2)
Basos: 1 %
EOS (ABSOLUTE): 0.1 x10E3/uL (ref 0.0–0.4)
Eos: 2 %
Hematocrit: 43.4 % (ref 34.0–46.6)
Hemoglobin: 13.9 g/dL (ref 11.1–15.9)
Immature Grans (Abs): 0 x10E3/uL (ref 0.0–0.1)
Immature Granulocytes: 0 %
Lymphocytes Absolute: 2.1 x10E3/uL (ref 0.7–3.1)
Lymphs: 40 %
MCH: 30.5 pg (ref 26.6–33.0)
MCHC: 32 g/dL (ref 31.5–35.7)
MCV: 95 fL (ref 79–97)
Monocytes Absolute: 0.5 x10E3/uL (ref 0.1–0.9)
Monocytes: 10 %
Neutrophils Absolute: 2.5 x10E3/uL (ref 1.4–7.0)
Neutrophils: 47 %
Platelets: 254 x10E3/uL (ref 150–450)
RBC: 4.55 x10E6/uL (ref 3.77–5.28)
RDW: 13.5 % (ref 11.7–15.4)
WBC: 5.3 x10E3/uL (ref 3.4–10.8)

## 2024-04-11 LAB — ESTRADIOL: Estradiol: <5 pg/mL

## 2024-04-11 LAB — NASH FIBROSURE(R) PLUS
ALPHA 2-MACROGLOBULINS, QN: 213 mg/dL (ref 110–276)
ALT (SGPT) P5P: 22 IU/L (ref 0–40)
AST (SGOT) P5P: 22 IU/L (ref 0–40)
Apolipoprotein A-1: 144 mg/dL (ref 116–209)
Bilirubin, Total: 0.2 mg/dL (ref 0.0–1.2)
Cholesterol, Total: 211 mg/dL — AB (ref 100–199)
Fibrosis Score: 0.08 (ref 0.00–0.21)
GGT: 11 IU/L (ref 0–60)
Glucose: 100 mg/dL — AB (ref 70–99)
Haptoglobin: 115 mg/dL (ref 33–346)
NASH Score: 0 (ref 0.00–0.25)
Steatosis Score: 0.28 (ref 0.00–0.40)
Triglycerides: 75 mg/dL (ref 0–149)

## 2024-04-11 LAB — LUTEINIZING HORMONE: LH: 65 m[IU]/mL

## 2024-04-11 LAB — TSH: TSH: 2.47 u[IU]/mL (ref 0.450–4.500)

## 2024-04-11 LAB — PROGESTERONE: Progesterone: <0.1 ng/mL

## 2024-04-11 LAB — FOLLICLE STIMULATING HORMONE: FSH: 91.4 m[IU]/mL

## 2024-04-11 MED ORDER — RAMELTEON 8 MG PO TABS: 8.0000 mg | ORAL_TABLET | Freq: Every day | ORAL | 1 refills | Status: DC

## 2024-04-11 MED ORDER — VENLAFAXINE HCL 37.5 MG PO TABS: 112.5000 mg | ORAL_TABLET | Freq: Every day | ORAL | 1 refills | Status: DC

## 2024-04-11 NOTE — Addendum Note (Signed)
 Addended by: Patrecia Veiga D on: 04/11/2024 12:58 PM   Modules accepted: Orders

## 2024-04-12 ENCOUNTER — Ambulatory Visit: Payer: Self-pay | Admitting: Family Medicine

## 2024-04-12 NOTE — Progress Notes (Signed)
 HI Tara Howard,  Great news!!!  The Fibrosure indicates no fibrosis (scarring) or increased fat ( steatosis).

## 2024-05-08 ENCOUNTER — Encounter: Payer: Self-pay | Admitting: Physician Assistant

## 2024-05-08 ENCOUNTER — Other Ambulatory Visit (HOSPITAL_COMMUNITY)
Admission: RE | Admit: 2024-05-08 | Discharge: 2024-05-08 | Disposition: A | Discharge status: Home / Self Care | Source: Ambulatory Visit | Attending: Physician Assistant | Admitting: Physician Assistant

## 2024-05-08 ENCOUNTER — Ambulatory Visit (INDEPENDENT_AMBULATORY_CARE_PROVIDER_SITE_OTHER): Admitting: Physician Assistant

## 2024-05-08 VITALS — BP 133/81 | HR 97 | Ht 64.0 in | Wt 141.0 lb

## 2024-05-08 DIAGNOSIS — Z124 Encounter for screening for malignant neoplasm of cervix: Secondary | ICD-10-CM | POA: Insufficient documentation

## 2024-05-08 DIAGNOSIS — N939 Abnormal uterine and vaginal bleeding, unspecified: Secondary | ICD-10-CM | POA: Diagnosis not present

## 2024-05-08 DIAGNOSIS — Z170 Estrogen receptor positive status [ER+]: Secondary | ICD-10-CM

## 2024-05-08 DIAGNOSIS — N942 Vaginismus: Secondary | ICD-10-CM

## 2024-05-08 DIAGNOSIS — Z9229 Personal history of other drug therapy: Secondary | ICD-10-CM | POA: Insufficient documentation

## 2024-05-08 DIAGNOSIS — C50412 Malignant neoplasm of upper-outer quadrant of left female breast: Secondary | ICD-10-CM

## 2024-05-08 NOTE — Patient Instructions (Signed)
 U/S ordered for pelvis Pap smear sent off today

## 2024-05-08 NOTE — Progress Notes (Signed)
   Established Patient Office Visit  Subjective   Patient ID: Tara Howard, female    DOB: October 05, 1970  Age: 53 y.o. MRN: [id number]  HPI Discussed the use of AI scribe software for clinical note transcription with the patient, who gave verbal consent to proceed.  History of Present Illness Tara Howard is a 53 year old female who presents for an annual pelvic exam and Pap smear.  Abnormal uterine bleeding - Unusual spotting occurred last week - History of endometrial ablation in 2008 with subsequent amenorrhea until this recent episode - All female reproductive organs intact, including ovaries, uterus, and cervix  Breast cancer history and surveillance - Diagnosed with breast cancer in 2020 - Completed five years of tamoxifen therapy, ending January 2025 - Oncologist advised that tamoxifen increases risk of uterine cancer - Does not undergo regular mammograms due to complete removal of breast tissue  Cervical cancer screening - Last Pap smear performed on June 22, 2022, with no concerning features   ROS See HPI.    Objective:     BP 133/81   Pulse 97   Ht 5' 4 (1.626 m)   Wt 141 lb (64 kg)   SpO2 99%   BMI 24.20 kg/m  BP Readings from Last 3 Encounters:  05/08/24 133/81  04/09/24 127/77  01/30/24 130/78   Wt Readings from Last 3 Encounters:  05/08/24 141 lb (64 kg)  04/09/24 140 lb 8 oz (63.7 kg)  01/30/24 135 lb 12.8 oz (61.6 kg)     Physical Exam Constitutional:      Appearance: Normal appearance.  Cardiovascular:     Rate and Rhythm: Normal rate and regular rhythm.  Pulmonary:     Effort: Pulmonary effort is normal.     Breath sounds: Normal breath sounds.  Genitourinary:    General: Normal vulva.     Vagina: No vaginal discharge.     Rectum: Normal.     Comments: Painful to patient with use of small speculum. Vaginismus noted. No masses or polyps of cervix. Friable.  Neurological:     Mental Status: She is alert.  Psychiatric:         Mood and Affect: Mood normal.        Assessment & Plan:  .SABRAJulaine was seen today for medical management of chronic issues.  Diagnoses and all orders for this visit:  Routine Papanicolaou smear -     Cancel: Cytology - PAP -     Cytology - PAP  Malignant neoplasm of upper-outer quadrant of left breast in female, estrogen receptor positive (HCC)  Vaginal spotting -     US  Pelvic Complete With Transvaginal; Future  H/O tamoxifen therapy -     US  Pelvic Complete With Transvaginal; Future   Assessment & Plan  Annual pelvic exam and Pap smear scheduled. Last Pap smear in November 2023 was normal. - Perform Pap smear to check for cervical abnormalities. - small speculum used due to vaginismus, pt tolerate fairly well.   Post-ablation bleeding Reported unusual spotting. Tamoxifen increases uterine cancer risk. Pap smears do not assess uterus; transvaginal ultrasound needed for uterine lining evaluation. - Order transvaginal ultrasound to assess uterine lining.  History of breast cancer Breast cancer in 2020, completed five years of tamoxifen therapy, ended January 2025. No current mammograms due to removal of breast tissue.     Maeli Spacek, PA-C

## 2024-05-09 LAB — CYTOLOGY - PAP: Diagnosis: NEGATIVE

## 2024-05-10 ENCOUNTER — Ambulatory Visit: Payer: Self-pay | Admitting: Physician Assistant

## 2024-05-10 DIAGNOSIS — D252 Subserosal leiomyoma of uterus: Secondary | ICD-10-CM

## 2024-05-10 MED ORDER — TRAZODONE HCL 100 MG PO TABS: 200.0000 mg | ORAL_TABLET | Freq: Every evening | ORAL | 1 refills | Status: DC | PRN

## 2024-05-10 NOTE — Addendum Note (Signed)
 Addended by: Macaela Presas D on: 05/10/2024 12:20 PM   Modules accepted: Orders

## 2024-05-10 NOTE — Progress Notes (Signed)
 No abnormal cells present! GREAT news.

## 2024-05-11 ENCOUNTER — Ambulatory Visit (HOSPITAL_BASED_OUTPATIENT_CLINIC_OR_DEPARTMENT_OTHER)

## 2024-05-12 ENCOUNTER — Other Ambulatory Visit: Payer: Self-pay | Admitting: Family Medicine

## 2024-05-12 DIAGNOSIS — R61 Generalized hyperhidrosis: Secondary | ICD-10-CM

## 2024-05-14 ENCOUNTER — Other Ambulatory Visit: Payer: Self-pay | Admitting: Family Medicine

## 2024-05-14 DIAGNOSIS — F5101 Primary insomnia: Secondary | ICD-10-CM

## 2024-05-20 ENCOUNTER — Ambulatory Visit

## 2024-05-20 DIAGNOSIS — N939 Abnormal uterine and vaginal bleeding, unspecified: Secondary | ICD-10-CM

## 2024-05-20 DIAGNOSIS — Z9229 Personal history of other drug therapy: Secondary | ICD-10-CM | POA: Diagnosis not present

## 2024-05-22 DIAGNOSIS — D252 Subserosal leiomyoma of uterus: Secondary | ICD-10-CM | POA: Insufficient documentation

## 2024-05-22 NOTE — Progress Notes (Signed)
 Reghan,   Uterine fibroid is seen that could cause bleeding.  Endometrial lining is normal.  No urgency but need to discuss with GYN about treatment of fibroid.

## 2024-05-30 ENCOUNTER — Telehealth: Payer: Self-pay | Admitting: *Deleted

## 2024-05-30 ENCOUNTER — Telehealth: Payer: Self-pay

## 2024-05-30 ENCOUNTER — Ambulatory Visit: Admitting: Family Medicine

## 2024-05-30 ENCOUNTER — Encounter: Payer: Self-pay | Admitting: Family Medicine

## 2024-05-30 VITALS — BP 120/68 | HR 80 | Ht 64.0 in | Wt 141.6 lb

## 2024-05-30 DIAGNOSIS — F439 Reaction to severe stress, unspecified: Secondary | ICD-10-CM | POA: Diagnosis not present

## 2024-05-30 DIAGNOSIS — K5904 Chronic idiopathic constipation: Secondary | ICD-10-CM | POA: Diagnosis not present

## 2024-05-30 DIAGNOSIS — Z23 Encounter for immunization: Secondary | ICD-10-CM

## 2024-05-30 DIAGNOSIS — B370 Candidal stomatitis: Secondary | ICD-10-CM

## 2024-05-30 MED ORDER — NYSTATIN 100000 UNIT/ML MT SUSP: 5.0000 mL | Freq: Three times a day (TID) | OROMUCOSAL | 0 refills | Status: AC

## 2024-05-30 MED ORDER — LACTULOSE 10 GM/15ML PO SOLN: 10.0000 g | Freq: Two times a day (BID) | ORAL | 0 refills | Status: AC | PRN

## 2024-05-30 NOTE — Progress Notes (Signed)
 Acute Office Visit  Patient ID: Tara Howard, female    DOB: [birth year], 53 y.o.   MRN: [id number]  PCP: Alvan Dorothyann BIRCH, MD  Chief Complaint  Patient presents with   [name]    Subjective:     HPI  Discussed the use of AI scribe software for clinical note transcription with the patient, who gave verbal consent to proceed.  History of Present Illness Suhailah Gappa is a 53 year old female who presents with oral discomfort and constipation.  Oral dysesthesia and mucosal changes - Tingling and sensitive sensation in the mouth for more than one week - Initially attributed to a burn, but later observed white patches resembling thrush on the tongue during brushing - No recent use of antibiotics or inhaled medications - No oral blisters present - Concurrent upper respiratory symptoms for the past month  Chronic constipation and associated symptoms - Long-standing constipation, persistent throughout most of adult life - Current regimen includes 26 to 30 ounces of prune juice daily and Miralax , with continued difficulty achieving satisfactory bowel movements - Recent onset of fecal leakage, perianal irritation, and rectal bleeding - Previous trials of Amitiza  and Linzess  without lasting improvement - Lactulose  in small amounts provided some relief in the past  Psychosocial stressors - Significant stress over the past five years, attributed to family dynamics involving children and ex-husband - Financial constraints limiting access to therapy, though counseling is perceived as potentially beneficial   ROS     Objective:    BP 120/68   Pulse 80   Ht 5' 4 (1.626 m)   Wt 141 lb 9.6 oz (64.2 kg)   SpO2 99%   BMI 24.31 kg/m    Physical Exam Constitutional:      Appearance: Normal appearance.  HENT:     Head: Normocephalic and atraumatic.     Right Ear: Tympanic membrane, ear canal and external ear normal. There is no impacted cerumen.     Left Ear: Tympanic  membrane, ear canal and external ear normal. There is no impacted cerumen.     Nose: Nose normal.     Mouth/Throat:     Pharynx: Oropharynx is clear.  Eyes:     Conjunctiva/sclera: Conjunctivae normal.  Cardiovascular:     Rate and Rhythm: Normal rate and regular rhythm.  Pulmonary:     Effort: Pulmonary effort is normal.     Breath sounds: Normal breath sounds.  Musculoskeletal:     Cervical back: Neck supple. No tenderness.  Lymphadenopathy:     Cervical: No cervical adenopathy.  Skin:    General: Skin is warm and dry.  Neurological:     Mental Status: She is alert and oriented to person, place, and time.  Psychiatric:        Mood and Affect: Mood normal.       No results found for any visits on 05/30/24.     Assessment & Plan:   Problem List Items Addressed This Visit       Other   Constipation   Relevant Medications   lactulose  (CHRONULAC ) 10 GM/15ML solution   Other Visit Diagnoses       Oral thrush    -  Primary   Relevant Medications   nystatin  (MYCOSTATIN ) 100000 UNIT/ML suspension     Encounter for immunization       Relevant Orders   Flu vaccine trivalent PF, 6mos and older(Flulaval,Afluria,Fluarix,Fluzone) (Completed)     Stress  Assessment and Plan Assessment & Plan Oral mucosal candidiasis (suspected thrush) Suspected oral mucosal candidiasis with red patchy splotches on the tongue, no white coating. Stress unlikely to be a trigger. - Prescribed nystatin  swish and swallow, one dose midday. - Instructed to avoid eating or drinking for 15 minutes after administration. - Advised to report if symptoms do not resolve.  Chronic constipation with fecal leakage Chronic constipation with fecal leakage, likely due to retained hard stool in the colon. Previous treatments including prune juice, Miralax , and lactulose  have been tried with limited success. Stress and lifestyle factors may contribute to symptoms. - Recommended lactulose  to help  clear retained stool. - Continue prune juice and Miralax . - Advised to monitor symptoms and report if leakage persists. - Will consider abdominal plain film if symptoms do not improve.  Psychosocial stressors related to family disruption and occupational stress Significant psychosocial stressors due to family disruption and occupational stress. Stress may be contributing to bowel movement irregularities. Financial constraints limit access to therapy. - Discussed potential for employer-provided EAP services for limited therapy sessions. - Suggested exploring discounted therapy options like [facility]. - Encouraged reaching out if additional support is needed.    Meds ordered this encounter  Medications   nystatin  (MYCOSTATIN ) 100000 UNIT/ML suspension    Sig: Take 5 mLs (500,000 Units total) by mouth 3 (three) times daily. X 1 week. Swish and hold in mouth for at least 2 minutes and then swallow.    Dispense:  473 mL    Refill:  0   lactulose  (CHRONULAC ) 10 GM/15ML solution    Sig: Take 15-30 mLs (10-20 g total) by mouth 2 (two) times daily as needed for mild constipation.    Dispense:  236 mL    Refill:  0    No follow-ups on file.  Dorothyann Byars, MD [facility] at [facility]

## 2024-05-30 NOTE — Telephone Encounter (Signed)
 Patient asked to see if her daughter could become a patient due to her not needing to see a pediatrician anymore, please advise, thanks.

## 2024-05-30 NOTE — Telephone Encounter (Signed)
Left patient a detailed message about scheduled appointment.

## 2024-05-31 ENCOUNTER — Telehealth: Payer: Self-pay

## 2024-05-31 ENCOUNTER — Other Ambulatory Visit: Payer: Self-pay | Admitting: Family Medicine

## 2024-05-31 DIAGNOSIS — K5904 Chronic idiopathic constipation: Secondary | ICD-10-CM

## 2024-05-31 NOTE — Telephone Encounter (Unsigned)
 Copied from CRM #[id number]. Topic: General - Other >> May 30, 2024  4:15 PM Dedra B wrote: Reason for CRM: Pt would like a doctor's note  for today's visit and proof of her flu vaccination uploaded to MyChart.

## 2024-05-31 NOTE — Telephone Encounter (Signed)
 Notes requested by patient have been sent via MyChart in separate encounter.

## 2024-06-03 NOTE — Telephone Encounter (Signed)
Yes, I can see her 

## 2024-06-14 ENCOUNTER — Encounter: Payer: Self-pay | Admitting: Family Medicine

## 2024-06-14 ENCOUNTER — Telehealth: Payer: Self-pay

## 2024-06-14 DIAGNOSIS — G5601 Carpal tunnel syndrome, right upper limb: Secondary | ICD-10-CM

## 2024-06-14 NOTE — Telephone Encounter (Deleted)
 Copied from CRM #[id number]. Topic: Referral - Request for Referral >> Jun 13, 2024 12:41 PM Nathanel BROCKS wrote: Did the patient discuss referral with their provider in the last year? Yes Dr T (If No - schedule appointment) (If Yes - send message)  Appointment offered? Yes Dr T  Type of order/referral and detailed reason for visit: Orthopedic dr, carpeltunnel (right)  Preference of office, provider, location: [facility] or [place]  If referral order, have you been seen by this specialty before? Yes (If Yes, this issue or another issue? When? Where?Dr T  Can we respond through MyChart? Yes

## 2024-06-14 NOTE — Telephone Encounter (Signed)
 Copied from CRM #[id number]. Topic: General - Other >> Jun 13, 2024  12:41 PM [place], Nathanel CROME wrote:  Did the patient discuss referral with their provider in the last year? Yes Dr T (If No - schedule appointment) (If Yes - send message)  Appointment offered? Yes Dr T  Type of order/referral and detailed reason for visit: Orthopedic dr, carpeltunnel (right)  Preference of office, provider, location: [facility] or [place]  If referral order, have you been seen by this specialty before? Yes (If Yes, this issue or another issue? When? Where?Dr T  Can we respond through MyChart? Yes

## 2024-06-14 NOTE — Telephone Encounter (Signed)
 Ok to place referrral for ortho [facility]

## 2024-06-17 ENCOUNTER — Ambulatory Visit: Admitting: Obstetrics and Gynecology

## 2024-06-17 NOTE — Telephone Encounter (Signed)
 Referral placed -requesting ortho [place] location for carpel tunnel on right .

## 2024-06-17 NOTE — Addendum Note (Signed)
 Addended by: Tahmir Kleckner P on: 06/17/2024 08:54 AM   Modules accepted: Orders

## 2024-07-19 ENCOUNTER — Encounter: Payer: Self-pay | Admitting: Family Medicine

## 2024-07-19 DIAGNOSIS — R61 Generalized hyperhidrosis: Secondary | ICD-10-CM

## 2024-07-19 DIAGNOSIS — N951 Menopausal and female climacteric states: Secondary | ICD-10-CM

## 2024-07-22 MED ORDER — VEOZAH 45 MG PO TABS: 45.0000 mg | ORAL_TABLET | Freq: Every day | ORAL | 5 refills | Status: AC

## 2024-07-22 NOTE — Telephone Encounter (Signed)
 A prior authorization is required for Veozah  rx. Thanks in advance.

## 2024-07-23 ENCOUNTER — Telehealth: Payer: Self-pay

## 2024-07-23 ENCOUNTER — Other Ambulatory Visit (HOSPITAL_COMMUNITY): Payer: Self-pay

## 2024-07-23 NOTE — Telephone Encounter (Signed)
 Pharmacy Patient Advocate Encounter  Received notification from [facility] [place] that Prior Authorization for  Veozah  45MG  tablets  has been APPROVED from 07/23/24 to 07/23/25. Ran test claim, Copay is $100. This test claim was processed through [facility]- copay amounts may vary at other pharmacies due to pharmacy/plan contracts, or as the patient moves through the different stages of their insurance plan.   PA #/Case ID/Reference #: [id number]

## 2024-07-23 NOTE — Telephone Encounter (Signed)
 Pharmacy Patient Advocate Encounter   Received notification from Physician's Office that prior authorization for Veozah  45MG  tablets is required/requested.   Insurance verification completed.   The patient is insured through [employer] [place] .   Per test claim: PA required; PA started via CoverMyMeds. KEY [id number] . Waiting for clinical questions to populate.

## 2024-07-29 NOTE — Telephone Encounter (Signed)
 This task has been completed as requested. Please review other TE for additional information.   Patient notified of the update. No further action needed.

## 2024-08-14 ENCOUNTER — Telehealth: Payer: Self-pay | Admitting: *Deleted

## 2024-08-14 DIAGNOSIS — R61 Generalized hyperhidrosis: Secondary | ICD-10-CM

## 2024-08-14 MED ORDER — VENLAFAXINE HCL 37.5 MG PO TABS: 112.5000 mg | ORAL_TABLET | Freq: Every day | ORAL | 1 refills | Status: AC

## 2024-08-14 NOTE — Telephone Encounter (Signed)
 error

## 2024-08-27 ENCOUNTER — Other Ambulatory Visit: Payer: Self-pay | Admitting: Family Medicine

## 2024-08-27 DIAGNOSIS — F5101 Primary insomnia: Secondary | ICD-10-CM
